# Patient Record
Sex: Female | Born: 1980 | Race: White | Hispanic: No | Smoking: Never smoker
Health system: Southern US, Community
[De-identification: ages and names within clinical notes are randomized; demographics above are authoritative.]

## PROBLEM LIST (undated history)

## (undated) DIAGNOSIS — R0600 Dyspnea, unspecified: Secondary | ICD-10-CM

## (undated) DIAGNOSIS — R32 Unspecified urinary incontinence: Secondary | ICD-10-CM

## (undated) DIAGNOSIS — E785 Hyperlipidemia, unspecified: Secondary | ICD-10-CM

## (undated) DIAGNOSIS — K219 Gastro-esophageal reflux disease without esophagitis: Secondary | ICD-10-CM

## (undated) DIAGNOSIS — R7303 Prediabetes: Secondary | ICD-10-CM

## (undated) DIAGNOSIS — N816 Rectocele: Secondary | ICD-10-CM

## (undated) DIAGNOSIS — F419 Anxiety disorder, unspecified: Secondary | ICD-10-CM

## (undated) DIAGNOSIS — E282 Polycystic ovarian syndrome: Secondary | ICD-10-CM

## (undated) DIAGNOSIS — N83202 Unspecified ovarian cyst, left side: Secondary | ICD-10-CM

## (undated) DIAGNOSIS — J45909 Unspecified asthma, uncomplicated: Secondary | ICD-10-CM

## (undated) DIAGNOSIS — D509 Iron deficiency anemia, unspecified: Secondary | ICD-10-CM

## (undated) DIAGNOSIS — E559 Vitamin D deficiency, unspecified: Secondary | ICD-10-CM

## (undated) DIAGNOSIS — E781 Pure hyperglyceridemia: Secondary | ICD-10-CM

## (undated) DIAGNOSIS — N8110 Cystocele, unspecified: Secondary | ICD-10-CM

## (undated) DIAGNOSIS — K449 Diaphragmatic hernia without obstruction or gangrene: Secondary | ICD-10-CM

## (undated) DIAGNOSIS — R600 Localized edema: Secondary | ICD-10-CM

## (undated) DIAGNOSIS — T7840XA Allergy, unspecified, initial encounter: Secondary | ICD-10-CM

## (undated) DIAGNOSIS — E786 Lipoprotein deficiency: Secondary | ICD-10-CM

## (undated) HISTORY — DX: Diaphragmatic hernia without obstruction or gangrene: K44.9

## (undated) HISTORY — DX: Unspecified urinary incontinence: R32

## (undated) HISTORY — DX: Prediabetes: R73.03

## (undated) HISTORY — DX: Hyperlipidemia, unspecified: E78.5

## (undated) HISTORY — PX: REDUCTION MAMMAPLASTY: SUR839

## (undated) HISTORY — DX: Gastro-esophageal reflux disease without esophagitis: K21.9

## (undated) HISTORY — DX: Localized edema: R60.0

## (undated) HISTORY — DX: Polycystic ovarian syndrome: E28.2

## (undated) HISTORY — DX: Dyspnea, unspecified: R06.00

## (undated) HISTORY — DX: Anxiety disorder, unspecified: F41.9

## (undated) HISTORY — DX: Cystocele, unspecified: N81.10

## (undated) HISTORY — DX: Lipoprotein deficiency: E78.6

## (undated) HISTORY — DX: Allergy, unspecified, initial encounter: T78.40XA

## (undated) HISTORY — DX: Vitamin D deficiency, unspecified: E55.9

## (undated) HISTORY — DX: Unspecified ovarian cyst, left side: N83.202

## (undated) HISTORY — DX: Iron deficiency anemia, unspecified: D50.9

## (undated) HISTORY — DX: Unspecified asthma, uncomplicated: J45.909

## (undated) HISTORY — DX: Pure hyperglyceridemia: E78.1

## (undated) HISTORY — DX: Rectocele: N81.6

---
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
## 1998-03-01 HISTORY — PX: BREAST REDUCTION SURGERY: SHX8

## 1998-08-25 ENCOUNTER — Other Ambulatory Visit: Admission: RE | Admit: 1998-08-25 | Discharge: 1998-08-25 | Payer: Self-pay | Admitting: Obstetrics and Gynecology

## 1999-02-13 ENCOUNTER — Encounter (INDEPENDENT_AMBULATORY_CARE_PROVIDER_SITE_OTHER): Payer: Self-pay | Admitting: Specialist

## 1999-02-13 ENCOUNTER — Other Ambulatory Visit: Admission: RE | Admit: 1999-02-13 | Discharge: 1999-02-13 | Payer: Self-pay | Admitting: Plastic Surgery

## 2002-06-26 ENCOUNTER — Encounter: Payer: Self-pay | Admitting: Obstetrics and Gynecology

## 2002-06-26 ENCOUNTER — Encounter: Admission: RE | Admit: 2002-06-26 | Discharge: 2002-06-26 | Payer: Self-pay | Admitting: Obstetrics and Gynecology

## 2003-05-27 ENCOUNTER — Other Ambulatory Visit: Admission: RE | Admit: 2003-05-27 | Discharge: 2003-05-27 | Payer: Self-pay | Admitting: Obstetrics and Gynecology

## 2003-06-12 ENCOUNTER — Encounter: Admission: RE | Admit: 2003-06-12 | Discharge: 2003-09-10 | Payer: Self-pay

## 2004-06-10 ENCOUNTER — Other Ambulatory Visit: Admission: RE | Admit: 2004-06-10 | Discharge: 2004-06-10 | Payer: Self-pay | Admitting: Obstetrics and Gynecology

## 2004-07-07 ENCOUNTER — Ambulatory Visit (HOSPITAL_COMMUNITY): Admission: RE | Admit: 2004-07-07 | Discharge: 2004-07-07 | Payer: Self-pay | Admitting: Obstetrics and Gynecology

## 2004-07-20 ENCOUNTER — Ambulatory Visit (HOSPITAL_COMMUNITY): Admission: RE | Admit: 2004-07-20 | Discharge: 2004-07-20 | Payer: Self-pay | Admitting: Obstetrics and Gynecology

## 2005-06-10 ENCOUNTER — Other Ambulatory Visit: Admission: RE | Admit: 2005-06-10 | Discharge: 2005-06-10 | Payer: Self-pay | Admitting: Obstetrics and Gynecology

## 2006-06-13 ENCOUNTER — Other Ambulatory Visit: Admission: RE | Admit: 2006-06-13 | Discharge: 2006-06-13 | Payer: Self-pay | Admitting: Obstetrics and Gynecology

## 2007-05-15 ENCOUNTER — Inpatient Hospital Stay (HOSPITAL_COMMUNITY): Admission: AD | Admit: 2007-05-15 | Discharge: 2007-05-17 | Payer: Self-pay | Admitting: Obstetrics and Gynecology

## 2007-11-03 ENCOUNTER — Other Ambulatory Visit: Admission: RE | Admit: 2007-11-03 | Discharge: 2007-11-03 | Payer: Self-pay | Admitting: Obstetrics and Gynecology

## 2007-11-17 ENCOUNTER — Encounter: Admission: RE | Admit: 2007-11-17 | Discharge: 2007-11-17 | Payer: Self-pay | Admitting: Obstetrics and Gynecology

## 2008-05-06 ENCOUNTER — Ambulatory Visit: Payer: Self-pay | Admitting: Internal Medicine

## 2008-11-08 ENCOUNTER — Other Ambulatory Visit: Admission: RE | Admit: 2008-11-08 | Discharge: 2008-11-08 | Payer: Self-pay | Admitting: Obstetrics and Gynecology

## 2009-08-08 ENCOUNTER — Inpatient Hospital Stay (HOSPITAL_COMMUNITY): Admission: AD | Admit: 2009-08-08 | Discharge: 2009-08-08 | Payer: Self-pay | Admitting: Obstetrics and Gynecology

## 2009-08-08 ENCOUNTER — Ambulatory Visit: Payer: Self-pay | Admitting: Gynecology

## 2009-11-04 ENCOUNTER — Inpatient Hospital Stay (HOSPITAL_COMMUNITY): Admission: AD | Admit: 2009-11-04 | Discharge: 2009-11-06 | Payer: Self-pay | Admitting: Obstetrics and Gynecology

## 2009-11-13 ENCOUNTER — Emergency Department (HOSPITAL_COMMUNITY): Admission: EM | Admit: 2009-11-13 | Discharge: 2009-11-13 | Payer: Self-pay | Admitting: Emergency Medicine

## 2010-02-17 ENCOUNTER — Other Ambulatory Visit
Admission: RE | Admit: 2010-02-17 | Discharge: 2010-02-17 | Payer: Self-pay | Source: Home / Self Care | Admitting: Obstetrics and Gynecology

## 2010-03-22 ENCOUNTER — Encounter: Payer: Self-pay | Admitting: Obstetrics and Gynecology

## 2010-05-14 LAB — COMPREHENSIVE METABOLIC PANEL
AST: 17 U/L (ref 0–37)
Albumin: 3.2 g/dL — ABNORMAL LOW (ref 3.5–5.2)
Alkaline Phosphatase: 107 U/L (ref 39–117)
Chloride: 108 mEq/L (ref 96–112)
GFR calc Af Amer: >60 mL/min (ref >60)
Potassium: 3.5 mEq/L (ref 3.5–5.1)
Total Bilirubin: 0.5 mg/dL (ref 0.3–1.2)

## 2010-05-14 LAB — DIFFERENTIAL
Basophils Absolute: 0.1 10*3/uL (ref 0.0–0.1)
Basophils Relative: 1 % (ref 0–1)
Eosinophils Relative: 2 % (ref 0–5)
Lymphocytes Relative: 11 % — ABNORMAL LOW (ref 12–46)
Monocytes Absolute: 1.1 10*3/uL — ABNORMAL HIGH (ref 0.1–1.0)

## 2010-05-14 LAB — CBC
HCT: 36.8 % (ref 36.0–46.0)
MCV: 90.3 fL (ref 78.0–100.0)
Platelets: 183 10*3/uL (ref 150–400)
Platelets: 150 10*3/uL (ref 150–400)
RBC: 4.47 MIL/uL (ref 3.87–5.11)
RBC: 3.73 MIL/uL — ABNORMAL LOW (ref 3.87–5.11)
RDW: 15.6 % — ABNORMAL HIGH (ref 11.5–15.5)
WBC: 10.2 10*3/uL (ref 4.0–10.5)
WBC: 10.8 10*3/uL — ABNORMAL HIGH (ref 4.0–10.5)
WBC: 9.9 10*3/uL (ref 4.0–10.5)

## 2010-05-14 LAB — RPR: RPR Ser Ql: NONREACTIVE

## 2010-05-14 LAB — WET PREP, GENITAL: Yeast Wet Prep HPF POC: NONE SEEN

## 2010-05-14 LAB — URINALYSIS, ROUTINE W REFLEX MICROSCOPIC
Bilirubin Urine: NEGATIVE
Glucose, UA: NEGATIVE mg/dL
Ketones, ur: NEGATIVE mg/dL
pH: 6.5 (ref 5.0–8.0)

## 2010-05-14 LAB — URINE MICROSCOPIC-ADD ON

## 2010-05-18 LAB — WET PREP, GENITAL
Clue Cells Wet Prep HPF POC: NONE SEEN
Trich, Wet Prep: NONE SEEN
Yeast Wet Prep HPF POC: NONE SEEN

## 2010-07-14 NOTE — Op Note (Signed)
NAMEZYNIAH, FERRAIOLO      ACCOUNT NO.:  [id number]   MEDICAL RECORD NO.:  [id number]          PATIENT TYPE:  INP   LOCATION:  9170                          FACILITY:  [facility]   PHYSICIAN:  Charles A. Delcambre, MDDATE OF BIRTH:  07/29/1980   DATE OF PROCEDURE:  05/15/2007  DATE OF DISCHARGE:                               OPERATIVE REPORT   __________  Clear fluid was noted.  She was __________  Pitocin.  __________  complete, roughly 10 __________  and delivery record.  At  that time she was complete, complete, and +1 to +2 in occipitoposterior.  The fetal heart rate was 140s __________  some variables __________.  She pushed to a +2 station to +3 station, but decelerations were  occurring which were progressively worse with pushing __________ .  For  this reason, __________.  __________  shoulders __________.  The bladder  had been emptied __________ .  The remainder of the baby __________ .  __________  2-0 Vicryl.  __________ .      Charles A. Sydnee Cabal, MD  Electronically Signed     CAD/MEDQ  D:  05/15/2007  T:  05/16/2007  Job:  [id number]

## 2010-07-14 NOTE — H&P (Signed)
Alexis Jones, Alexis Jones      ACCOUNT NO.:  [id number]   MEDICAL RECORD NO.:  [id number]          PATIENT TYPE:  INP   LOCATION:                                [facility]  [facility]   PHYSICIAN:  Charles A. Delcambre, MD    DATE OF BIRTH:   DATE OF ADMISSION:  05/15/2007  DATE OF DISCHARGE:                              HISTORY & PHYSICAL   REASON FOR ADMISSION:  The patient to be admitted on May 17, 2007, for  Cervidil induction at 9 days overdue.   HISTORY OF PRESENT ILLNESS:  She is a 30 year old gravida 2, para 0-0-1-  0.  Prenatal labs illnesses consist of blood type O+, antibody screen  negative, VDRL nonreactive, rubella immune, hepatitis B surface antigen  negative, HIV nonreactive.  TSH 0.57, normal.  Urinalysis normal.  Pap  negative April 2008. Gonorrhea and Chlamydia October 26, 2006, both  negative.  Cystic fibrosis negative in 2006.  She declined first  trimester screen.  She also declined quad screen.  One-hour glucose 84  at 28 weeks, hemoglobin 11.6 at 28 weeks.  Syphilis negative at 28  weeks.  HIV negative at 36 weeks.  Group B strep negative at 36 weeks.   PAST MEDICAL HISTORY:  Asthma in childhood, not currently.   PAST SURGICAL HISTORY:  Breast reduction December 2000.   MEDICATIONS:  Prenatal vitamins, iron, recently Z-Pak completed for  bronchitis.   ALLERGIES:  NKDA   SOCIAL HISTORY:  No tobacco, ethanol or drug use.  Patient is married  and in a monogamous relationship with her husband.   FAMILY HISTORY:  Paternal grandfather lung cancer.  Maternal grandmother  ovarian cancer.  Heart disease in paternal grandfather and father,  thrrombophlebitis in maternal grandfather, COPD maternal grandfather,  and lung cancer in maternal grandfather.   REVIEW OF SYSTEMS:  Denies ruptured membranes, regular contractions or  vaginal bleeding, headaches, vision changes, right upper quadrant pain,  chest pain, shortness of breath, wheezing, diarrhea, constipation.   PHYSICAL EXAMINATION:  GENERAL APPEARANCE:  Alert and oriented x3.  VITAL SIGNS:  Blood pressure 112/72, weight 217 pounds, respirations 16,  pulse 90.  CORONARY:  Regular rate and rhythm, 2/6 systolic ejection murmur left  sternal border.  LUNGS: Clear bilaterally.  ABDOMEN:  Fundal height 40 cm. Fetal heart rate 145, fundal height 40  cm.  PELVIC:  Cervix fingertip, posterior, soft, 50% effaced and intact.  EXTREMITIES:  Moderate edema to the upper calves, symmetrical  bilaterally and nontender.   ASSESSMENT:  The patient to be 41 weeks 2 days upon admission, to be  induced for post dates.  She gives informed consent.   PLAN:  Cervidil will be placed overnight, Ambien 10 mg if desired.  If  unable to place Cervidil secondary to contractions, will run Pitocin at  2 mU per minute overnight.  Pitocin in the morning at 0700.  Otherwise  Cervidil will be left until about 0800, thereafter removed and possible  artificial rupture of membranes if she is 2-3 cm dilated.  She gives  informed consent and we will proceed as outlined.      Charles A. Sydnee Cabal, MD  Electronically Signed     CAD/MEDQ  D:  05/10/2007  T:  05/11/2007  Job:  [id number]

## 2010-07-17 NOTE — Op Note (Signed)
Alexis Jones, Alexis Jones      ACCOUNT NO.:  [id number]   MEDICAL RECORD NO.:  [id number]          PATIENT TYPE:  INP   LOCATION:  9124                          [facility]ACILITY:  [facility]   PHYSICIAN:  Charles A. Delcambre, MDDATE OF BIRTH:  04/01/1980   DATE OF PROCEDURE:  DATE OF DISCHARGE:  05/17/2007                               OPERATIVE REPORT   This is a redictation in that the first dictation had a lot of static  and was not transcribable except in some infrequent areas giving an  inadequate operative vaginal delivery note. I wish to redictate as  follows to the best of my knowledge.   [name] came in in spontaneous labor, progressed on through second  stage where she developed acutely deep decelerations lasting 2 minutes  to the 70s with several contractions were she was pushing.  She gave  informed consent at that time for vacuum delivery, accepted risks of  subgaleal hemorrhage, excoriation on the baby's head, failure of vacuum  extraction leading to cesarean section and the indication being the  nonreassuring fetal heart rate.  She gave informed consent. She was  complete complete,  +2 at that time. The bladder was emptied from her  Foley that had just been discontinued,  epidural was then placed. The  baby was right occiput anterior. A kiwi vacuum was used,  placed and  pressure brought up to the mid green zone in one contraction.  She had  three pushes and the baby was delivered without difficulty. The  remainder of the delivery proceeded without complication. The shoulders  delivered spontaneously, baby had a vigorous cry. It was 8 pounds 1  ounce.  Apgars of 8 and 9. As this is a redictation, please verify  weight, length and Apgars with the medical record chart. I do not recall  if cord gases were sent. Length was 20-3/4 to my memory. The placenta  was manually expressed and sent for cord blood donation to [facility]. The father of the baby cut the cord. A  small second-degree  laceration intravaginally was noted, repaired with 3-0 Vicryl running  locking suture. Estimated blood loss was 400 mL. Mother and baby are  stable at time of discharge to postpartum.      Charles A. Sydnee Cabal, MD  Electronically Signed     CAD/MEDQ  D:  05/18/2007  T:  05/18/2007  Job:  [id number]

## 2010-11-23 LAB — CBC
Platelets: 200
Platelets: 206
RBC: 3.63 — ABNORMAL LOW
RDW: 15
WBC: 16.2 — ABNORMAL HIGH

## 2010-11-23 LAB — SYPHILIS: RPR W/REFLEX TO RPR TITER AND TREPONEMAL ANTIBODIES, TRADITIONAL SCREENING AND DIAGNOSIS ALGORITHM: RPR Ser Ql: NONREACTIVE

## 2011-02-09 ENCOUNTER — Other Ambulatory Visit (HOSPITAL_COMMUNITY)
Admission: RE | Admit: 2011-02-09 | Discharge: 2011-02-09 | Disposition: A | Discharge status: Home / Self Care | Payer: BC Managed Care – PPO | Source: Ambulatory Visit | Attending: Obstetrics and Gynecology | Admitting: Obstetrics and Gynecology

## 2011-02-09 DIAGNOSIS — Z01419 Encounter for gynecological examination (general) (routine) without abnormal findings: Secondary | ICD-10-CM | POA: Insufficient documentation

## 2012-02-28 ENCOUNTER — Other Ambulatory Visit (HOSPITAL_COMMUNITY)
Admission: RE | Admit: 2012-02-28 | Discharge: 2012-02-28 | Disposition: A | Discharge status: Home / Self Care | Payer: BC Managed Care – PPO | Source: Ambulatory Visit | Attending: Obstetrics and Gynecology | Admitting: Obstetrics and Gynecology

## 2012-02-28 ENCOUNTER — Other Ambulatory Visit: Payer: Self-pay | Admitting: Obstetrics and Gynecology

## 2012-02-28 DIAGNOSIS — Z113 Encounter for screening for infections with a predominantly sexual mode of transmission: Secondary | ICD-10-CM | POA: Insufficient documentation

## 2012-02-28 DIAGNOSIS — Z01419 Encounter for gynecological examination (general) (routine) without abnormal findings: Secondary | ICD-10-CM | POA: Insufficient documentation

## 2012-02-28 DIAGNOSIS — N760 Acute vaginitis: Secondary | ICD-10-CM | POA: Insufficient documentation

## 2013-03-01 HISTORY — PX: LIPOMA EXCISION: SHX5283

## 2013-03-22 ENCOUNTER — Other Ambulatory Visit (HOSPITAL_COMMUNITY)
Admission: RE | Admit: 2013-03-22 | Discharge: 2013-03-22 | Disposition: A | Discharge status: Home / Self Care | Payer: BC Managed Care – PPO | Source: Ambulatory Visit | Attending: Obstetrics and Gynecology | Admitting: Obstetrics and Gynecology

## 2013-03-22 ENCOUNTER — Other Ambulatory Visit: Payer: Self-pay | Admitting: Obstetrics and Gynecology

## 2013-03-22 DIAGNOSIS — Z01419 Encounter for gynecological examination (general) (routine) without abnormal findings: Secondary | ICD-10-CM | POA: Insufficient documentation

## 2013-03-22 DIAGNOSIS — Z1151 Encounter for screening for human papillomavirus (HPV): Secondary | ICD-10-CM | POA: Insufficient documentation

## 2013-04-09 ENCOUNTER — Ambulatory Visit (INDEPENDENT_AMBULATORY_CARE_PROVIDER_SITE_OTHER): Payer: BC Managed Care – PPO | Admitting: Surgery

## 2013-04-17 ENCOUNTER — Ambulatory Visit (INDEPENDENT_AMBULATORY_CARE_PROVIDER_SITE_OTHER): Payer: BC Managed Care – PPO | Admitting: Surgery

## 2013-04-17 ENCOUNTER — Encounter (INDEPENDENT_AMBULATORY_CARE_PROVIDER_SITE_OTHER): Payer: Self-pay | Admitting: Surgery

## 2013-04-17 VITALS — BP 110/70 | HR 70 | Temp 98.7°F | Resp 14 | Ht 66.0 in | Wt 211.6 lb

## 2013-04-17 DIAGNOSIS — M7989 Other specified soft tissue disorders: Secondary | ICD-10-CM

## 2013-04-17 DIAGNOSIS — M799 Soft tissue disorder, unspecified: Secondary | ICD-10-CM

## 2013-04-17 NOTE — Patient Instructions (Signed)

## 2013-04-17 NOTE — Progress Notes (Signed)
General Surgery [facility]  Chief Complaint  Patient presents with  . New Evaluation    mass on back - referral from Dr. Christophe Louis, [facility]    HISTORY: Patient is a 33 year old female referred by her gynecologist with a soft tissue mass of the left posterior shoulder. Mass arises along the edge of the left trapezius muscle. Patient states that it is been present for several years but over the past 12-18 months has increased significantly in size. She suspects it is double the size it was 1 year ago. He does not cause any significant discomfort. It does not limit her range of motion. She denies any radiation of pain from the neck or into the upper extremity. Patient has had no such lesions previously excised. She denies any history of trauma. Patient presents at this time for consideration for surgical excision for definitive diagnosis and management.  History reviewed. No pertinent past medical history.  Current Outpatient Prescriptions  Medication Sig Dispense Refill  . levonorgestrel (MIRENA) 20 MCG/24HR IUD 1 each by Intrauterine route once.       No current facility-administered medications for this visit.    No Known Allergies  Family History  Problem Relation Age of Onset  . Breast cancer Maternal Aunt   . Ovarian cancer Maternal Grandmother   . Skin cancer Maternal Grandfather   . Lung cancer Maternal Grandfather     History   Social History  . Marital Status: Married    Spouse Name: N/A    Number of Children: N/A  . Years of Education: N/A   Social History Main Topics  . Smoking status: Never Smoker   . Smokeless tobacco: None  . Alcohol Use: No  . Drug Use: No  . Sexual Activity: None   Other Topics Concern  . None   Social History Narrative  . None    REVIEW OF SYSTEMS - PERTINENT POSITIVES ONLY: Increase in size over past 12-18 months as noted above. Denies pain. Denies limitation in range of motion.  EXAM: Filed Vitals:   04/17/13 1539  BP: 110/70  Pulse: 70  Temp: 98.7 F (37.1 C)  Resp: 14    GENERAL: well-developed, well-nourished, no acute distress HEENT: normocephalic; pupils equal and reactive; sclerae clear; dentition good; mucous membranes moist NECK:  symmetric on extension; no palpable anterior or posterior cervical lymphadenopathy; no supraclavicular masses; no tenderness CHEST: clear to auscultation bilaterally without rales, rhonchi, or wheezes; soft tissue mass left shoulder along edge of trapezius muscle measuring 6 x 6 x 3 cm with relatively well-defined margins, somewhat mobile CARDIAC: regular rate and rhythm without significant murmur; peripheral pulses are full EXT:  non-tender without edema; no deformity NEURO: no gross focal deficits; no sign of tremor   LABORATORY RESULTS: See [facility] ([facility]-Epic) for most recent results  RADIOLOGY RESULTS: See [facility] (CHL-Epic) for most recent results  IMPRESSION: Soft tissue mass left posterior shoulder, 6 x 6 x 3 cm, likely lipoma  PLAN: Patient and I discussed the indications for surgical removal. Given that a significant increase in size has occurred over the past year, I believe this should be removed for definitive diagnosis. It is likely benign. It does not appear to involve any underlying neural structures.  Patient and I discussed surgical excision as an outpatient procedure. We discussed the location of the surgical incision. We discussed the cosmetic results to be anticipated. We discussed the possibility of seroma formation. She understands and wishes to proceed.  The risks and benefits of the procedure have been discussed at length with the patient.  The patient understands the proposed procedure, potential alternative treatments, and the course of recovery to be expected.  All of the patient's questions have been answered at this time.  The patient wishes to proceed with surgery.  Earnstine Regal, MD, [facility]  Primary Care Physician: Catha Brow., MD

## 2013-04-27 ENCOUNTER — Telehealth (INDEPENDENT_AMBULATORY_CARE_PROVIDER_SITE_OTHER): Payer: Self-pay

## 2013-04-27 ENCOUNTER — Other Ambulatory Visit (INDEPENDENT_AMBULATORY_CARE_PROVIDER_SITE_OTHER): Payer: Self-pay | Admitting: *Deleted

## 2013-04-27 DIAGNOSIS — D1739 Benign lipomatous neoplasm of skin and subcutaneous tissue of other sites: Secondary | ICD-10-CM

## 2013-04-27 MED ORDER — HYDROCODONE-ACETAMINOPHEN 5-325 MG PO TABS: 1.0000 | ORAL_TABLET | ORAL | Status: DC | PRN

## 2013-04-27 NOTE — Telephone Encounter (Signed)
Pt home doing well. PO appt made. 

## 2013-05-08 ENCOUNTER — Telehealth (INDEPENDENT_AMBULATORY_CARE_PROVIDER_SITE_OTHER): Payer: Self-pay | Admitting: *Deleted

## 2013-05-08 NOTE — Telephone Encounter (Signed)
Pt called, incision is painful to touch/red around posterior/pocket of fluid.I spoke to [place] and she said have pt come in 3.11.15 9:15 for 9:30 apt with TG.. Called left pt a message at work to call and verify that she is able to keep this appt.Marland Kitchenjkw

## 2013-05-09 ENCOUNTER — Ambulatory Visit (INDEPENDENT_AMBULATORY_CARE_PROVIDER_SITE_OTHER): Payer: BC Managed Care – PPO | Admitting: Surgery

## 2013-05-09 ENCOUNTER — Encounter (INDEPENDENT_AMBULATORY_CARE_PROVIDER_SITE_OTHER): Payer: Self-pay | Admitting: Surgery

## 2013-05-09 VITALS — BP 104/70 | HR 80 | Temp 98.5°F | Resp 14 | Ht 66.0 in | Wt 210.8 lb

## 2013-05-09 DIAGNOSIS — M7989 Other specified soft tissue disorders: Secondary | ICD-10-CM

## 2013-05-09 DIAGNOSIS — M799 Soft tissue disorder, unspecified: Secondary | ICD-10-CM

## 2013-05-09 NOTE — Progress Notes (Signed)
General Surgery [facility]  Chief Complaint  Patient presents with  . Routine Post Op    excision lipoma left shoulder 04/27/2013    HISTORY: Patient is a 33 year old female who underwent excision of a 7.6 cm lipoma from the left shoulder. She returns for her first postoperative visit.  EXAM: Wound is healing nicely. No sign of infection. There is a small seroma. 10 cc of serous fluid is aspirated through a 21-gauge needle. Band-Aid is placed as dressing.  IMPRESSION: Status post excisional lipoma left shoulder, postoperative small seroma.  PLAN: Patient will begin applying topical creams to the incision. She will return for surgical care as needed. Final pathology report was given to the patient.  Alexis Regal, MD, [facility]   Visit Diagnoses: 1. Soft tissue mass, left shoulder

## 2013-05-09 NOTE — Patient Instructions (Signed)
  COCOA BUTTER & VITAMIN E CREAM  ([employer] or other brand)  Apply cocoa butter/vitamin E cream to your incision 2 - 3 times daily.  Massage cream into incision for one minute with each application.  Use sunscreen (50 SPF or higher) for first 6 months after surgery if area is exposed to sun.  You may substitute Mederma or other scar reducing creams as desired.   

## 2013-05-22 ENCOUNTER — Encounter (INDEPENDENT_AMBULATORY_CARE_PROVIDER_SITE_OTHER): Payer: BC Managed Care – PPO | Admitting: Surgery

## 2013-05-30 ENCOUNTER — Encounter (INDEPENDENT_AMBULATORY_CARE_PROVIDER_SITE_OTHER): Payer: BC Managed Care – PPO | Admitting: Surgery

## 2014-03-27 ENCOUNTER — Other Ambulatory Visit (HOSPITAL_COMMUNITY)
Admission: RE | Admit: 2014-03-27 | Discharge: 2014-03-27 | Disposition: A | Discharge status: Home / Self Care | Payer: BC Managed Care – PPO | Source: Ambulatory Visit | Attending: Obstetrics and Gynecology | Admitting: Obstetrics and Gynecology

## 2014-03-27 ENCOUNTER — Other Ambulatory Visit: Payer: Self-pay | Admitting: Obstetrics and Gynecology

## 2014-03-27 DIAGNOSIS — Z01411 Encounter for gynecological examination (general) (routine) with abnormal findings: Secondary | ICD-10-CM | POA: Insufficient documentation

## 2014-03-29 LAB — CYTOLOGY - PAP

## 2015-04-10 ENCOUNTER — Other Ambulatory Visit: Payer: Self-pay | Admitting: Obstetrics and Gynecology

## 2015-04-10 ENCOUNTER — Other Ambulatory Visit (HOSPITAL_COMMUNITY)
Admission: RE | Admit: 2015-04-10 | Discharge: 2015-04-10 | Disposition: A | Discharge status: Home / Self Care | Payer: BC Managed Care – PPO | Source: Ambulatory Visit | Attending: Obstetrics and Gynecology | Admitting: Obstetrics and Gynecology

## 2015-04-10 DIAGNOSIS — Z01419 Encounter for gynecological examination (general) (routine) without abnormal findings: Secondary | ICD-10-CM | POA: Diagnosis present

## 2015-04-14 LAB — CYTOLOGY - PAP

## 2015-10-03 ENCOUNTER — Other Ambulatory Visit: Payer: Self-pay | Admitting: Surgery

## 2016-04-27 ENCOUNTER — Other Ambulatory Visit: Payer: Self-pay | Admitting: Obstetrics and Gynecology

## 2016-04-27 ENCOUNTER — Other Ambulatory Visit (HOSPITAL_COMMUNITY)
Admission: RE | Admit: 2016-04-27 | Discharge: 2016-04-27 | Disposition: A | Discharge status: Home / Self Care | Payer: BC Managed Care – PPO | Source: Ambulatory Visit | Attending: Obstetrics and Gynecology | Admitting: Obstetrics and Gynecology

## 2016-04-27 DIAGNOSIS — Z01419 Encounter for gynecological examination (general) (routine) without abnormal findings: Secondary | ICD-10-CM | POA: Insufficient documentation

## 2016-04-27 DIAGNOSIS — Z1151 Encounter for screening for human papillomavirus (HPV): Secondary | ICD-10-CM | POA: Insufficient documentation

## 2016-04-30 LAB — CYTOLOGY - PAP
Diagnosis: NEGATIVE
HPV (WINDOPATH): NOT DETECTED

## 2016-10-12 ENCOUNTER — Other Ambulatory Visit: Payer: Self-pay | Admitting: Physician Assistant

## 2016-10-12 DIAGNOSIS — R1310 Dysphagia, unspecified: Secondary | ICD-10-CM

## 2016-10-14 ENCOUNTER — Ambulatory Visit
Admission: RE | Admit: 2016-10-14 | Discharge: 2016-10-14 | Disposition: A | Discharge status: Home / Self Care | Payer: BC Managed Care – PPO | Source: Ambulatory Visit | Attending: Physician Assistant | Admitting: Physician Assistant

## 2016-10-14 DIAGNOSIS — R1310 Dysphagia, unspecified: Secondary | ICD-10-CM

## 2018-04-24 ENCOUNTER — Other Ambulatory Visit: Payer: Self-pay

## 2018-04-24 ENCOUNTER — Ambulatory Visit: Payer: BC Managed Care – PPO | Admitting: Obstetrics & Gynecology

## 2018-04-24 ENCOUNTER — Other Ambulatory Visit (HOSPITAL_COMMUNITY)
Admission: RE | Admit: 2018-04-24 | Discharge: 2018-04-24 | Disposition: A | Discharge status: Home / Self Care | Payer: BC Managed Care – PPO | Source: Ambulatory Visit | Attending: Obstetrics & Gynecology | Admitting: Obstetrics & Gynecology

## 2018-04-24 ENCOUNTER — Telehealth: Payer: Self-pay | Admitting: Obstetrics & Gynecology

## 2018-04-24 ENCOUNTER — Encounter: Payer: Self-pay | Admitting: Obstetrics & Gynecology

## 2018-04-24 VITALS — BP 118/62 | HR 88 | Resp 16 | Ht 66.0 in | Wt 254.4 lb

## 2018-04-24 DIAGNOSIS — Z124 Encounter for screening for malignant neoplasm of cervix: Secondary | ICD-10-CM | POA: Diagnosis not present

## 2018-04-24 DIAGNOSIS — T8332XA Displacement of intrauterine contraceptive device, initial encounter: Secondary | ICD-10-CM | POA: Diagnosis not present

## 2018-04-24 DIAGNOSIS — Z01419 Encounter for gynecological examination (general) (routine) without abnormal findings: Secondary | ICD-10-CM

## 2018-04-24 DIAGNOSIS — Z23 Encounter for immunization: Secondary | ICD-10-CM

## 2018-04-24 DIAGNOSIS — Z0000 Encounter for general adult medical examination without abnormal findings: Secondary | ICD-10-CM | POA: Diagnosis not present

## 2018-04-24 MED ORDER — ESCITALOPRAM OXALATE 10 MG PO TABS: 10.0000 mg | ORAL_TABLET | Freq: Every day | ORAL | 4 refills | Status: DC

## 2018-04-24 MED ORDER — OMEPRAZOLE 40 MG PO CPDR: 40.0000 mg | DELAYED_RELEASE_CAPSULE | Freq: Every day | ORAL | 3 refills | Status: DC

## 2018-04-24 NOTE — Telephone Encounter (Signed)
Call placed to patient to review benefits to review benefits for scheduled ultrasound appointment. Left voicemail message requesting a return call.

## 2018-04-24 NOTE — Progress Notes (Signed)
Patient scheduled while in office for PUS on 04/27/18 at 9am, consult to follow at 9:30am with Dr. Quincy Simmonds. Patient verbalizes understanding and is agreeable.

## 2018-04-24 NOTE — Progress Notes (Signed)
38 y.o. [id number] Married White or Caucasian female here for new patient annual exam.  Has Mirena IUD for contraception.  Occ has some dischage.  H/O PCOS so never had regular cycles.  Was on Clomid for pregnancies.  H/O GERD.  Saw Noel Redmon.  Had barium swallow done showing hiatal hernia and reflux.  Having issues with urinary leakage with coughing, lifting, sneezing and squatting.  Sometimes feels pressure as well.  Occasionally has urinary urgency and sometimes can't get to bathroom fast enough.  Had two vaginal deliveries with infants >8 pounds.   No LMP recorded. (Menstrual status: IUD).          Sexually active: Yes.    The current method of family planning is IUD. Mirena placed 05/05/15.   Exercising: No.   Smoker:  no  Health Maintenance: Pap:  04/27/16 neg. HR HPV:neg  History of abnormal Pap:  no MMG:  Never TDaP:  Grad school Screening Labs: here today    reports that she has never smoked. She has never used smokeless tobacco. She reports that she does not drink alcohol or use drugs.  Past Medical History:  Diagnosis Date  . Anxiety   . Urinary incontinence     Past Surgical History:  Procedure Laterality Date  . BREAST REDUCTION SURGERY  2000  . LIPOMA EXCISION  2015   left shoulder lipoma removed (2)  . TEAR DUCT PROBING  1981    Current Outpatient Medications  Medication Sig Dispense Refill  . escitalopram (LEXAPRO) 10 MG tablet Take 10 mg by mouth daily.    [name] ibuprofen (ADVIL,MOTRIN) 200 MG tablet Take 200 mg by mouth every 8 (eight) hours as needed.    [name] levonorgestrel (MIRENA) 20 MCG/24HR IUD 1 each by Intrauterine route once. Placed 05/05/15    . omeprazole (PRILOSEC) 40 MG capsule Take 1 capsule by mouth daily.    [name] spironolactone (ALDACTONE) 100 MG tablet Take 1 tablet by mouth daily.     No current facility-administered medications for this visit.     Family History  Problem Relation Age of Onset  . Cancer Father        prostate cancer  . Mitral  valve prolapse Father   . Breast cancer Maternal Aunt   . Ovarian cancer Maternal Grandmother   . Skin cancer Maternal Grandfather   . Lung cancer Maternal Grandfather     Review of Systems  All other systems reviewed and are negative.   Exam:   BP 118/62 (BP Location: Right Arm, Patient Position: Sitting, Cuff Size: Large)   Pulse 88   Resp 16   Ht 5\' 6"  (1.676 m)   Wt 254 lb 6.4 oz (115.4 kg)   BMI 41.06 kg/m   Height:   Height: 5\' 6"  (167.6 cm)  Ht Readings from Last 3 Encounters:  04/24/18 5\' 6"  (1.676 m)  05/09/13 5\' 6"  (1.676 m)  04/17/13 5\' 6"  (1.676 m)    General appearance: alert, cooperative and appears stated age Head: Normocephalic, without obvious abnormality, atraumatic Neck: no adenopathy, supple, symmetrical, trachea midline and thyroid normal to inspection and palpation Lungs: clear to auscultation bilaterally Breasts: normal appearance, no masses or tenderness Heart: regular rate and rhythm Abdomen: soft, non-tender; bowel sounds normal; no masses,  no organomegaly Extremities: extremities normal, atraumatic, no cyanosis or edema Skin: Skin color, texture, turgor normal. No rashes or lesions Lymph nodes: Cervical, supraclavicular, and axillary nodes normal. No abnormal inguinal nodes palpated Neurologic: Grossly normal   Pelvic: External  genitalia:  no lesions              Urethra:  normal appearing urethra with no masses, tenderness or lesions              Bartholins and Skenes: normal                 Vagina: normal appearing vagina with normal color and discharge, no lesions, second degree cystocele              Cervix: no lesions, cannot see IUD string today              Pap taken: Yes.   Bimanual Exam:  Uterus:  normal size, contour, position, consistency, mobility, non-tender              Adnexa: normal adnexa and no mass, fullness, tenderness               Rectovaginal: Confirms               Anus:  normal sphincter tone, no lesions  Chaperone  was present for exam.  A:  Well Woman with normal exam H/O PCOS SUI with occasional urgency Second degree cystocele noted on exam today Hirsutism, on spironolactone H/o ovarian cancer in her MGM (mother had negative genetic testing)  P:   Mammogram guidelines reviewed.  Will start this year due to upcoming birthday pap smear obtained.  Pt desires yearly.  Had neg HR HPV 2018 Return for PUS She will return to see Dr. Quincy Simmonds for consultation regarding sling and possible anterior repair CBC, Lipids, TSH, Vit D, CMP, HbA1C obtained today RF for omeprazole 40mg  daily.  390/4RF (has BE in past) RF for Lexapro 10mg  daily.  #90/4RF Tdap updated today Return annually or prn

## 2018-04-25 LAB — CYTOLOGY - PAP: Diagnosis: NEGATIVE

## 2018-04-25 LAB — COMPREHENSIVE METABOLIC PANEL
ALT: 16 IU/L (ref 0–32)
AST: 15 IU/L (ref 0–40)
Albumin/Globulin Ratio: 1.6 (ref 1.2–2.2)
Albumin: 4.1 g/dL (ref 3.8–4.8)
Alkaline Phosphatase: 67 IU/L (ref 39–117)
BUN/Creatinine Ratio: 10 (ref 9–23)
BUN: 8 mg/dL (ref 6–20)
Bilirubin Total: <0.2 mg/dL (ref 0.0–1.2)
CO2: 23 mmol/L (ref 20–29)
Calcium: 8.7 mg/dL (ref 8.7–10.2)
Chloride: 104 mmol/L (ref 96–106)
Creatinine, Ser: 0.78 mg/dL (ref 0.57–1.00)
GFR calc Af Amer: 111 mL/min/{1.73_m2} (ref >59)
GFR calc non Af Amer: 96 mL/min/{1.73_m2} (ref >59)
Globulin, Total: 2.6 g/dL (ref 1.5–4.5)
Glucose: 68 mg/dL (ref 65–99)
Potassium: 3.9 mmol/L (ref 3.5–5.2)
Sodium: 139 mmol/L (ref 134–144)
Total Protein: 6.7 g/dL (ref 6.0–8.5)

## 2018-04-25 LAB — CBC
Hematocrit: 34 % (ref 34.0–46.6)
Hemoglobin: 10.5 g/dL — ABNORMAL LOW (ref 11.1–15.9)
MCH: 23.5 pg — ABNORMAL LOW (ref 26.6–33.0)
MCHC: 30.9 g/dL — ABNORMAL LOW (ref 31.5–35.7)
MCV: 76 fL — ABNORMAL LOW (ref 79–97)
PLATELETS: 307 10*3/uL (ref 150–450)
RBC: 4.46 x10E6/uL (ref 3.77–5.28)
RDW: 15.1 % (ref 11.7–15.4)
WBC: 6.6 10*3/uL (ref 3.4–10.8)

## 2018-04-25 LAB — HEMOGLOBIN A1C
Est. average glucose Bld gHb Est-mCnc: 123 mg/dL
Hgb A1c MFr Bld: 5.9 % — ABNORMAL HIGH (ref 4.8–5.6)

## 2018-04-25 LAB — LIPID PANEL
CHOLESTEROL TOTAL: 176 mg/dL (ref 100–199)
Chol/HDL Ratio: 7 ratio — ABNORMAL HIGH (ref 0.0–4.4)
HDL: 25 mg/dL — ABNORMAL LOW (ref >39)
LDL Calculated: 104 mg/dL — ABNORMAL HIGH (ref 0–99)
Triglycerides: 234 mg/dL — ABNORMAL HIGH (ref 0–149)
VLDL Cholesterol Cal: 47 mg/dL — ABNORMAL HIGH (ref 5–40)

## 2018-04-25 LAB — TSH: TSH: 2.67 u[IU]/mL (ref 0.450–4.500)

## 2018-04-25 LAB — VITAMIN D 25 HYDROXY (VIT D DEFICIENCY, FRACTURES): Vit D, 25-Hydroxy: 19.4 ng/mL — ABNORMAL LOW (ref 30.0–100.0)

## 2018-04-25 NOTE — Telephone Encounter (Signed)
Spoke with patient regarding benefit for ultrasound. Patient understood and agreeable. Patient ready to schedule. Patient scheduled 04/27/2018 with Dr Sabra Heck. Patient aware of appointment date, arrival time and cancellation policy. No further questions. Will close encounter

## 2018-04-27 ENCOUNTER — Encounter: Payer: Self-pay | Admitting: Obstetrics and Gynecology

## 2018-04-27 ENCOUNTER — Ambulatory Visit (INDEPENDENT_AMBULATORY_CARE_PROVIDER_SITE_OTHER): Payer: BC Managed Care – PPO

## 2018-04-27 ENCOUNTER — Ambulatory Visit: Payer: BC Managed Care – PPO | Admitting: Obstetrics & Gynecology

## 2018-04-27 ENCOUNTER — Ambulatory Visit (INDEPENDENT_AMBULATORY_CARE_PROVIDER_SITE_OTHER): Payer: BC Managed Care – PPO | Admitting: Obstetrics and Gynecology

## 2018-04-27 VITALS — BP 112/72 | HR 72 | Resp 14 | Ht 66.0 in

## 2018-04-27 VITALS — BP 112/72 | HR 72 | Ht 66.0 in

## 2018-04-27 DIAGNOSIS — N3946 Mixed incontinence: Secondary | ICD-10-CM | POA: Diagnosis not present

## 2018-04-27 DIAGNOSIS — T8332XA Displacement of intrauterine contraceptive device, initial encounter: Secondary | ICD-10-CM | POA: Diagnosis not present

## 2018-04-27 DIAGNOSIS — N816 Rectocele: Secondary | ICD-10-CM | POA: Diagnosis not present

## 2018-04-27 DIAGNOSIS — N8110 Cystocele, unspecified: Secondary | ICD-10-CM | POA: Diagnosis not present

## 2018-04-27 DIAGNOSIS — Z6841 Body Mass Index (BMI) 40.0 and over, adult: Secondary | ICD-10-CM | POA: Diagnosis not present

## 2018-04-27 DIAGNOSIS — N83202 Unspecified ovarian cyst, left side: Secondary | ICD-10-CM

## 2018-04-27 DIAGNOSIS — E786 Lipoprotein deficiency: Secondary | ICD-10-CM

## 2018-04-27 DIAGNOSIS — E559 Vitamin D deficiency, unspecified: Secondary | ICD-10-CM

## 2018-04-27 DIAGNOSIS — R7303 Prediabetes: Secondary | ICD-10-CM | POA: Diagnosis not present

## 2018-04-27 DIAGNOSIS — D509 Iron deficiency anemia, unspecified: Secondary | ICD-10-CM

## 2018-04-27 MED ORDER — VITAMIN D (ERGOCALCIFEROL) 1.25 MG (50000 UNIT) PO CAPS: 50000.0000 [IU] | ORAL_CAPSULE | ORAL | 0 refills | Status: DC

## 2018-04-27 NOTE — Progress Notes (Signed)
GYNECOLOGY  VISIT   HPI: 37 y.o.   Married  Caucasian  female   [phone] with No LMP recorded. (Menstrual status: IUD).   here for bladder prolapse.     Referred by Dr. Sabra Heck.   Having symptoms since her son was born 76 years ago.   In the last year, leaks with squatting, cough, laugh, and sneeze more so.  Has some urgency with standing.  Can have spontaneous urge and leak.  She is unable to do a Kegel to stop this.  Does wear a pad.  DF - every 2 hours.  NF - maybe 1 time per night.  No enuresis.   No fecal incontinence.  Occ constipation.  No splinting.   Does feel a bulge.  Goes back up with lying down.  No pain with intercourse.   Denies UTI, hematuria, dysuria, renal stones.   No prior pelvic surgery.   Denies hx of glaucoma, irregular heart beat, or HTN.   Two children 8 and 60 yo.   Had 2 vaginal deliveries, 8 #1 ounce and 8 # 5 ounces.  Did have a vacuum delivery.  Teaches physics. Coaches soccer.   GYNECOLOGIC HISTORY: No LMP recorded. (Menstrual status: IUD). Contraception:  IUD  Menopausal hormone therapy:  n/a Last mammogram:  never Last pap smear:   04/27/16 Neg;Neg HR HPV        OB History    Gravida  3   Para  2   Term  2   Preterm      AB  1   Living  2     SAB  1   TAB      Ectopic      Multiple      Live Births  2              Patient Active Problem List   Diagnosis Date Noted  . Left ovarian cyst 04/28/2018  . Vitamin D deficiency 04/28/2018  . Microcytic anemia 04/28/2018  . BMI 40.0-44.9, adult ([place]) 04/28/2018  . Low HDL (under 40) 04/28/2018  . Prediabetes 04/28/2018    Past Medical History:  Diagnosis Date  . Anxiety   . GERD (gastroesophageal reflux disease)   . Hiatal hernia   . Urinary incontinence     Past Surgical History:  Procedure Laterality Date  . BREAST REDUCTION SURGERY  2000  . LIPOMA EXCISION  2015   left shoulder lipoma removed (2)  . TEAR DUCT PROBING  04/1979    Current  Outpatient Medications  Medication Sig Dispense Refill  . escitalopram (LEXAPRO) 10 MG tablet Take 1 tablet (10 mg total) by mouth daily. 90 tablet 4  . ibuprofen (ADVIL,MOTRIN) 200 MG tablet Take 200 mg by mouth every 8 (eight) hours as needed.    [name] levonorgestrel (MIRENA) 20 MCG/24HR IUD 1 each by Intrauterine route once. Placed 05/05/15    . omeprazole (PRILOSEC) 40 MG capsule Take 1 capsule (40 mg total) by mouth daily. 90 capsule 3  . spironolactone (ALDACTONE) 100 MG tablet Take 1 tablet by mouth daily.    . Vitamin D, Ergocalciferol, (DRISDOL) 1.25 MG (50000 UT) CAPS capsule Take 1 capsule (50,000 Units total) by mouth every 7 (seven) days. 12 capsule 0   No current facility-administered medications for this visit.      ALLERGIES: Patient has no known allergies.  Family History  Problem Relation Age of Onset  . Cancer Father        prostate cancer  .  Mitral valve prolapse Father   . Breast cancer Maternal Aunt   . Ovarian cancer Maternal Grandmother   . Skin cancer Maternal Grandfather   . Lung cancer Maternal Grandfather     Social History   Socioeconomic History  . Marital status: Married    Spouse name: Not on file  . Number of children: Not on file  . Years of education: Not on file  . Highest education level: Not on file  Occupational History  . Not on file  Social Needs  . Financial resource strain: Not on file  . Food insecurity:    Worry: Not on file    Inability: Not on file  . Transportation needs:    Medical: Not on file    Non-medical: Not on file  Tobacco Use  . Smoking status: Never Smoker  . Smokeless tobacco: Never Used  Substance and Sexual Activity  . Alcohol use: No  . Drug use: No  . Sexual activity: Yes    Birth control/protection: I.U.D.    Comment: mirena placed 05/05/15  Lifestyle  . Physical activity:    Days per week: Not on file    Minutes per session: Not on file  . Stress: Not on file  Relationships  . Social connections:     Talks on phone: Not on file    Gets together: Not on file    Attends religious service: Not on file    Active member of club or organization: Not on file    Attends meetings of clubs or organizations: Not on file    Relationship status: Not on file  . Intimate partner violence:    Fear of current or ex partner: Not on file    Emotionally abused: Not on file    Physically abused: Not on file    Forced sexual activity: Not on file  Other Topics Concern  . Not on file  Social History Narrative  . Not on file    Review of Systems  Constitutional: Negative.   HENT: Negative.   Eyes: Negative.   Respiratory: Negative.   Cardiovascular: Negative.   Gastrointestinal: Negative.   Endocrine: Negative.   Genitourinary: Negative.   Musculoskeletal: Negative.   Skin: Negative.   Allergic/Immunologic: Negative.   Neurological: Negative.   Hematological: Negative.   Psychiatric/Behavioral: Negative.     PHYSICAL EXAMINATION:    BP 112/72   Pulse 72   Ht 5\' 6"  (1.676 m)   BMI 41.06 kg/m     General appearance: alert, cooperative and appears stated age  Abdomen: soft, non-tender, no masses,  no organomegaly No abnormal inguinal nodes palpated Neurologic: Grossly normal  Pelvic: External genitalia:  no lesions              Urethra:  normal appearing urethra with no masses, tenderness or lesions              Bartholins and Skenes: normal                 Vagina: normal appearing vagina with normal color and discharge, no lesions.  Second degree cystocele, almost second degree rectocele.   Good uterine support.               Cervix: no lesions                Bimanual Exam:  Uterus:  normal size, contour, position, consistency, mobility, non-tender              Adnexa:  no mass, fullness, tenderness              Rectal exam: Yes.  .  Confirms.              Anus:  normal sphincter tone, no lesions  Chaperone was present for exam.  ASSESSMENT  Cystocele.  Stress incontinence.   Urge incontinence.  Rectocele.  Not symptomatic per patient.   PLAN  We discussed her incontinence and prolapse including etiologies and options for care including physical therapy, anticholinergic/antimuscarininc therapy, reduction of bladder irritant use, pessary use, weight loss, and surgical repair with midurethal sling/cystoscopy and anterior colporrhaphy. I reviewed the specific risks and benefits of surgical care including cystotomy, mesh exposures and erosions requiring further surgical care, urinary retention, increased urinary urgency, UTI, and slower voiding.   Risks of rectocele repair including accidental proctotomy and painful intercourse also reviewed.  ACOG materials given to patient on prolapse and incontinence in general and also surgical care for prolapse and incontinence.  She requests to proceed forward with urodynamic testing.  Procedure explained.  Reduction of the prolapse with a pessary not needed. Questions invited and answered.    An After Visit Summary was printed and given to the patient.  ___25___ minutes face to face time of which over 50% was spent in counseling.

## 2018-04-27 NOTE — Progress Notes (Signed)
Spoke with patient while in office.3 month f/u PUS scheduled for 07/20/18 at 1pm, consult to follow at 1:30pm with Dr. Sabra Heck. Patient verbalizes understanding and is agreeable.

## 2018-04-27 NOTE — Progress Notes (Signed)
38 y.o. [id number] Married White or Caucasian female here for pelvic ultrasound due to inability to see IUD string on initial exam 04/24/2018.  She typically does not cycle with her IUD.  She has not had any recent pain or abnormal discharge.  We also reviewed lab work today.  HbA1C is mildly elevated showing prediabetes.  HDLs are low.  She has mild anemia with hb 10.5.  Pt does report hx of anemia with pregnancies and did take iron in the past which caused severe constipation to the point of impaction.  Lastly Vit D is low and she will need to start 50K weekly for 12 weeks with repeat level drawn in 3 months for follow up.  questions all answered.  No LMP recorded. (Menstrual status: IUD).  Contraception: Mirena IUD  Findings:  UTERUS: 10.6 x 5.8 x 5.4cm with IUD in correct location and IUD string about 83mm from external os EMS: 7.14mm ADNEXA: Left ovary: 3.9 x 4.3 x 2.3cm with thin walled cyst meausing 2.5 x 1.8 x 3.8cm.  This is elongated, avascular and echofree       Right ovary: 2.2 x 1.6 x 1.5cm CUL DE SAC: no free fluid  Discussion:  Findings reviewed and lab work discussed.  Pt admits she has poor eating habits (drinks sugared drinks every day).  Would like help with this.  Community resources discussed.  She would like to see provider for weight loss/nutrition counseling.    Assessment:  3.8cm left ovarian cyst Correct placement of IUD with string 46mm from external os BMI 41 Low HDLs Prediabetes  Plan:  Repeat PUS 3 months Staring Vit D 50K weekly.  Recheck level 3 months Iron levels obtained today.   Referral to Dr. Migdalia Dk program Pt does need to plan having PCP as well  ~15 minutes spent with patient >50% of time was in face to face discussion of above.

## 2018-04-28 DIAGNOSIS — D509 Iron deficiency anemia, unspecified: Secondary | ICD-10-CM | POA: Insufficient documentation

## 2018-04-28 DIAGNOSIS — Z6841 Body Mass Index (BMI) 40.0 and over, adult: Secondary | ICD-10-CM | POA: Insufficient documentation

## 2018-04-28 DIAGNOSIS — N83202 Unspecified ovarian cyst, left side: Secondary | ICD-10-CM | POA: Insufficient documentation

## 2018-04-28 DIAGNOSIS — E786 Lipoprotein deficiency: Secondary | ICD-10-CM | POA: Insufficient documentation

## 2018-04-28 DIAGNOSIS — E559 Vitamin D deficiency, unspecified: Secondary | ICD-10-CM | POA: Insufficient documentation

## 2018-04-28 DIAGNOSIS — R7303 Prediabetes: Secondary | ICD-10-CM | POA: Insufficient documentation

## 2018-04-28 LAB — IRON,TIBC AND FERRITIN PANEL
Ferritin: 8 ng/mL — ABNORMAL LOW (ref 15–150)
Iron Saturation: 12 % — ABNORMAL LOW (ref 15–55)
Iron: 41 ug/dL (ref 27–159)
TIBC: 350 ug/dL (ref 250–450)
UIBC: 309 ug/dL (ref 131–425)

## 2018-04-29 ENCOUNTER — Encounter: Payer: Self-pay | Admitting: Obstetrics and Gynecology

## 2018-05-02 ENCOUNTER — Telehealth: Payer: Self-pay | Admitting: Obstetrics and Gynecology

## 2018-05-02 NOTE — Telephone Encounter (Signed)
Call placed to patient to review benefits and to schedule urodynamics testing. Left voicemail messages requesting a return call

## 2018-05-02 NOTE — Telephone Encounter (Signed)
Patient returned call. Spoke with patient regarding benefit for urodynamic testing. Patient understood and agreeable. Patient ready to schedule. Patient scheduled 05/16/2008 on the nurses schedule. Patient aware of appointment date, arrival time and cancellation policy.   Patient is also scheduled for urinalysis prior to urodynamic testing on 05/10/2018.   Routing to Lamont Snowball, RN for final review  cc: Karen Chafe, RN

## 2018-05-03 ENCOUNTER — Telehealth: Payer: Self-pay | Admitting: Obstetrics and Gynecology

## 2018-05-03 ENCOUNTER — Encounter: Payer: Self-pay | Admitting: Obstetrics & Gynecology

## 2018-05-03 NOTE — Telephone Encounter (Signed)
Patient is calling back to reschedule urodynamics testing from 3/18 to 3/11 if still available.

## 2018-05-03 NOTE — Telephone Encounter (Addendum)
Patient would like to reschedule her urodynamic testing appointment 05/17/18.  I did not cancel the apportionments.

## 2018-05-03 NOTE — Telephone Encounter (Signed)
Routing to Dr. Sabra Heck to advise on 04/27/18 iron studies

## 2018-05-03 NOTE — Telephone Encounter (Signed)
Patient sent the following correspondence through [place]. Routing to triage to assist patient with request.  Good morning Dr. Sabra Heck,    I wanted to inquire about the last lab work completed. It was the iron panel. Are those results back?    I also haven't heard from Dr. Leafy Ro to whom you sent a referral.    Have a great day!    Thanks, Alexis Jones   Cc: Triage & Basilia Jumbo

## 2018-05-04 ENCOUNTER — Telehealth: Payer: Self-pay | Admitting: Obstetrics & Gynecology

## 2018-05-04 ENCOUNTER — Other Ambulatory Visit: Payer: Self-pay | Admitting: Obstetrics & Gynecology

## 2018-05-04 ENCOUNTER — Encounter: Payer: Self-pay | Admitting: Obstetrics & Gynecology

## 2018-05-04 ENCOUNTER — Encounter (INDEPENDENT_AMBULATORY_CARE_PROVIDER_SITE_OTHER): Payer: BC Managed Care – PPO

## 2018-05-04 DIAGNOSIS — Z1231 Encounter for screening mammogram for malignant neoplasm of breast: Secondary | ICD-10-CM

## 2018-05-04 NOTE — Telephone Encounter (Signed)
Call to patient 05/04/18.  Please see additional telephone encounter dated 05/02/2018

## 2018-05-04 NOTE — Telephone Encounter (Signed)
Call to patient to rescheduled urodynamics appointment to 05/10/18. Will she need to come in tomorrow for lab work?  [employer]

## 2018-05-04 NOTE — Telephone Encounter (Signed)
Spoke with patient.  Spoke with patient and urodynamics instructions given. Scheduled urodynamics procedure for 05/10/2018 Advised to stop all bladder medications one week prior to procedure, patient states she is not on any medications. Arrive with comfortably full bladder.  Advised will need pre procedure UA to check for any infection prior. Scheduled for  05/05/2018   Brief description of procedure given.  Patient verbalizes understanding of instructions and agreeable to appointments as scheduled.

## 2018-05-05 ENCOUNTER — Ambulatory Visit (INDEPENDENT_AMBULATORY_CARE_PROVIDER_SITE_OTHER): Payer: BC Managed Care – PPO

## 2018-05-05 ENCOUNTER — Encounter: Payer: Self-pay | Admitting: Obstetrics & Gynecology

## 2018-05-05 ENCOUNTER — Other Ambulatory Visit: Payer: Self-pay | Admitting: Obstetrics & Gynecology

## 2018-05-05 VITALS — BP 110/60 | HR 68 | Resp 14 | Ht 66.0 in | Wt 253.0 lb

## 2018-05-05 DIAGNOSIS — Z01812 Encounter for preprocedural laboratory examination: Secondary | ICD-10-CM

## 2018-05-05 DIAGNOSIS — D508 Other iron deficiency anemias: Secondary | ICD-10-CM

## 2018-05-05 LAB — POCT URINALYSIS DIPSTICK
Bilirubin, UA: NEGATIVE
Blood, UA: NEGATIVE
Glucose, UA: NEGATIVE
Ketones, UA: NEGATIVE
Leukocytes, UA: NEGATIVE
Nitrite, UA: NEGATIVE
Protein, UA: NEGATIVE
Urobilinogen, UA: NEGATIVE E.U./dL — AB
pH, UA: 5.5 (ref 5.0–8.0)

## 2018-05-05 NOTE — Progress Notes (Signed)
Patient is here for pre procedure urinalysis. Patient denies any symptoms.   Routing to provider for final review.

## 2018-05-05 NOTE — Telephone Encounter (Signed)
Message sent to patient about vitamins she should take.  She needs repeat CBC and iron studies in 3 months.  Orders placed but she needs to have appt scheduled.  Will you please call her?  Thanks.

## 2018-05-05 NOTE — Telephone Encounter (Signed)
Patient sent the following correspondence through [place]. Routing to triage to Dr. Sabra Heck for review.  Hi Dr. Sabra Heck,  I'll try vitamins to treat my iron deficiency. Do have a brand recommendation?    Thanks, Alexis Jones

## 2018-05-05 NOTE — Progress Notes (Signed)
Lab orders placed.  

## 2018-05-08 NOTE — Telephone Encounter (Signed)
Lab appt scheduled for 08/11/18

## 2018-05-09 NOTE — Patient Instructions (Signed)
After your procedure:   You may have a mild bladder and rectal discomfort for a few hours after the test. . You may experience some frequent urination and slight burning the first few times you urinate after the test. Rarely, the urine may be blood tinged. These are both due to catheter placements and resolve quickly.  . You should call our office immediately if you have signs of infection, which may include bladder pain, urinary urgency, fever, or burning during urination. .  We do encourage you to drink plenty of water after completion of the test today.   Please call our office with any concerns or questions.   [place] [address], [place], [place] [address] [facility] ([facility]):  [phone]; Fax:  [phone]

## 2018-05-10 ENCOUNTER — Other Ambulatory Visit: Payer: Self-pay

## 2018-05-10 ENCOUNTER — Other Ambulatory Visit: Payer: BC Managed Care – PPO

## 2018-05-10 ENCOUNTER — Ambulatory Visit (INDEPENDENT_AMBULATORY_CARE_PROVIDER_SITE_OTHER): Payer: BC Managed Care – PPO | Admitting: Obstetrics and Gynecology

## 2018-05-10 ENCOUNTER — Encounter: Payer: Self-pay | Admitting: Obstetrics & Gynecology

## 2018-05-10 VITALS — BP 110/60 | HR 74 | Resp 12 | Ht 66.0 in | Wt 252.0 lb

## 2018-05-10 DIAGNOSIS — N3946 Mixed incontinence: Secondary | ICD-10-CM

## 2018-05-10 DIAGNOSIS — N8110 Cystocele, unspecified: Secondary | ICD-10-CM

## 2018-05-10 DIAGNOSIS — N816 Rectocele: Secondary | ICD-10-CM

## 2018-05-10 NOTE — Progress Notes (Signed)
Alexis Jones is a 38 y.o. female Who presents today for urodynamics testing, ordered by Dr. Quincy Simmonds    Allergies and medications reviewed.  Denies complaints today. No urinary complaints.   Urine Micro exam: negative for WBC's or RBC's, okay to proceed per Dr. Quincy Simmonds.    Patient reports urinary leakage with coughing, sneezing, exercise, squatting.    Urodynamics testing initiated. Lumax Bladder Catheter #10 [place] and lumax Abdominal Catheter #10 [place].   Post void residual 10 ml.   Urethral catheter placed without issue. Rectal catheter placed without issue.   Urodynamics testing completed. Please see scanned Patient summary report in Epic. Procedure completed and patient tolerated well without complaints. Patient scheduled for follow up office visit with Dr. Quincy Simmonds  to discuss results. Patient agreeable.   Patient given post procedure instructions:  You may have a mild bladder and rectal discomfort for a few hours after the test. You may experience some frequent urination and slight burning the first few times you urinate after the test. Rarely, the urine may be blood tinged. These are both due to catheter placements and resolve quickly. You should call our office immediately if you have signs of infection, which may include bladder pain, urinary urgency, fever, or burning during urination. We do encourage you to drink plenty of water after the test.

## 2018-05-11 ENCOUNTER — Encounter: Payer: Self-pay | Admitting: Obstetrics and Gynecology

## 2018-05-15 ENCOUNTER — Other Ambulatory Visit: Payer: Self-pay

## 2018-05-15 ENCOUNTER — Ambulatory Visit: Payer: BC Managed Care – PPO | Admitting: Obstetrics and Gynecology

## 2018-05-15 ENCOUNTER — Encounter: Payer: Self-pay | Admitting: Obstetrics and Gynecology

## 2018-05-15 VITALS — BP 132/70 | HR 84 | Resp 16 | Ht 66.0 in | Wt 253.0 lb

## 2018-05-15 DIAGNOSIS — N816 Rectocele: Secondary | ICD-10-CM

## 2018-05-15 DIAGNOSIS — N3946 Mixed incontinence: Secondary | ICD-10-CM | POA: Diagnosis not present

## 2018-05-15 DIAGNOSIS — N8110 Cystocele, unspecified: Secondary | ICD-10-CM

## 2018-05-15 NOTE — Progress Notes (Signed)
GYNECOLOGY  VISIT   HPI: 38 y.o.   Married  Caucasian  female   [phone] with No LMP recorded. (Menstrual status: IUD).   here for consult after urodynamics.  Multichannel urodynamic testing done 05/10/18. Uroflow - void 180 cc.  PVR 10 cc. Continuous flow.  CMG - S1 242 cc, S2 458 cc.  Max capacity 458 cc.              VLPP 148 cc H2O.  Stable CMG.  UPP - 59 cm H2O.   Pressure flow -  30 cm H2O, estimate.  Continuous void.  No Valsalva voiding.   States she thinks she has a short interval between needs to void and needing to run to void.   Notes difficulty to pass the rectal catheter for the urodynamic testing. She states she notes she lays back on the toilet. to have a BM.  In the last year, leaks with squatting, cough, laugh, and sneeze more so.  Has some urgency with standing.  Can have spontaneous urge and leak.  She is unable to do a Kegel to stop this.  Does wear a pad.  DF - every 2 hours.  NF - maybe 1 time per night.  No enuresis.   No fecal incontinence.  Occ constipation.  No splinting.   Does feel a bulge.  Goes back up with lying down.  No pain with intercourse.   On physical exam and a second degree cystocele, almost second degree rectocele and good uterine support.   States no menstruation at all with her current IUD.   She is doing a weight loss program with Dr. Leafy Ro.   GYNECOLOGIC HISTORY: No LMP recorded. (Menstrual status: IUD). Contraception:  IUD Menopausal hormone therapy:  n/a Last mammogram:  never Last pap smear:   04/27/16 Neg:Neg HR HPV        OB History    Gravida  3   Para  2   Term  2   Preterm      AB  1   Living  2     SAB  1   TAB      Ectopic      Multiple      Live Births  2              Patient Active Problem List   Diagnosis Date Noted  . Left ovarian cyst 04/28/2018  . Vitamin D deficiency 04/28/2018  . Microcytic anemia 04/28/2018  . BMI 40.0-44.9, adult ([place]) 04/28/2018  . Low HDL (under 40)  04/28/2018  . Prediabetes 04/28/2018    Past Medical History:  Diagnosis Date  . Anxiety   . GERD (gastroesophageal reflux disease)   . Hiatal hernia   . Urinary incontinence     Past Surgical History:  Procedure Laterality Date  . BREAST REDUCTION SURGERY  2000  . LIPOMA EXCISION  2015   left shoulder lipoma removed (2)  . TEAR DUCT PROBING  04/1979    Current Outpatient Medications  Medication Sig Dispense Refill  . escitalopram (LEXAPRO) 10 MG tablet Take 1 tablet (10 mg total) by mouth daily. 90 tablet 4  . ibuprofen (ADVIL,MOTRIN) 200 MG tablet Take 200 mg by mouth every 8 (eight) hours as needed.    [name] levonorgestrel (MIRENA) 20 MCG/24HR IUD 1 each by Intrauterine route once. Placed 05/05/15    . Multiple Vitamin (MULTIVITAMIN IRON-FREE PO) Take by mouth.    [name] omeprazole (PRILOSEC) 40 MG capsule Take 1 capsule (  40 mg total) by mouth daily. 90 capsule 3  . spironolactone (ALDACTONE) 100 MG tablet Take 1 tablet by mouth daily.    . Vitamin D, Ergocalciferol, (DRISDOL) 1.25 MG (50000 UT) CAPS capsule Take 1 capsule (50,000 Units total) by mouth every 7 (seven) days. 12 capsule 0   No current facility-administered medications for this visit.      ALLERGIES: Patient has no known allergies.  Family History  Problem Relation Age of Onset  . Cancer Father        prostate cancer  . Mitral valve prolapse Father   . Breast cancer Maternal Aunt   . Ovarian cancer Maternal Grandmother   . Skin cancer Maternal Grandfather   . Lung cancer Maternal Grandfather   Great maternal aunt and great grandmother with breast cancer.  Social History   Socioeconomic History  . Marital status: Married    Spouse name: Not on file  . Number of children: Not on file  . Years of education: Not on file  . Highest education level: Not on file  Occupational History  . Not on file  Social Needs  . Financial resource strain: Not on file  . Food insecurity:    Worry: Not on file    Inability:  Not on file  . Transportation needs:    Medical: Not on file    Non-medical: Not on file  Tobacco Use  . Smoking status: Never Smoker  . Smokeless tobacco: Never Used  Substance and Sexual Activity  . Alcohol use: No  . Drug use: No  . Sexual activity: Yes    Birth control/protection: I.U.D.    Comment: mirena placed 05/05/15  Lifestyle  . Physical activity:    Days per week: Not on file    Minutes per session: Not on file  . Stress: Not on file  Relationships  . Social connections:    Talks on phone: Not on file    Gets together: Not on file    Attends religious service: Not on file    Active member of club or organization: Not on file    Attends meetings of clubs or organizations: Not on file    Relationship status: Not on file  . Intimate partner violence:    Fear of current or ex partner: Not on file    Emotionally abused: Not on file    Physically abused: Not on file    Forced sexual activity: Not on file  Other Topics Concern  . Not on file  Social History Narrative  . Not on file    Review of Systems  Constitutional: Negative.   HENT: Negative.   Eyes: Negative.   Respiratory: Negative.   Cardiovascular: Negative.   Gastrointestinal: Negative.   Endocrine: Negative.   Genitourinary: Negative.   Musculoskeletal: Negative.   Skin: Negative.   Allergic/Immunologic: Negative.   Neurological: Negative.   Hematological: Negative.   Psychiatric/Behavioral: Negative.     PHYSICAL EXAMINATION:    BP 132/70 (BP Location: Right Arm, Patient Position: Sitting, Cuff Size: Large)   Pulse 84   Resp 16   Ht 5\' 6"  (1.676 m)   Wt 253 lb (114.8 kg)   BMI 40.84 kg/m     General appearance: alert, cooperative and appears stated age   ASSESSMENT  Mixed incontinence.  Cystocele and rectocele.  Good uterine support.  BMI 40.84.   PLAN  We reviewed her urodynamic testing and confirmation of dx of stress incontinence which can be treated with observation,  pelvic  floor PT, Impressa or pessary use, and midurethral sling combined with anterior and posterior colporrhaphy.  I did mention that hysterectomy can be performed in conjunction to a pelvic floor repair if indicated.  We discussed surgical care and expectations for procedure and recovery.  We reviewed weight loss as an adjunct to the above and a way to reduce complications related to surgery.  She will be meeting one on one with Dr. Leafy Ro soon to create weight loss plan. Patient will consider her options for pelvic floor care and contact the office back about her choice.    An After Visit Summary was printed and given to the patient.  _40_____ minutes face to face time of which over 50% was spent in counseling.

## 2018-05-17 ENCOUNTER — Encounter (INDEPENDENT_AMBULATORY_CARE_PROVIDER_SITE_OTHER): Payer: Self-pay | Admitting: Family Medicine

## 2018-05-17 ENCOUNTER — Other Ambulatory Visit: Payer: Self-pay

## 2018-05-17 ENCOUNTER — Ambulatory Visit (INDEPENDENT_AMBULATORY_CARE_PROVIDER_SITE_OTHER): Payer: BC Managed Care – PPO | Admitting: Family Medicine

## 2018-05-17 ENCOUNTER — Ambulatory Visit: Payer: BC Managed Care – PPO

## 2018-05-17 VITALS — BP 118/82 | HR 67 | Ht 66.0 in | Wt 249.0 lb

## 2018-05-17 DIAGNOSIS — R5383 Other fatigue: Secondary | ICD-10-CM

## 2018-05-17 DIAGNOSIS — Z9189 Other specified personal risk factors, not elsewhere classified: Secondary | ICD-10-CM | POA: Diagnosis not present

## 2018-05-17 DIAGNOSIS — E559 Vitamin D deficiency, unspecified: Secondary | ICD-10-CM

## 2018-05-17 DIAGNOSIS — R739 Hyperglycemia, unspecified: Secondary | ICD-10-CM | POA: Diagnosis not present

## 2018-05-17 DIAGNOSIS — Z6841 Body Mass Index (BMI) 40.0 and over, adult: Secondary | ICD-10-CM

## 2018-05-17 DIAGNOSIS — R0602 Shortness of breath: Secondary | ICD-10-CM | POA: Diagnosis not present

## 2018-05-17 DIAGNOSIS — Z1331 Encounter for screening for depression: Secondary | ICD-10-CM | POA: Diagnosis not present

## 2018-05-17 DIAGNOSIS — Z0289 Encounter for other administrative examinations: Secondary | ICD-10-CM

## 2018-05-17 NOTE — Progress Notes (Signed)
Office: [phone]  /  Fax: [phone]   Dear Dr. Megan Jones,   Thank you for referring Alexis Jones to our clinic. The following note includes my evaluation and treatment recommendations.  HPI:   Chief Complaint: OBESITY    Alexis Jones has been referred by Alexis Salon, MD for consultation regarding her obesity and obesity related comorbidities.    Alexis Jones (MR# [id number]) is a 38 y.o. female who presents on 05/17/2018 for obesity evaluation and treatment. Current BMI is Body mass index is 40.19 kg/m.  Alexis Jones has been struggling with her weight for many years and has been unsuccessful in either losing weight, maintaining weight loss, or reaching her healthy weight goal.     Alexis Jones attended our information session and states she is currently in the action stage of change and ready to dedicate time achieving and maintaining a healthier weight. Alexis Jones is interested in becoming our patient and working on intensive lifestyle modifications including (but not limited to) diet, exercise and weight loss.    Alexis Jones states her family eats meals together she could possible struggle with family and or coworkers weight loss sabotage her desired weight loss is 99 lbs she started gaining weight after grad school her heaviest weight ever was 254 lbs she has significant food cravings issues  she snacks frequently in the evenings she skips meals frequently she is frequently drinking liquids with calories she frequently makes poor food choices she sometimes eats larger portions than normal  she has binge eating behaviors she struggles with emotional eating    Fatigue Alexis Jones feels her energy is lower than it should be. This has worsened with weight gain and has not worsened recently. Alexis Jones admits to daytime somnolence and admits to waking up still tired. Patient is at risk for obstructive sleep apnea. Patient generally gets 4 or 6 hours of  sleep per night, and states they generally have generally restful sleep. Snoring is present. Apneic episodes are not present. Epworth Sleepiness Score is 8  Dyspnea on exertion Alexis Jones notes increasing shortness of breath with exercising and seems to be worsening over time with weight gain. She notes getting out of breath sooner with activity than she used to. This has not gotten worse recently.  Vitamin D Deficiency Alexis Jones has a diagnosis of vitamin D deficiency. She is currently on prescription vit D for the last month and she still notes fatigue.  Hyperglycemia Alexis Jones has a history of some elevated blood glucose readings without a diagnosis of diabetes. Alexis Jones is not on metformin and her last A1c was elevated at 5.9 on 04/24/18. She admits to polyphagia.  At risk for diabetes Alexis Jones is at higher than average risk for developing diabetes due to her hyperglycemia and obesity. She currently denies polyuria or polydipsia.  Depression Screen Alexis Jones (modified PHQ-9) score was 7. Depression screen Alexis Jones 2/9 05/17/2018  Decreased Interest 2  Down, Depressed, Hopeless 1  PHQ - 2 Score 3  Altered sleeping 1  Tired, decreased energy 2  Change in appetite 1  Feeling bad or failure about yourself  0  Trouble concentrating 0  Moving slowly or fidgety/restless 0  Suicidal thoughts 0  PHQ-9 Score 7  Difficult doing work/chores Not difficult at all   ASSESSMENT AND PLAN:  Other fatigue - Plan: EKG 12-Lead, Vitamin B12, CBC With Differential, Folate, T3, T4, free, TSH  Shortness of breath on exertion  Vitamin D deficiency - Plan: VITAMIN D 25 Hydroxy (Vit-D Deficiency, Fractures)  Hyperglycemia -  Plan: Comprehensive metabolic panel, Hemoglobin A1c, Insulin, random  Depression screening  At risk for diabetes mellitus  Class 3 severe obesity with serious comorbidity and body mass index (BMI) of 40.0 to 44.9 in adult, unspecified obesity type  (Alexis Jones)  PLAN:  Fatigue Alexis Jones was informed that her fatigue may be related to obesity, depression or many other causes. Labs will be ordered, and in the meanwhile Alexis Jones has agreed to work on diet, exercise and weight loss to help with fatigue. Proper sleep hygiene was discussed including the need for 7-8 hours of quality sleep each night. A sleep study was not ordered based on symptoms and Epworth score. An EKG and an indirect calorimetry was ordered today. Alexis Jones agrees to follow up in 2 weeks.  Dyspnea on exertion Alexis Jones's shortness of breath appears to be obesity related and exercise induced. She has agreed to work on weight loss and gradually increase exercise to treat her exercise induced shortness of breath. If Alexis Jones follows our instructions and loses weight without improvement of her shortness of breath, we will plan to refer to pulmonology. We will monitor this condition regularly. Alexis Jones agrees to this plan.  Vitamin D Deficiency Alexis Jones was informed that low vitamin D levels contribute to fatigue and are associated with obesity, breast, and colon cancer. [name] agrees to continue to take prescription Vit D @50 ,000 IU and will follow up for routine testing of vitamin D, at least 2-3 times per year. She was informed of the risk of over-replacement of vitamin D and agrees to not increase her dose unless she discusses this with [name] first. We will order a Vitamin D level today and Alexis Jones agrees to follow up in 2 weeks as directed.  Hyperglycemia Fasting labs will be obtained and results with be discussed with Alexis Jones in 2 weeks at her follow up visit. In the meanwhile, Alexis Jones was started on a lower simple carbohydrate diet prescription and will work on weight loss efforts. She agrees to follow up as directed.  Diabetes risk counseling Alexis Jones was given extended (15 minutes) diabetes prevention counseling today. She is 38 y.o. female and has risk factors for  diabetes including hyperglycemia and obesity. We discussed intensive lifestyle modifications today with an emphasis on weight loss as well as increasing exercise and decreasing simple carbohydrates in her diet.  Depression Screen Alexis Jones had a mildly positive depression screening. Depression is commonly associated with obesity and often results in emotional eating behaviors. We will monitor this closely and work on CBT to help improve the non-hunger eating patterns. Referral to Psychology may be required if no improvement is seen as she continues in our clinic.  Obesity Alexis Jones is currently in the action stage of change and her goal is to continue with weight loss efforts. I recommend Alexis Jones begin the structured treatment plan as follows:  She has agreed to follow the Category 3 plan.  Alexis Jones has been instructed to eventually work up to a goal of 150 minutes of combined cardio and strengthening exercise per week for weight loss and overall health benefits. We discussed the following Behavioral Modification Strategies today: increasing lean protein intake, no skipping meals, and work on meal planning and easy cooking plans.   She was informed of the importance of frequent follow up visits to maximize her success with intensive lifestyle modifications for her multiple health conditions. She was informed we would discuss her lab results at her next visit unless there is a critical issue that needs to be addressed sooner. Alexis Jones agreed  to keep her next visit at the agreed upon time to discuss these results.  ALLERGIES: No Known Allergies  MEDICATIONS: Current Outpatient Medications on File Prior to Visit  Medication Sig Dispense Refill   escitalopram (LEXAPRO) 10 MG tablet Take 1 tablet (10 mg total) by mouth daily. 90 tablet 4   ibuprofen (ADVIL,MOTRIN) 200 MG tablet Take 200 mg by mouth every 8 (eight) hours as needed.     levonorgestrel (MIRENA) 20 MCG/24HR IUD 1 each by  Intrauterine route once. Placed 05/05/15     Multiple Vitamins-Minerals (WOMENS MULTI VITAMIN & MINERAL PO) Take 1 tablet by mouth daily.     omeprazole (PRILOSEC) 40 MG capsule Take 1 capsule (40 mg total) by mouth daily. 90 capsule 3   spironolactone (ALDACTONE) 100 MG tablet Take 1 tablet by mouth daily.     Vitamin D, Ergocalciferol, (DRISDOL) 1.25 MG (50000 UT) CAPS capsule Take 1 capsule (50,000 Units total) by mouth every 7 (seven) days. 12 capsule 0   No current facility-administered medications on file prior to visit.     PAST MEDICAL HISTORY: Past Medical History:  Diagnosis Date   Anxiety    Asthma    Dyspnea    Female bladder prolapse    GERD (gastroesophageal reflux disease)    Hiatal hernia    High triglycerides    HLD (hyperlipidemia)    Left ovarian cyst    Low HDL (under 40)    Lower extremity edema    Microcytic anemia    PCOS (polycystic ovarian syndrome)    Prediabetes    Rectocele    Urinary incontinence    Vitamin D deficiency     PAST SURGICAL HISTORY: Past Surgical History:  Procedure Laterality Date   BREAST REDUCTION SURGERY  2000   LIPOMA EXCISION  2015   left shoulder lipoma removed (2)   TEAR DUCT PROBING  04/1979    SOCIAL HISTORY: Social History   Tobacco Use   Smoking status: Never Smoker   Smokeless tobacco: Never Used  Substance Use Topics   Alcohol use: No   Drug use: No    FAMILY HISTORY: Family History  Problem Relation Age of Onset   Hypertension Mother    Hyperlipidemia Mother    Thyroid disease Mother    Anxiety disorder Mother    Obesity Mother    Cancer Father        prostate cancer   Mitral valve prolapse Father    Obesity Father    Breast cancer Maternal Aunt    Ovarian cancer Maternal Grandmother    Skin cancer Maternal Grandfather    Lung cancer Maternal Grandfather    ROS: Review of Systems  Constitutional: Positive for malaise/fatigue. Negative for weight loss.   HENT:       Positive for stuffiness (seasonal allergies)  Eyes:       Wears glasses or contacts.  Respiratory: Shortness of breath:  with activity.   Gastrointestinal: Positive for heartburn.  Genitourinary: Positive for frequency.       Negative for polyuria.  Skin:       Positive for dryness.  Neurological: Positive for headaches.  Endo/Heme/Allergies: Negative for polydipsia.       Positive for polyphagia.   PHYSICAL EXAM: Blood pressure 118/82, pulse 67, height 5\' 6"  (1.676 m), weight 249 lb (112.9 kg), SpO2 98 %. Body mass index is 40.19 kg/m. Physical Exam Vitals signs reviewed.  Constitutional:      Appearance: Normal appearance. She is obese.  HENT:     Head: Normocephalic and atraumatic.     Nose: Nose normal.  Eyes:     General: No scleral icterus.    Extraocular Movements: Extraocular movements intact.  Neck:     Musculoskeletal: Normal range of motion and neck supple.     Thyroid: No thyromegaly.     Comments: Negative for thyromegaly. Cardiovascular:     Rate and Rhythm: Normal rate and regular rhythm.  Pulmonary:     Effort: Pulmonary effort is normal. No respiratory distress.  Abdominal:     Palpations: Abdomen is soft.     Tenderness: There is no abdominal tenderness.     Comments: Positive for obesity.  Musculoskeletal:     Comments: ROM normal in all extremities.  Skin:    General: Skin is warm and dry.  Neurological:     Mental Status: She is alert and oriented to person, place, and time.     Coordination: Coordination normal.  Psychiatric:        Jones and Affect: Jones normal.        Behavior: Behavior normal.    RECENT LABS AND TESTS: BMET    Component Value Date/Time   NA 139 04/24/2018 1020   K 3.9 04/24/2018 1020   CL 104 04/24/2018 1020   CO2 23 04/24/2018 1020   GLUCOSE 68 04/24/2018 1020   GLUCOSE 94 11/13/2009 0401   BUN 8 04/24/2018 1020   CREATININE 0.78 04/24/2018 1020   CALCIUM 8.7 04/24/2018 1020   GFRNONAA 96  04/24/2018 1020   GFRAA 111 04/24/2018 1020   Lab Results  Component Value Date   HGBA1C 5.9 (H) 04/24/2018   No results found for: INSULIN CBC    Component Value Date/Time   WBC 6.6 04/24/2018 1020   WBC 10.2 11/13/2009 0401   RBC 4.46 04/24/2018 1020   RBC 4.47 11/13/2009 0401   HGB 10.5 (L) 04/24/2018 1020   HCT 34.0 04/24/2018 1020   PLT 307 04/24/2018 1020   MCV 76 (L) 04/24/2018 1020   MCH 23.5 (L) 04/24/2018 1020   MCH 30.0 11/13/2009 0401   MCHC 30.9 (L) 04/24/2018 1020   MCHC 33.8 11/13/2009 0401   RDW 15.1 04/24/2018 1020   LYMPHSABS 1.1 11/13/2009 0401   MONOABS 1.1 (H) 11/13/2009 0401   EOSABS 0.2 11/13/2009 0401   BASOSABS 0.1 11/13/2009 0401   Iron/TIBC/Ferritin/ %Sat    Component Value Date/Time   IRON 41 04/27/2018 1104   TIBC 350 04/27/2018 1104   FERRITIN 8 (L) 04/27/2018 1104   IRONPCTSAT 12 (L) 04/27/2018 1104   Lipid Panel     Component Value Date/Time   CHOL 176 04/24/2018 1020   TRIG 234 (H) 04/24/2018 1020   HDL 25 (L) 04/24/2018 1020   CHOLHDL 7.0 (H) 04/24/2018 1020   LDLCALC 104 (H) 04/24/2018 1020   Hepatic Function Panel     Component Value Date/Time   PROT 6.7 04/24/2018 1020   ALBUMIN 4.1 04/24/2018 1020   AST 15 04/24/2018 1020   ALT 16 04/24/2018 1020   ALKPHOS 67 04/24/2018 1020   BILITOT <0.2 04/24/2018 1020      Component Value Date/Time   TSH 2.670 04/24/2018 1020   Results for HAJAR, PENNINGER (MRN [id number]) as of 05/17/2018 10:46  Ref. Range 04/24/2018 10:20  Vitamin D, 25-Hydroxy Latest Ref Range: 30.0 - 100.0 ng/mL 19.4 (L)    ECG  shows NSR with a rate of 74 BPM. INDIRECT CALORIMETER done today shows a VO2 of  263 and a REE of 1829.  Her calculated basal metabolic rate is 8099 thus her basal metabolic rate is worse than expected.  OBESITY BEHAVIORAL INTERVENTION VISIT  Today's visit was # 1   Starting weight: 249 lbs Starting date: 05/17/2018 Today's weight : Weight: 249 lb (112.9 kg)  Today's  date: 05/17/2018 Total lbs lost to date: 0    05/17/2018  Height 5\' 6"  (1.676 m)  Weight 249 lb (112.9 kg)  BMI (Calculated) 40.21  BLOOD PRESSURE - SYSTOLIC 833  BLOOD PRESSURE - DIASTOLIC 82  Waist Measurement  48 inches   Body Fat % 47.4 %  Total Body Water (lbs) 95.4 lbs  RMR 1829   ASK: We discussed the diagnosis of obesity with Alexis Jones today and Alexis Jones agreed to give [name] permission to discuss obesity behavioral modification therapy today.  ASSESS: Ruthy has the diagnosis of obesity and her BMI today is 40.21. Yaileen is in the action stage of change.   ADVISE: [place] was educated on the multiple health risks of obesity as well as the benefit of weight loss to improve her health. She was advised of the need for long term treatment and the importance of lifestyle modifications to improve her current health and to decrease her risk of future health problems.  AGREE: Multiple dietary modification options and treatment options were discussed and Reannah agreed to follow the recommendations documented in the above note.  ARRANGE: Synia was educated on the importance of frequent visits to treat obesity as outlined per CMS and USPSTF guidelines and agreed to schedule her next follow up appointment today.  IMarcille Blanco, CMA, am acting as transcriptionist for Starlyn Skeans, MD  I have reviewed the above documentation for accuracy and completeness, and I agree with the above. -Dennard Nip, MD

## 2018-05-18 DIAGNOSIS — N8110 Cystocele, unspecified: Secondary | ICD-10-CM | POA: Insufficient documentation

## 2018-05-18 DIAGNOSIS — N3946 Mixed incontinence: Secondary | ICD-10-CM | POA: Insufficient documentation

## 2018-05-18 DIAGNOSIS — N816 Rectocele: Secondary | ICD-10-CM | POA: Insufficient documentation

## 2018-05-18 LAB — CBC WITH DIFFERENTIAL
Basophils Absolute: 0 10*3/uL (ref 0.0–0.2)
Basos: 1 %
EOS (ABSOLUTE): 0.1 10*3/uL (ref 0.0–0.4)
EOS: 2 %
Hematocrit: 35.3 % (ref 34.0–46.6)
Hemoglobin: 11 g/dL — ABNORMAL LOW (ref 11.1–15.9)
Immature Grans (Abs): 0 10*3/uL (ref 0.0–0.1)
Immature Granulocytes: 0 %
Lymphocytes Absolute: 1.7 10*3/uL (ref 0.7–3.1)
Lymphs: 25 %
MCH: 24 pg — ABNORMAL LOW (ref 26.6–33.0)
MCHC: 31.2 g/dL — ABNORMAL LOW (ref 31.5–35.7)
MCV: 77 fL — ABNORMAL LOW (ref 79–97)
Monocytes Absolute: 0.5 10*3/uL (ref 0.1–0.9)
Monocytes: 8 %
Neutrophils Absolute: 4.3 10*3/uL (ref 1.4–7.0)
Neutrophils: 64 %
RBC: 4.58 x10E6/uL (ref 3.77–5.28)
RDW: 16.2 % — ABNORMAL HIGH (ref 11.7–15.4)
WBC: 6.6 10*3/uL (ref 3.4–10.8)

## 2018-05-18 LAB — COMPREHENSIVE METABOLIC PANEL
ALT: 13 IU/L (ref 0–32)
AST: 12 IU/L (ref 0–40)
Albumin/Globulin Ratio: 1.6 (ref 1.2–2.2)
Albumin: 4.2 g/dL (ref 3.8–4.8)
Alkaline Phosphatase: 71 IU/L (ref 39–117)
BUN/Creatinine Ratio: 8 — ABNORMAL LOW (ref 9–23)
BUN: 7 mg/dL (ref 6–24)
Bilirubin Total: 0.3 mg/dL (ref 0.0–1.2)
CO2: 24 mmol/L (ref 20–29)
CREATININE: 0.85 mg/dL (ref 0.57–1.00)
Calcium: 9.2 mg/dL (ref 8.7–10.2)
Chloride: 99 mmol/L (ref 96–106)
GFR calc Af Amer: 99 mL/min/{1.73_m2} (ref >59)
GFR calc non Af Amer: 86 mL/min/{1.73_m2} (ref >59)
GLOBULIN, TOTAL: 2.6 g/dL (ref 1.5–4.5)
Glucose: 86 mg/dL (ref 65–99)
Potassium: 4.3 mmol/L (ref 3.5–5.2)
SODIUM: 136 mmol/L (ref 134–144)
Total Protein: 6.8 g/dL (ref 6.0–8.5)

## 2018-05-18 LAB — FOLATE: Folate: 15 ng/mL (ref >3.0)

## 2018-05-18 LAB — T3: T3, Total: 118 ng/dL (ref 71–180)

## 2018-05-18 LAB — VITAMIN B12: VITAMIN B 12: 458 pg/mL (ref 232–1245)

## 2018-05-18 LAB — TSH: TSH: 2.42 u[IU]/mL (ref 0.450–4.500)

## 2018-05-18 LAB — T4, FREE: Free T4: 0.92 ng/dL (ref 0.82–1.77)

## 2018-05-18 LAB — HEMOGLOBIN A1C
Est. average glucose Bld gHb Est-mCnc: 120 mg/dL
Hgb A1c MFr Bld: 5.8 % — ABNORMAL HIGH (ref 4.8–5.6)

## 2018-05-18 LAB — INSULIN, RANDOM: INSULIN: 13 u[IU]/mL (ref 2.6–24.9)

## 2018-05-18 LAB — VITAMIN D 25 HYDROXY (VIT D DEFICIENCY, FRACTURES): Vit D, 25-Hydroxy: 33.5 ng/mL (ref 30.0–100.0)

## 2018-05-23 ENCOUNTER — Encounter (INDEPENDENT_AMBULATORY_CARE_PROVIDER_SITE_OTHER): Payer: Self-pay

## 2018-05-24 ENCOUNTER — Encounter (INDEPENDENT_AMBULATORY_CARE_PROVIDER_SITE_OTHER): Payer: Self-pay | Admitting: Family Medicine

## 2018-05-25 NOTE — Telephone Encounter (Signed)
Please advise 

## 2018-06-01 ENCOUNTER — Encounter (INDEPENDENT_AMBULATORY_CARE_PROVIDER_SITE_OTHER): Payer: Self-pay | Admitting: Family Medicine

## 2018-06-01 ENCOUNTER — Ambulatory Visit (INDEPENDENT_AMBULATORY_CARE_PROVIDER_SITE_OTHER): Payer: BC Managed Care – PPO | Admitting: Family Medicine

## 2018-06-01 ENCOUNTER — Encounter (INDEPENDENT_AMBULATORY_CARE_PROVIDER_SITE_OTHER): Payer: Self-pay

## 2018-06-01 ENCOUNTER — Ambulatory Visit: Payer: BC Managed Care – PPO

## 2018-06-01 ENCOUNTER — Other Ambulatory Visit: Payer: Self-pay

## 2018-06-01 ENCOUNTER — Encounter: Payer: Self-pay | Admitting: Obstetrics and Gynecology

## 2018-06-01 DIAGNOSIS — D508 Other iron deficiency anemias: Secondary | ICD-10-CM

## 2018-06-01 DIAGNOSIS — E559 Vitamin D deficiency, unspecified: Secondary | ICD-10-CM | POA: Diagnosis not present

## 2018-06-01 DIAGNOSIS — R7303 Prediabetes: Secondary | ICD-10-CM

## 2018-06-01 DIAGNOSIS — Z6841 Body Mass Index (BMI) 40.0 and over, adult: Secondary | ICD-10-CM

## 2018-06-05 NOTE — Progress Notes (Signed)
Office: [phone]  /  Fax: [phone] TeleHealth Visit:  Alexis Jones has verbally consented to this TeleHealth visit today. The patient is located at home, the provider is located at the [employer] [facility] office. The participants in this visit include the listed provider and patient. Alexis Jones was unable to use Skype today and the Telehealth visit was conducted via telephone.  HPI:   Chief Complaint: OBESITY Alexis Jones is here to discuss her progress with her obesity treatment plan. She is on the Category 3 plan and is following her eating plan approximately 90 % of the time. She states she is exercising 0 minutes 0 times per week. Alexis Jones did well on her Category 3 plan. Her hunger was controlled and she did well with meal planning and prepping. She found most of her groceries easily and denied any family sabotage.  We were unable to weigh the patient today for this TeleHealth visit. She feels as if she has lost weight since her last visit. She has lost 0 lbs since starting treatment with [name].  Iron Deficiency Anemia Alexis Jones has a diagnosis of anemia. Her hemoglobin has increased from 10.5 to 11 in 3 weeks. She notes mild fatigue and is on a multivitamin. Alexis Jones denies palpitations or menses on Mirena.  Vitamin D Deficiency Alexis Jones has a diagnosis of vitamin D deficiency. She is on a multivitamin and was given a prescription for Vitamin D weekly, but has forgotten to take it regularly. Alexis Jones is not yet at goal and she admits fatigue.  Pre-Diabetes Alexis Jones has a diagnosis of pre-diabetes based on her elevated Hgb A1c of 5.8 on 05/16/17 and she was informed this puts her at greater risk of developing diabetes. Her hunger is controlled on her diet prescription. She has done well with weight loss on her Category 3 plan. She is not taking metformin currently and continues to work on diet and exercise to decrease risk of diabetes.   ASSESSMENT AND PLAN:   Other iron deficiency anemia  Vitamin D deficiency  Prediabetes  Class 3 severe obesity with serious comorbidity and body mass index (BMI) of 40.0 to 44.9 in adult, unspecified obesity type (HCC)  PLAN:  Iron Deficiency Anemia The diagnosis of Iron deficiency anemia was discussed with Alexis Jones and was explained in detail. She was given suggestions of iron rich foods and. Alexis Jones agrees to continue her multivitamin with iron and an iron rich diet. Labs will be rechecked in 3 months and Alexis Jones agrees to follow up in 2 weeks.  Vitamin D Deficiency Alexis Jones was informed that low vitamin D levels contribute to fatigue and are associated with obesity, breast, and colon cancer. Alexis Jones agrees to continue to her multivitamin and to restart her prescription Vit D @50 ,000 IU weekly and will follow up for routine testing of vitamin D, at least 2-3 times per year. She was informed of the risk of over-replacement of vitamin D and agrees to not increase her dose unless she discusses this with [name] first. Alexis Jones agrees to follow up in 2 weeks as directed.  Pre-Diabetes Alexis Jones will continue to work on weight loss, exercise, and decreasing simple carbohydrates in her diet to help decrease the risk of diabetes. She was informed that eating too many simple carbohydrates or too many calories at one sitting increases the likelihood of GI side effects. Alexis Jones agreed to continue her diet and exercise and we will recheck labs in 3 months. Alexis Jones agreed to follow up with [name] as directed to monitor her progress in  2 weeks.   I spent > than 50% of the 25 minute visit on counseling as documented in the note.  Obesity Alexis Jones is currently in the action stage of change. As such, her goal is to continue with weight loss efforts. She has agreed to follow the Category 3 plan. Sent Pre-diabetes, Insulin Resistance, and Additional Breakfast and Lunch Options handouts via Mychart. Alexis Jones has been  instructed to work up to a goal of 150 minutes of combined cardio and strengthening exercise per week for weight loss and overall health benefits. We discussed the following Behavioral Modification Strategies today: work on meal planning and easy cooking plans, ways to avoid boredom eating, keeping healthy foods in the home, and ways to avoid night time snacking.  Alexis Jones has agreed to follow up with our clinic in 2 weeks. She was informed of the importance of frequent follow up visits to maximize her success with intensive lifestyle modifications for her multiple health conditions.  ALLERGIES: No Known Allergies  MEDICATIONS: Current Outpatient Medications on File Prior to Visit  Medication Sig Dispense Refill  . escitalopram (LEXAPRO) 10 MG tablet Take 1 tablet (10 mg total) by mouth daily. 90 tablet 4  . ibuprofen (ADVIL,MOTRIN) 200 MG tablet Take 200 mg by mouth every 8 (eight) hours as needed.    [name] levonorgestrel (MIRENA) 20 MCG/24HR IUD 1 each by Intrauterine route once. Placed 05/05/15    . Multiple Vitamins-Minerals (WOMENS MULTI VITAMIN & MINERAL PO) Take 1 tablet by mouth daily.    [name] omeprazole (PRILOSEC) 40 MG capsule Take 1 capsule (40 mg total) by mouth daily. 90 capsule 3  . spironolactone (ALDACTONE) 100 MG tablet Take 1 tablet by mouth daily.    . Vitamin D, Ergocalciferol, (DRISDOL) 1.25 MG (50000 UT) CAPS capsule Take 1 capsule (50,000 Units total) by mouth every 7 (seven) days. 12 capsule 0   No current facility-administered medications on file prior to visit.     PAST MEDICAL HISTORY: Past Medical History:  Diagnosis Date  . Anxiety   . Asthma   . Dyspnea   . Female bladder prolapse   . GERD (gastroesophageal reflux disease)   . Hiatal hernia   . High triglycerides   . HLD (hyperlipidemia)   . Left ovarian cyst   . Low HDL (under 40)   . Lower extremity edema   . Microcytic anemia   . PCOS (polycystic ovarian syndrome)   . Prediabetes   . Rectocele   .  Urinary incontinence   . Vitamin D deficiency     PAST SURGICAL HISTORY: Past Surgical History:  Procedure Laterality Date  . BREAST REDUCTION SURGERY  2000  . LIPOMA EXCISION  2015   left shoulder lipoma removed (2)  . TEAR DUCT PROBING  04/1979    SOCIAL HISTORY: Social History   Tobacco Use  . Smoking status: Never Smoker  . Smokeless tobacco: Never Used  Substance Use Topics  . Alcohol use: No  . Drug use: No    FAMILY HISTORY: Family History  Problem Relation Age of Onset  . Hypertension Mother   . Hyperlipidemia Mother   . Thyroid disease Mother   . Anxiety disorder Mother   . Obesity Mother   . Cancer Father        prostate cancer  . Mitral valve prolapse Father   . Obesity Father   . Breast cancer Maternal Aunt   . Ovarian cancer Maternal Grandmother   . Skin cancer Maternal Grandfather   .  Lung cancer Maternal Grandfather     ROS: Review of Systems  Constitutional: Positive for malaise/fatigue.  Cardiovascular: Negative for palpitations.    PHYSICAL EXAM: Pt in no acute distress  RECENT LABS AND TESTS: BMET    Component Value Date/Time   NA 136 05/17/2018 1101   K 4.3 05/17/2018 1101   CL 99 05/17/2018 1101   CO2 24 05/17/2018 1101   GLUCOSE 86 05/17/2018 1101   GLUCOSE 94 11/13/2009 0401   BUN 7 05/17/2018 1101   CREATININE 0.85 05/17/2018 1101   CALCIUM 9.2 05/17/2018 1101   GFRNONAA 86 05/17/2018 1101   GFRAA 99 05/17/2018 1101   Lab Results  Component Value Date   HGBA1C 5.8 (H) 05/17/2018   HGBA1C 5.9 (H) 04/24/2018   Lab Results  Component Value Date   INSULIN 13.0 05/17/2018   CBC    Component Value Date/Time   WBC 6.6 05/17/2018 1101   WBC 10.2 11/13/2009 0401   RBC 4.58 05/17/2018 1101   RBC 4.47 11/13/2009 0401   HGB 11.0 (L) 05/17/2018 1101   HCT 35.3 05/17/2018 1101   PLT 307 04/24/2018 1020   MCV 77 (L) 05/17/2018 1101   MCH 24.0 (L) 05/17/2018 1101   MCH 30.0 11/13/2009 0401   MCHC 31.2 (L) 05/17/2018 1101    MCHC 33.8 11/13/2009 0401   RDW 16.2 (H) 05/17/2018 1101   LYMPHSABS 1.7 05/17/2018 1101   MONOABS 1.1 (H) 11/13/2009 0401   EOSABS 0.1 05/17/2018 1101   BASOSABS 0.0 05/17/2018 1101   Iron/TIBC/Ferritin/ %Sat    Component Value Date/Time   IRON 41 04/27/2018 1104   TIBC 350 04/27/2018 1104   FERRITIN 8 (L) 04/27/2018 1104   IRONPCTSAT 12 (L) 04/27/2018 1104   Lipid Panel     Component Value Date/Time   CHOL 176 04/24/2018 1020   TRIG 234 (H) 04/24/2018 1020   HDL 25 (L) 04/24/2018 1020   CHOLHDL 7.0 (H) 04/24/2018 1020   LDLCALC 104 (H) 04/24/2018 1020   Hepatic Function Panel     Component Value Date/Time   PROT 6.8 05/17/2018 1101   ALBUMIN 4.2 05/17/2018 1101   AST 12 05/17/2018 1101   ALT 13 05/17/2018 1101   ALKPHOS 71 05/17/2018 1101   BILITOT 0.3 05/17/2018 1101      Component Value Date/Time   TSH 2.420 05/17/2018 1101   TSH 2.670 04/24/2018 1020   Results for SHAKIARA, LUKIC (MRN [id number]) as of 06/05/2018 07:31  Ref. Range 05/17/2018 11:01  Vitamin D, 25-Hydroxy Latest Ref Range: 30.0 - 100.0 ng/mL 33.5     I, Marcille Blanco, CMA, am acting as transcriptionist for Starlyn Skeans, MD I have reviewed the above documentation for accuracy and completeness, and I agree with the above. -Dennard Nip, MD

## 2018-06-06 ENCOUNTER — Encounter (INDEPENDENT_AMBULATORY_CARE_PROVIDER_SITE_OTHER): Payer: Self-pay | Admitting: Family Medicine

## 2018-06-07 NOTE — Telephone Encounter (Signed)
Please advise 

## 2018-06-15 ENCOUNTER — Telehealth: Payer: Self-pay | Admitting: *Deleted

## 2018-06-15 ENCOUNTER — Ambulatory Visit (INDEPENDENT_AMBULATORY_CARE_PROVIDER_SITE_OTHER): Payer: BC Managed Care – PPO | Admitting: Family Medicine

## 2018-06-15 ENCOUNTER — Other Ambulatory Visit: Payer: Self-pay

## 2018-06-15 ENCOUNTER — Encounter (INDEPENDENT_AMBULATORY_CARE_PROVIDER_SITE_OTHER): Payer: Self-pay | Admitting: Family Medicine

## 2018-06-15 ENCOUNTER — Encounter: Payer: Self-pay | Admitting: Obstetrics & Gynecology

## 2018-06-15 DIAGNOSIS — F418 Other specified anxiety disorders: Secondary | ICD-10-CM | POA: Diagnosis not present

## 2018-06-15 DIAGNOSIS — Z6841 Body Mass Index (BMI) 40.0 and over, adult: Secondary | ICD-10-CM | POA: Diagnosis not present

## 2018-06-15 DIAGNOSIS — E66813 Obesity, class 3: Secondary | ICD-10-CM

## 2018-06-15 NOTE — Telephone Encounter (Signed)
Hi Dr. Sabra Heck,    I hope you and yours are well. I'm great! I'm down 11 pounds on Dr. Migdalia Dk program. Thank you for the referral!    I'm messaging you to let you know I think I know the cause of my anemia. I have never been anemic before. However, in November I gave a power red donation at a [employer] blood drive. A Power Red donation is donating 2 units of red blood cells. I made power red donation in spring of 2019 as well.    I would have thought that donating in November and seeing you in February that I would have recovered. Does being anemic 3 months after point to a deeper issue?    Thanks and stay safe!  Darlin Priestly

## 2018-06-15 NOTE — Progress Notes (Signed)
Office: [phone]  /  Fax: [phone] TeleHealth Visit:  Alexis Jones has verbally consented to this TeleHealth visit today. The patient is located at home, the provider is located at the [facility] office. The participants in this visit include the listed provider and patient. [name] was unable to use realtime audiovisual technology today and the Telehealth visit was conducted via telephone.  HPI:   Chief Complaint: OBESITY Alexis Jones is here to discuss her progress with her obesity treatment plan. She is on the Category 3 plan and is following her eating plan approximately 75 % of the time. She states she is walking 15 minutes 3 times per week. [name] has continued to do well with weight loss even during isolation. She thinks that she has lost another 1.5 pounds since our last visit. Her hunger is controlled and she is working on meal planning. She indulged in some Easter temptations, but is now back on track.  We were unable to weigh the patient today for this TeleHealth visit. She feels as if she has lost weight since her last visit. She has lost 0 lbs since starting treatment with [name].  Depression with Anxiety Icyss started on Lexapro. She is doing well mentally during the [place] isolation. She is keeping herself busy avoiding boredom snacking and getting some exercise walking her dog. She has been working on behavior modification techniques to help reduce her emotional eating and has been somewhat successful. She shows no sign of suicidal or homicidal ideations.  ASSESSMENT AND PLAN:  Depression with anxiety  Class 3 severe obesity with serious comorbidity and body mass index (BMI) of 40.0 to 44.9 in adult, unspecified obesity type ([place])  PLAN:  Depression with Anxiety We discussed behavior modification techniques today to help Alexis Jones deal with her emotional eating and depression. She has agreed to continue to take Lexapro 10 mg and to  continue to be mindful of her eating. Alexis Jones agreed to follow up as directed in 2 weeks.  I spent > than 50% of the 15 minute visit on counseling as documented in the note.  Obesity Alexis Jones is currently in the action stage of change. As such, her goal is to continue with weight loss efforts. She has agreed to follow the Category 3 plan. Alexis Jones has been instructed to work up to a goal of 150 minutes of combined cardio and strengthening exercise per week for weight loss and overall health benefits. We discussed the following Behavioral Modification Strategies today: work on meal planning and easy cooking plans, emotional eating strategies, and ways to avoid boredom eating.  Alexis Jones has agreed to follow up with our clinic in 2 weeks. She was informed of the importance of frequent follow up visits to maximize her success with intensive lifestyle modifications for her multiple health conditions.  ALLERGIES: No Known Allergies  MEDICATIONS: Current Outpatient Medications on File Prior to Visit  Medication Sig Dispense Refill   escitalopram (LEXAPRO) 10 MG tablet Take 1 tablet (10 mg total) by mouth daily. 90 tablet 4   ibuprofen (ADVIL,MOTRIN) 200 MG tablet Take 200 mg by mouth every 8 (eight) hours as needed.     levonorgestrel (MIRENA) 20 MCG/24HR IUD 1 each by Intrauterine route once. Placed 05/05/15     Multiple Vitamins-Minerals (WOMENS MULTI VITAMIN & MINERAL PO) Take 1 tablet by mouth daily.     omeprazole (PRILOSEC) 40 MG capsule Take 1 capsule (40 mg total) by mouth daily. 90 capsule 3   spironolactone (ALDACTONE) 100 MG tablet  Take 1 tablet by mouth daily.     Vitamin D, Ergocalciferol, (DRISDOL) 1.25 MG (50000 UT) CAPS capsule Take 1 capsule (50,000 Units total) by mouth every 7 (seven) days. 12 capsule 0   No current facility-administered medications on file prior to visit.     PAST MEDICAL HISTORY: Past Medical History:  Diagnosis Date   Anxiety    Asthma      Dyspnea    Female bladder prolapse    GERD (gastroesophageal reflux disease)    Hiatal hernia    High triglycerides    HLD (hyperlipidemia)    Left ovarian cyst    Low HDL (under 40)    Lower extremity edema    Microcytic anemia    PCOS (polycystic ovarian syndrome)    Prediabetes    Rectocele    Urinary incontinence    Vitamin D deficiency     PAST SURGICAL HISTORY: Past Surgical History:  Procedure Laterality Date   BREAST REDUCTION SURGERY  2000   LIPOMA EXCISION  2015   left shoulder lipoma removed (2)   TEAR DUCT PROBING  04/1979    SOCIAL HISTORY: Social History   Tobacco Use   Smoking status: Never Smoker   Smokeless tobacco: Never Used  Substance Use Topics   Alcohol use: No   Drug use: No    FAMILY HISTORY: Family History  Problem Relation Age of Onset   Hypertension Mother    Hyperlipidemia Mother    Thyroid disease Mother    Anxiety disorder Mother    Obesity Mother    Cancer Father        prostate cancer   Mitral valve prolapse Father    Obesity Father    Breast cancer Maternal Aunt    Ovarian cancer Maternal Grandmother    Skin cancer Maternal Grandfather    Lung cancer Maternal Grandfather     ROS: Review of Systems  Psychiatric/Behavioral: Positive for depression. Negative for suicidal ideas. The patient is nervous/anxious.        Negative for homicidal ideations.    PHYSICAL EXAM: Pt in no acute distress  RECENT LABS AND TESTS: BMET    Component Value Date/Time   NA 136 05/17/2018 1101   K 4.3 05/17/2018 1101   CL 99 05/17/2018 1101   CO2 24 05/17/2018 1101   GLUCOSE 86 05/17/2018 1101   GLUCOSE 94 11/13/2009 0401   BUN 7 05/17/2018 1101   CREATININE 0.85 05/17/2018 1101   CALCIUM 9.2 05/17/2018 1101   GFRNONAA 86 05/17/2018 1101   GFRAA 99 05/17/2018 1101   Lab Results  Component Value Date   HGBA1C 5.8 (H) 05/17/2018   HGBA1C 5.9 (H) 04/24/2018   Lab Results  Component Value  Date   INSULIN 13.0 05/17/2018   CBC    Component Value Date/Time   WBC 6.6 05/17/2018 1101   WBC 10.2 11/13/2009 0401   RBC 4.58 05/17/2018 1101   RBC 4.47 11/13/2009 0401   HGB 11.0 (L) 05/17/2018 1101   HCT 35.3 05/17/2018 1101   PLT 307 04/24/2018 1020   MCV 77 (L) 05/17/2018 1101   MCH 24.0 (L) 05/17/2018 1101   MCH 30.0 11/13/2009 0401   MCHC 31.2 (L) 05/17/2018 1101   MCHC 33.8 11/13/2009 0401   RDW 16.2 (H) 05/17/2018 1101   LYMPHSABS 1.7 05/17/2018 1101   MONOABS 1.1 (H) 11/13/2009 0401   EOSABS 0.1 05/17/2018 1101   BASOSABS 0.0 05/17/2018 1101   Iron/TIBC/Ferritin/ %Sat    Component Value  Date/Time   IRON 41 04/27/2018 1104   TIBC 350 04/27/2018 1104   FERRITIN 8 (L) 04/27/2018 1104   IRONPCTSAT 12 (L) 04/27/2018 1104   Lipid Panel     Component Value Date/Time   CHOL 176 04/24/2018 1020   TRIG 234 (H) 04/24/2018 1020   HDL 25 (L) 04/24/2018 1020   CHOLHDL 7.0 (H) 04/24/2018 1020   LDLCALC 104 (H) 04/24/2018 1020   Hepatic Function Panel     Component Value Date/Time   PROT 6.8 05/17/2018 1101   ALBUMIN 4.2 05/17/2018 1101   AST 12 05/17/2018 1101   ALT 13 05/17/2018 1101   ALKPHOS 71 05/17/2018 1101   BILITOT 0.3 05/17/2018 1101      Component Value Date/Time   TSH 2.420 05/17/2018 1101   TSH 2.670 04/24/2018 1020   Results for WYNEE, MATARAZZO (MRN [id number]) as of 06/15/2018 09:41  Ref. Range 05/17/2018 11:01  Vitamin D, 25-Hydroxy Latest Ref Range: 30.0 - 100.0 ng/mL 33.5     I, Marcille Blanco, CMA, am acting as transcriptionist for Starlyn Skeans, MD I have reviewed the above documentation for accuracy and completeness, and I agree with the above. -Dennard Nip, MD

## 2018-06-15 NOTE — Telephone Encounter (Signed)
Advised of message as seen below from [place]. Patient verbalizes understanding. Encounter closed.

## 2018-06-15 NOTE — Telephone Encounter (Signed)
Patient's anemia looks like iron deficiency.  This can come from blood loss, like through heavy menstrual bleeding or other sources of blood loss like blood donation.  This can also be the result of dietary deficiency in iron.  It looks like Dr. Leafy Ro is addressing this.   If she is having difficulty controlling her anemia, she could have a referral to hematology/oncology for further evaluation and a potential ferritin transfusion.   She may want to stop doing blood donation temporarily, until her anemia is under control.

## 2018-06-15 NOTE — Telephone Encounter (Signed)
Routing to Dr.Silva for review.

## 2018-07-05 ENCOUNTER — Other Ambulatory Visit: Payer: Self-pay

## 2018-07-05 ENCOUNTER — Encounter (INDEPENDENT_AMBULATORY_CARE_PROVIDER_SITE_OTHER): Payer: Self-pay | Admitting: Family Medicine

## 2018-07-05 ENCOUNTER — Ambulatory Visit (INDEPENDENT_AMBULATORY_CARE_PROVIDER_SITE_OTHER): Payer: BC Managed Care – PPO | Admitting: Family Medicine

## 2018-07-05 DIAGNOSIS — F3289 Other specified depressive episodes: Secondary | ICD-10-CM

## 2018-07-05 DIAGNOSIS — Z6841 Body Mass Index (BMI) 40.0 and over, adult: Secondary | ICD-10-CM | POA: Diagnosis not present

## 2018-07-05 NOTE — Progress Notes (Signed)
Office: [phone]  /  Fax: [phone] TeleHealth Visit:  Alexis Jones has verbally consented to this TeleHealth visit today. The patient is located at home, the provider is located at the [facility] office. The participants in this visit include the listed provider and patient. The visit was conducted today via [email or web address].  HPI:   Chief Complaint: OBESITY Alexis Jones is here to discuss her progress with her obesity treatment plan. She is on the Category 3 plan and is following her eating plan approximately 90 % of the time. She states she is walking 15 minutes 3 times per week. Alexis Jones states that she is doing well with weight loss and she thinks that she has lost 3 to 4 pounds since our last visit. She feels that her hunger is controlled. Alexis Jones is walking her dogs more and will try to continue this on nice weather days.  We were unable to weigh the patient today for this TeleHealth visit. She feels as if she has lost weight since her last visit. She has lost 0 lbs since starting treatment with [name].  Depression with emotional eating behaviors Lilleigh's mood is stable on Lexapro. She feels that she is doing well avoiding too much emotional eating and notes energy and sleep is good. She has been working on behavior modification techniques to help reduce her emotional eating and has been somewhat successful. She shows no sign of suicidal or homicidal ideations.  Depression screen PHQ 2/9 05/17/2018  Decreased Interest 2  Down, Depressed, Hopeless 1  PHQ - 2 Score 3  Altered sleeping 1  Tired, decreased energy 2  Change in appetite 1  Feeling bad or failure about yourself  0  Trouble concentrating 0  Moving slowly or fidgety/restless 0  Suicidal thoughts 0  PHQ-9 Score 7  Difficult doing work/chores Not difficult at all   ASSESSMENT AND PLAN:  Other depression - with emotional eating  Class 3 severe obesity with serious comorbidity and body mass index  (BMI) of 40.0 to 44.9 in adult, unspecified obesity type ([place])  PLAN:  Depression with Emotional Eating Behaviors We discussed behavior modification techniques today to help Alexis Jones deal with her emotional eating and depression. She has agreed to take Lexapro and to be aware of stress eating temptations. She agreed to follow up as directed in 2 weeks.  I spent > than 50% of the 25 minute visit on counseling as documented in the note.  Obesity Marcha is currently in the action stage of change. As such, her goal is to continue with weight loss efforts. She has agreed to follow the Category 3 plan and keep a food journal of 300 to 450 calories and 25+ grams of protein for breakfast. Alexis Jones has been instructed to work up to a goal of 150 minutes of combined cardio and strengthening exercise per week for weight loss and overall health benefits. We discussed the following Behavioral Modification Strategies today: work on meal planning and easy cooking plans, emotional eating strategies, no skipping meals, and ways to avoid boredom eating.  Alexis Jones has agreed to follow up with our clinic in 2 weeks. She was informed of the importance of frequent follow up visits to maximize her success with intensive lifestyle modifications for her multiple health conditions.  ALLERGIES: No Known Allergies  MEDICATIONS: Current Outpatient Medications on File Prior to Visit  Medication Sig Dispense Refill  . escitalopram (LEXAPRO) 10 MG tablet Take 1 tablet (10 mg total) by mouth daily. 90 tablet 4  .  ibuprofen (ADVIL,MOTRIN) 200 MG tablet Take 200 mg by mouth every 8 (eight) hours as needed.    [name] levonorgestrel (MIRENA) 20 MCG/24HR IUD 1 each by Intrauterine route once. Placed 05/05/15    . Multiple Vitamins-Minerals (WOMENS MULTI VITAMIN & MINERAL PO) Take 1 tablet by mouth daily.    [name] omeprazole (PRILOSEC) 40 MG capsule Take 1 capsule (40 mg total) by mouth daily. 90 capsule 3  . spironolactone  (ALDACTONE) 100 MG tablet Take 1 tablet by mouth daily.    . Vitamin D, Ergocalciferol, (DRISDOL) 1.25 MG (50000 UT) CAPS capsule Take 1 capsule (50,000 Units total) by mouth every 7 (seven) days. 12 capsule 0   No current facility-administered medications on file prior to visit.     PAST MEDICAL HISTORY: Past Medical History:  Diagnosis Date  . Anxiety   . Asthma   . Dyspnea   . Female bladder prolapse   . GERD (gastroesophageal reflux disease)   . Hiatal hernia   . High triglycerides   . HLD (hyperlipidemia)   . Left ovarian cyst   . Low HDL (under 40)   . Lower extremity edema   . Microcytic anemia   . PCOS (polycystic ovarian syndrome)   . Prediabetes   . Rectocele   . Urinary incontinence   . Vitamin D deficiency     PAST SURGICAL HISTORY: Past Surgical History:  Procedure Laterality Date  . BREAST REDUCTION SURGERY  2000  . LIPOMA EXCISION  2015   left shoulder lipoma removed (2)  . TEAR DUCT PROBING  04/1979    SOCIAL HISTORY: Social History   Tobacco Use  . Smoking status: Never Smoker  . Smokeless tobacco: Never Used  Substance Use Topics  . Alcohol use: No  . Drug use: No    FAMILY HISTORY: Family History  Problem Relation Age of Onset  . Hypertension Mother   . Hyperlipidemia Mother   . Thyroid disease Mother   . Anxiety disorder Mother   . Obesity Mother   . Cancer Father        prostate cancer  . Mitral valve prolapse Father   . Obesity Father   . Breast cancer Maternal Aunt   . Ovarian cancer Maternal Grandmother   . Skin cancer Maternal Grandfather   . Lung cancer Maternal Grandfather     ROS: Review of Systems  Psychiatric/Behavioral: Positive for depression. Negative for suicidal ideas.       Negative for homicidal ideations.    PHYSICAL EXAM: Pt in no acute distress  RECENT LABS AND TESTS: BMET    Component Value Date/Time   NA 136 05/17/2018 1101   K 4.3 05/17/2018 1101   CL 99 05/17/2018 1101   CO2 24 05/17/2018  1101   GLUCOSE 86 05/17/2018 1101   GLUCOSE 94 11/13/2009 0401   BUN 7 05/17/2018 1101   CREATININE 0.85 05/17/2018 1101   CALCIUM 9.2 05/17/2018 1101   GFRNONAA 86 05/17/2018 1101   GFRAA 99 05/17/2018 1101   Lab Results  Component Value Date   HGBA1C 5.8 (H) 05/17/2018   HGBA1C 5.9 (H) 04/24/2018   Lab Results  Component Value Date   INSULIN 13.0 05/17/2018   CBC    Component Value Date/Time   WBC 6.6 05/17/2018 1101   WBC 10.2 11/13/2009 0401   RBC 4.58 05/17/2018 1101   RBC 4.47 11/13/2009 0401   HGB 11.0 (L) 05/17/2018 1101   HCT 35.3 05/17/2018 1101   PLT 307 04/24/2018 1020  MCV 77 (L) 05/17/2018 1101   MCH 24.0 (L) 05/17/2018 1101   MCH 30.0 11/13/2009 0401   MCHC 31.2 (L) 05/17/2018 1101   MCHC 33.8 11/13/2009 0401   RDW 16.2 (H) 05/17/2018 1101   LYMPHSABS 1.7 05/17/2018 1101   MONOABS 1.1 (H) 11/13/2009 0401   EOSABS 0.1 05/17/2018 1101   BASOSABS 0.0 05/17/2018 1101   Iron/TIBC/Ferritin/ %Sat    Component Value Date/Time   IRON 41 04/27/2018 1104   TIBC 350 04/27/2018 1104   FERRITIN 8 (L) 04/27/2018 1104   IRONPCTSAT 12 (L) 04/27/2018 1104   Lipid Panel     Component Value Date/Time   CHOL 176 04/24/2018 1020   TRIG 234 (H) 04/24/2018 1020   HDL 25 (L) 04/24/2018 1020   CHOLHDL 7.0 (H) 04/24/2018 1020   LDLCALC 104 (H) 04/24/2018 1020   Hepatic Function Panel     Component Value Date/Time   PROT 6.8 05/17/2018 1101   ALBUMIN 4.2 05/17/2018 1101   AST 12 05/17/2018 1101   ALT 13 05/17/2018 1101   ALKPHOS 71 05/17/2018 1101   BILITOT 0.3 05/17/2018 1101      Component Value Date/Time   TSH 2.420 05/17/2018 1101   TSH 2.670 04/24/2018 1020   Results for NIOMIE, ENGLERT (MRN [id number]) as of 07/05/2018 16:29  Ref. Range 05/17/2018 11:01  Vitamin D, 25-Hydroxy Latest Ref Range: 30.0 - 100.0 ng/mL 33.5     I, Marcille Blanco, CMA, am acting as transcriptionist for Starlyn Skeans, MD I have reviewed the above documentation for  accuracy and completeness, and I agree with the above. -Dennard Nip, MD

## 2018-07-17 ENCOUNTER — Encounter (INDEPENDENT_AMBULATORY_CARE_PROVIDER_SITE_OTHER): Payer: Self-pay | Admitting: Family Medicine

## 2018-07-20 ENCOUNTER — Other Ambulatory Visit: Payer: Self-pay

## 2018-07-20 ENCOUNTER — Ambulatory Visit (INDEPENDENT_AMBULATORY_CARE_PROVIDER_SITE_OTHER): Payer: BC Managed Care – PPO | Admitting: Family Medicine

## 2018-07-20 ENCOUNTER — Ambulatory Visit (INDEPENDENT_AMBULATORY_CARE_PROVIDER_SITE_OTHER): Payer: BC Managed Care – PPO

## 2018-07-20 ENCOUNTER — Encounter (INDEPENDENT_AMBULATORY_CARE_PROVIDER_SITE_OTHER): Payer: Self-pay | Admitting: Family Medicine

## 2018-07-20 ENCOUNTER — Encounter: Payer: Self-pay | Admitting: Obstetrics & Gynecology

## 2018-07-20 ENCOUNTER — Ambulatory Visit: Payer: BC Managed Care – PPO | Admitting: Obstetrics & Gynecology

## 2018-07-20 VITALS — BP 116/64 | HR 68 | Temp 97.7°F | Ht 66.0 in | Wt 250.0 lb

## 2018-07-20 DIAGNOSIS — N83202 Unspecified ovarian cyst, left side: Secondary | ICD-10-CM | POA: Diagnosis not present

## 2018-07-20 DIAGNOSIS — F3289 Other specified depressive episodes: Secondary | ICD-10-CM | POA: Diagnosis not present

## 2018-07-20 DIAGNOSIS — E559 Vitamin D deficiency, unspecified: Secondary | ICD-10-CM

## 2018-07-20 DIAGNOSIS — Z6841 Body Mass Index (BMI) 40.0 and over, adult: Secondary | ICD-10-CM | POA: Diagnosis not present

## 2018-07-20 NOTE — Progress Notes (Signed)
38 y.o. [id number] Married White or Caucasian female here for pelvic ultrasound due to complex ovarian cyst that was noted due to .  Finished her Vit D a week or two ago.  Will have this checked today.    No LMP recorded. (Menstrual status: IUD).  Contraception: IUD  Findings:  UTERUS: 10.6 x 5.8 x 4.6cm with IUD in correct placement EMS: 5.52mm ADNEXA: Left ovary: 2.8cm x 2.3cm x 2.2cm       Right ovary: 4.0 x 3. X 2.5cm with 2.7cm x 1.9cm and 2.2.cm CUL DE SAC: no free fluid   Discussion:  Findings reviewed.  Cyst on left ovary is fully resolved.  IUD is in the correct location.  New simple cyst on the right ovary.  Follow up is not needed at this time..  Assessment:  Resolution of left ovarian cyst IUD in correct location Vit D deficiency  Plan:  Follow up for AEX that is already scheduled. Vit D level obtained today  ~15 minutes spent with patient >50% of time was in face to face discussion of above.

## 2018-07-21 LAB — VITAMIN D 25 HYDROXY (VIT D DEFICIENCY, FRACTURES): Vit D, 25-Hydroxy: 52.8 ng/mL (ref 30.0–100.0)

## 2018-07-25 NOTE — Progress Notes (Signed)
Office: [phone]  /  Fax: [phone] TeleHealth Visit:  Alexis Jones has verbally consented to this TeleHealth visit today. The patient is located at work, the provider is located at the [employer] [facility] office. The participants in this visit include the listed provider and patient. Alexis Jones was unable to use realtime audiovisual technology today and the telehealth visit was conducted via telephone.  HPI:   Chief Complaint: OBESITY [name] is here to discuss her progress with her obesity treatment plan. She is on the Category 3 plan and is following her eating plan approximately 80 % of the time. She states she is walking 15 minutes 3 to 4 times per week. [name] continues to do well with her Category 3 plan. She has maintained her weight for the last 2 weeks. Her family is supportive of her eating healthier and are eating healthier as well.  We were unable to weigh the patient today for this TeleHealth visit. She feels as if she has maintained weight since her last visit. She has lost 0 lbs since starting treatment with [name].  Depression with emotional eating behaviors Teletha's mood is stable on Lexapro. She is doing well avoiding comfort eating and is sleeping better. She has been working on behavior modification techniques to help reduce her emotional eating and has been somewhat successful.   Depression screen PHQ 2/9 05/17/2018  Decreased Interest 2  Down, Depressed, Hopeless 1  PHQ - 2 Score 3  Altered sleeping 1  Tired, decreased energy 2  Change in appetite 1  Feeling bad or failure about yourself  0  Trouble concentrating 0  Moving slowly or fidgety/restless 0  Suicidal thoughts 0  PHQ-9 Score 7  Difficult doing work/chores Not difficult at all   ASSESSMENT AND PLAN:  Other depression - with emotional eating  Class 3 severe obesity with serious comorbidity and body mass index (BMI) of 40.0 to 44.9 in adult, unspecified obesity type ([place])   PLAN:  Depression with Emotional Eating Behaviors We discussed behavior modification techniques today to help [name] deal with her emotional eating and depression. She has agreed to continue to take Lexapro and we discussed emotional eating strategies. [name] agreed to follow up as directed in 2 weeks.  I spent > than 50% of the 25 minute visit on counseling as documented in the note.  Obesity [name] is currently in the action stage of change. As such, her goal is to continue with weight loss efforts. She has agreed to change to keep a food journal with 1300 to 1600 calories and 85+ grams of protein.  [name] has been instructed to work up to a goal of 150 minutes of combined cardio and strengthening exercise per week for weight loss and overall health benefits. We discussed the following Behavioral Modification Strategies today: work on meal planning and easy cooking plans, keep a strict food journal, and emotional eating strategies.  [name] has agreed to follow up with our clinic in 2 weeks. She was informed of the importance of frequent follow up visits to maximize her success with intensive lifestyle modifications for her multiple health conditions.  ALLERGIES: No Known Allergies  MEDICATIONS: Current Outpatient Medications on File Prior to Visit  Medication Sig Dispense Refill  . escitalopram (LEXAPRO) 10 MG tablet Take 1 tablet (10 mg total) by mouth daily. 90 tablet 4  . ibuprofen (ADVIL,MOTRIN) 200 MG tablet Take 200 mg by mouth every 8 (eight) hours as needed.    [name] levonorgestrel (MIRENA) 20 MCG/24HR IUD  1 each by Intrauterine route once. Placed 05/05/15    . Multiple Vitamins-Minerals (WOMENS MULTI VITAMIN & MINERAL PO) Take 1 tablet by mouth daily.    [name] omeprazole (PRILOSEC) 40 MG capsule Take 1 capsule (40 mg total) by mouth daily. 90 capsule 3  . spironolactone (ALDACTONE) 100 MG tablet Take 1 tablet by mouth daily.     No current facility-administered  medications on file prior to visit.     PAST MEDICAL HISTORY: Past Medical History:  Diagnosis Date  . Anxiety   . Asthma   . Dyspnea   . Female bladder prolapse   . GERD (gastroesophageal reflux disease)   . Hiatal hernia   . High triglycerides   . HLD (hyperlipidemia)   . Left ovarian cyst   . Low HDL (under 40)   . Lower extremity edema   . Microcytic anemia   . PCOS (polycystic ovarian syndrome)   . Prediabetes   . Rectocele   . Urinary incontinence   . Vitamin D deficiency     PAST SURGICAL HISTORY: Past Surgical History:  Procedure Laterality Date  . BREAST REDUCTION SURGERY  2000  . LIPOMA EXCISION  2015   left shoulder lipoma removed (2)  . TEAR DUCT PROBING  04/1979    SOCIAL HISTORY: Social History   Tobacco Use  . Smoking status: Never Smoker  . Smokeless tobacco: Never Used  Substance Use Topics  . Alcohol use: No  . Drug use: No    FAMILY HISTORY: Family History  Problem Relation Age of Onset  . Hypertension Mother   . Hyperlipidemia Mother   . Thyroid disease Mother   . Anxiety disorder Mother   . Obesity Mother   . Cancer Father        prostate cancer  . Mitral valve prolapse Father   . Obesity Father   . Breast cancer Maternal Aunt   . Ovarian cancer Maternal Grandmother   . Skin cancer Maternal Grandfather   . Lung cancer Maternal Grandfather     ROS: Review of Systems  Psychiatric/Behavioral: Positive for depression.    PHYSICAL EXAM: Pt in no acute distress  RECENT LABS AND TESTS: BMET    Component Value Date/Time   NA 136 05/17/2018 1101   K 4.3 05/17/2018 1101   CL 99 05/17/2018 1101   CO2 24 05/17/2018 1101   GLUCOSE 86 05/17/2018 1101   GLUCOSE 94 11/13/2009 0401   BUN 7 05/17/2018 1101   CREATININE 0.85 05/17/2018 1101   CALCIUM 9.2 05/17/2018 1101   GFRNONAA 86 05/17/2018 1101   GFRAA 99 05/17/2018 1101   Lab Results  Component Value Date   HGBA1C 5.8 (H) 05/17/2018   HGBA1C 5.9 (H) 04/24/2018   Lab  Results  Component Value Date   INSULIN 13.0 05/17/2018   CBC    Component Value Date/Time   WBC 6.6 05/17/2018 1101   WBC 10.2 11/13/2009 0401   RBC 4.58 05/17/2018 1101   RBC 4.47 11/13/2009 0401   HGB 11.0 (L) 05/17/2018 1101   HCT 35.3 05/17/2018 1101   PLT 307 04/24/2018 1020   MCV 77 (L) 05/17/2018 1101   MCH 24.0 (L) 05/17/2018 1101   MCH 30.0 11/13/2009 0401   MCHC 31.2 (L) 05/17/2018 1101   MCHC 33.8 11/13/2009 0401   RDW 16.2 (H) 05/17/2018 1101   LYMPHSABS 1.7 05/17/2018 1101   MONOABS 1.1 (H) 11/13/2009 0401   EOSABS 0.1 05/17/2018 1101   BASOSABS 0.0 05/17/2018 1101  Iron/TIBC/Ferritin/ %Sat    Component Value Date/Time   IRON 41 04/27/2018 1104   TIBC 350 04/27/2018 1104   FERRITIN 8 (L) 04/27/2018 1104   IRONPCTSAT 12 (L) 04/27/2018 1104   Lipid Panel     Component Value Date/Time   CHOL 176 04/24/2018 1020   TRIG 234 (H) 04/24/2018 1020   HDL 25 (L) 04/24/2018 1020   CHOLHDL 7.0 (H) 04/24/2018 1020   LDLCALC 104 (H) 04/24/2018 1020   Hepatic Function Panel     Component Value Date/Time   PROT 6.8 05/17/2018 1101   ALBUMIN 4.2 05/17/2018 1101   AST 12 05/17/2018 1101   ALT 13 05/17/2018 1101   ALKPHOS 71 05/17/2018 1101   BILITOT 0.3 05/17/2018 1101      Component Value Date/Time   TSH 2.420 05/17/2018 1101   TSH 2.670 04/24/2018 1020   Results for CAMRON, ESSMAN (MRN [id number]) as of 07/25/2018 16:13  Ref. Range 07/20/2018 14:01  Vitamin D, 25-Hydroxy Latest Ref Range: 30.0 - 100.0 ng/mL 52.8     I, Marcille Blanco, CMA, am acting as transcriptionist for Starlyn Skeans, MD I have reviewed the above documentation for accuracy and completeness, and I agree with the above. -Dennard Nip, MD

## 2018-07-31 ENCOUNTER — Encounter (INDEPENDENT_AMBULATORY_CARE_PROVIDER_SITE_OTHER): Payer: Self-pay | Admitting: Family Medicine

## 2018-08-03 ENCOUNTER — Telehealth: Payer: Self-pay | Admitting: Obstetrics & Gynecology

## 2018-08-03 ENCOUNTER — Encounter (INDEPENDENT_AMBULATORY_CARE_PROVIDER_SITE_OTHER): Payer: Self-pay | Admitting: Family Medicine

## 2018-08-03 ENCOUNTER — Ambulatory Visit (INDEPENDENT_AMBULATORY_CARE_PROVIDER_SITE_OTHER): Payer: BC Managed Care – PPO | Admitting: Family Medicine

## 2018-08-03 ENCOUNTER — Encounter: Payer: Self-pay | Admitting: Obstetrics & Gynecology

## 2018-08-03 ENCOUNTER — Other Ambulatory Visit: Payer: Self-pay | Admitting: *Deleted

## 2018-08-03 ENCOUNTER — Other Ambulatory Visit: Payer: Self-pay

## 2018-08-03 DIAGNOSIS — Z6841 Body Mass Index (BMI) 40.0 and over, adult: Secondary | ICD-10-CM

## 2018-08-03 DIAGNOSIS — E559 Vitamin D deficiency, unspecified: Secondary | ICD-10-CM

## 2018-08-03 MED ORDER — VITAMIN D (ERGOCALCIFEROL) 1.25 MG (50000 UNIT) PO CAPS: 50000.0000 [IU] | ORAL_CAPSULE | ORAL | 0 refills | Status: DC

## 2018-08-03 NOTE — Telephone Encounter (Signed)
Spoke with patient and advised can cancel lab appointment 08-11-18. Keep televisit with Dr.Beasley today. Patient states she spoke with Elie Goody earlier today regarding Vit D Rx and transitioning to OTC 5000IU daily.

## 2018-08-03 NOTE — Progress Notes (Signed)
Happy to help!

## 2018-08-03 NOTE — Telephone Encounter (Signed)
Returned patient's call. Left message for her to call me.(per Dr.Miller pt.can cancel lab appt.6/12, continue with Rx vit d for 2 more months or transition to OTC vit d 5000IU daily).

## 2018-08-03 NOTE — Progress Notes (Signed)
Office: [phone]  /  Fax: [phone] TeleHealth Visit:  Alexis Jones has verbally consented to this TeleHealth visit today. The patient is located at home, the provider is located at the [employer] [facility] office. The participants in this visit include the listed provider and patient. The visit was conducted today via doxy.me.  HPI:   Chief Complaint: OBESITY Alexis Jones is here to discuss her progress with her obesity treatment plan. She is keeping a food journal with 1300 to 1600 calories and 85+ grams of protein and is following her eating plan approximately 70 % of the time. She states she is walking 15 to 20 minutes 3 times per week. [name] has been doing better with journaling and she likes the freedom that she has with this. She is enjoying making new meals that her kids like too. She is maintaining her weight, but her clothes are looser.  We were unable to weigh the patient today for this TeleHealth visit. She feels as if she has lost weight since her last visit. She has lost 0 lbs since starting treatment with [name].  Vitamin D Deficiency Alexis Jones has a diagnosis of vitamin D deficiency. She is now at goal and notes reduced fatigue. Alexis Jones denies nausea, vomiting, or muscle weakness.  ASSESSMENT AND PLAN:  Vitamin D deficiency  Class 3 severe obesity with serious comorbidity and body mass index (BMI) of 40.0 to 44.9 in adult, unspecified obesity type ([place])  PLAN:  Vitamin D Deficiency Alexis Jones was informed that low vitamin D levels contribute to fatigue and are associated with obesity, breast, and colon cancer. Alexis Jones agrees to continue to take prescription Vit D @50 ,000 IU for 2 more months and then we will recheck labs. She agrees that she will follow up for routine testing of vitamin D, at least 2-3 times per year. She was informed of the risk of over-replacement of vitamin D and agrees to not increase her dose unless she discusses this with [name]  first. [name] agrees to follow up in 2 weeks as directed.  I spent > than 50% of the 25 minute visit on counseling as documented in the note.  Obesity Alexis Jones is currently in the action stage of change. As such, her goal is to continue with weight loss efforts. She has agreed to keep a food journal with 1300 to 1600 calories and 85+ grams of protein.  Alexis Jones has been instructed to work up to a goal of 150 minutes of combined cardio and strengthening exercise per week for weight loss and overall health benefits. We discussed the following Behavioral Modification Strategies today: increasing lean protein intake, decreasing simple carbohydrates, keeping healthy foods in the home, and work on meal planning and easy cooking plans.  Alexis Jones has agreed to follow up with our clinic in 2 weeks. She was informed of the importance of frequent follow up visits to maximize her success with intensive lifestyle modifications for her multiple health conditions.  ALLERGIES: No Known Allergies  MEDICATIONS: Current Outpatient Medications on File Prior to Visit  Medication Sig Dispense Refill   escitalopram (LEXAPRO) 10 MG tablet Take 1 tablet (10 mg total) by mouth daily. 90 tablet 4   ibuprofen (ADVIL,MOTRIN) 200 MG tablet Take 200 mg by mouth every 8 (eight) hours as needed.     levonorgestrel (MIRENA) 20 MCG/24HR IUD 1 each by Intrauterine route once. Placed 05/05/15     Multiple Vitamins-Minerals (WOMENS MULTI VITAMIN & MINERAL PO) Take 1 tablet by mouth daily.  omeprazole (PRILOSEC) 40 MG capsule Take 1 capsule (40 mg total) by mouth daily. 90 capsule 3   spironolactone (ALDACTONE) 100 MG tablet Take 1 tablet by mouth daily.     No current facility-administered medications on file prior to visit.     PAST MEDICAL HISTORY: Past Medical History:  Diagnosis Date   Anxiety    Asthma    Dyspnea    Female bladder prolapse    GERD (gastroesophageal reflux disease)    Hiatal  hernia    High triglycerides    HLD (hyperlipidemia)    Left ovarian cyst    Low HDL (under 40)    Lower extremity edema    Microcytic anemia    PCOS (polycystic ovarian syndrome)    Prediabetes    Rectocele    Urinary incontinence    Vitamin D deficiency     PAST SURGICAL HISTORY: Past Surgical History:  Procedure Laterality Date   BREAST REDUCTION SURGERY  2000   LIPOMA EXCISION  2015   left shoulder lipoma removed (2)   TEAR DUCT PROBING  04/1979    SOCIAL HISTORY: Social History   Tobacco Use   Smoking status: Never Smoker   Smokeless tobacco: Never Used  Substance Use Topics   Alcohol use: No   Drug use: No    FAMILY HISTORY: Family History  Problem Relation Age of Onset   Hypertension Mother    Hyperlipidemia Mother    Thyroid disease Mother    Anxiety disorder Mother    Obesity Mother    Cancer Father        prostate cancer   Mitral valve prolapse Father    Obesity Father    Breast cancer Maternal Aunt    Ovarian cancer Maternal Grandmother    Skin cancer Maternal Grandfather    Lung cancer Maternal Grandfather     ROS: Review of Systems  Constitutional: Positive for malaise/fatigue.  Gastrointestinal: Negative for nausea and vomiting.  Musculoskeletal:       Negative for muscle weakness.    PHYSICAL EXAM: Pt in no acute distress  RECENT LABS AND TESTS: BMET    Component Value Date/Time   NA 136 05/17/2018 1101   K 4.3 05/17/2018 1101   CL 99 05/17/2018 1101   CO2 24 05/17/2018 1101   GLUCOSE 86 05/17/2018 1101   GLUCOSE 94 11/13/2009 0401   BUN 7 05/17/2018 1101   CREATININE 0.85 05/17/2018 1101   CALCIUM 9.2 05/17/2018 1101   GFRNONAA 86 05/17/2018 1101   GFRAA 99 05/17/2018 1101   Lab Results  Component Value Date   HGBA1C 5.8 (H) 05/17/2018   HGBA1C 5.9 (H) 04/24/2018   Lab Results  Component Value Date   INSULIN 13.0 05/17/2018   CBC    Component Value Date/Time   WBC 6.6 05/17/2018  1101   WBC 10.2 11/13/2009 0401   RBC 4.58 05/17/2018 1101   RBC 4.47 11/13/2009 0401   HGB 11.0 (L) 05/17/2018 1101   HCT 35.3 05/17/2018 1101   PLT 307 04/24/2018 1020   MCV 77 (L) 05/17/2018 1101   MCH 24.0 (L) 05/17/2018 1101   MCH 30.0 11/13/2009 0401   MCHC 31.2 (L) 05/17/2018 1101   MCHC 33.8 11/13/2009 0401   RDW 16.2 (H) 05/17/2018 1101   LYMPHSABS 1.7 05/17/2018 1101   MONOABS 1.1 (H) 11/13/2009 0401   EOSABS 0.1 05/17/2018 1101   BASOSABS 0.0 05/17/2018 1101   Iron/TIBC/Ferritin/ %Sat    Component Value Date/Time   IRON  41 04/27/2018 1104   TIBC 350 04/27/2018 1104   FERRITIN 8 (L) 04/27/2018 1104   IRONPCTSAT 12 (L) 04/27/2018 1104   Lipid Panel     Component Value Date/Time   CHOL 176 04/24/2018 1020   TRIG 234 (H) 04/24/2018 1020   HDL 25 (L) 04/24/2018 1020   CHOLHDL 7.0 (H) 04/24/2018 1020   LDLCALC 104 (H) 04/24/2018 1020   Hepatic Function Panel     Component Value Date/Time   PROT 6.8 05/17/2018 1101   ALBUMIN 4.2 05/17/2018 1101   AST 12 05/17/2018 1101   ALT 13 05/17/2018 1101   ALKPHOS 71 05/17/2018 1101   BILITOT 0.3 05/17/2018 1101      Component Value Date/Time   TSH 2.420 05/17/2018 1101   TSH 2.670 04/24/2018 1020   Results for HEAVENLY, CHRISTINE (MRN [id number]) as of 08/03/2018 15:50  Ref. Range 07/20/2018 14:01  Vitamin D, 25-Hydroxy Latest Ref Range: 30.0 - 100.0 ng/mL 52.8     I, Marcille Blanco, CMA, am acting as transcriptionist for Starlyn Skeans, MD I have reviewed the above documentation for accuracy and completeness, and I agree with the above. -Dennard Nip, MD

## 2018-08-03 NOTE — Telephone Encounter (Signed)
Patient sent the following correspondence through [place]. Routing to triage to assist patient with request.  Hi Dr. Sabra Heck,    I have a lab appointment scheduled on June 12 in your office. Do you want me to keep this appointment or do labs with Dr. Leafy Ro? I have a tele-appointment with Dr. Leafy Ro today (6/4). I can ask about labs.    Thanks, Alexis Jones

## 2018-08-11 ENCOUNTER — Other Ambulatory Visit: Payer: BC Managed Care – PPO

## 2018-08-15 DIAGNOSIS — E66813 Obesity, class 3: Secondary | ICD-10-CM | POA: Insufficient documentation

## 2018-08-17 ENCOUNTER — Encounter (INDEPENDENT_AMBULATORY_CARE_PROVIDER_SITE_OTHER): Payer: Self-pay | Admitting: Family Medicine

## 2018-08-17 ENCOUNTER — Ambulatory Visit (INDEPENDENT_AMBULATORY_CARE_PROVIDER_SITE_OTHER): Payer: BC Managed Care – PPO | Admitting: Family Medicine

## 2018-08-17 ENCOUNTER — Other Ambulatory Visit: Payer: Self-pay

## 2018-08-17 DIAGNOSIS — Z6841 Body Mass Index (BMI) 40.0 and over, adult: Secondary | ICD-10-CM | POA: Diagnosis not present

## 2018-08-17 DIAGNOSIS — R7303 Prediabetes: Secondary | ICD-10-CM | POA: Diagnosis not present

## 2018-08-21 NOTE — Progress Notes (Signed)
Office: [phone]  /  Fax: [phone] TeleHealth Visit:  Alexis Jones has verbally consented to this TeleHealth visit today. The patient is located at home, the provider is located at the [employer] [facility] office. The participants in this visit include the listed provider and patient. The visit was conducted today via doxy.me.  HPI:   Chief Complaint: OBESITY Alexis Jones is here to discuss her progress with her obesity treatment plan. She is on the keep a food journal with 1300-1600 calories and 85+ grams of protein daily and is following her eating plan approximately 50 % of the time. She states she is exercising 0 minutes 0 times per week. Alexis Jones has done well maintaining her weight over the last 2 weeks. She has traveled and done some celebration eating, but has worked to get back on track.  We were unable to weigh the patient today for this TeleHealth visit. She feels as if she has maintained her weight since her last visit. She has lost 0 lbs since starting treatment with [name].  Pre-Diabetes Alexis Jones has a diagnosis of pre-diabetes based on her last elevated Hgb A1c 5.8 and was informed this puts her at greater risk of developing diabetes. She is not taking metformin currently and continues to work on diet and exercise to decrease risk of diabetes.   ASSESSMENT AND PLAN:  Prediabetes  Class 3 severe obesity with serious comorbidity and body mass index (BMI) of 40.0 to 44.9 in adult, unspecified obesity type [facility])  PLAN:  Pre-Diabetes Alexis Jones will continue to work on weight loss, diet, exercise, and decreasing simple carbohydrates in her diet to help decrease the risk of diabetes. We dicussed metformin including benefits and risks. She was informed that eating too many simple carbohydrates or too many calories at one sitting increases the likelihood of GI side effects. Kurstyn declined metformin for now and a prescription was not written today. We will  recheck labs at her next in office visit. Alexis Jones agrees to follow up with our clinic in 2 weeks as directed to monitor her progress.  I spent > than 50% of the 15 minute visit on counseling as documented in the note.  Obesity Alexis Jones is currently in the action stage of change. As such, her goal is to continue with weight loss efforts She has agreed to keep a food journal with 1300-1600 calories and 85+ grams of protein daily or follow the Category 3 plan Alexis Jones has been instructed to work up to a goal of 150 minutes of combined cardio and strengthening exercise per week for weight loss and overall health benefits. We discussed the following Behavioral Modification Strategies today: keeping healthy foods in the home, holiday eating strategies, and celebration eating strategies   Alexis Jones has agreed to follow up with our clinic in 2 weeks. She was informed of the importance of frequent follow up visits to maximize her success with intensive lifestyle modifications for her multiple health conditions.  ALLERGIES: No Known Allergies  MEDICATIONS: Current Outpatient Medications on File Prior to Visit  Medication Sig Dispense Refill   escitalopram (LEXAPRO) 10 MG tablet Take 1 tablet (10 mg total) by mouth daily. 90 tablet 4   ibuprofen (ADVIL,MOTRIN) 200 MG tablet Take 200 mg by mouth every 8 (eight) hours as needed.     levonorgestrel (MIRENA) 20 MCG/24HR IUD 1 each by Intrauterine route once. Placed 05/05/15     Multiple Vitamins-Minerals (WOMENS MULTI VITAMIN & MINERAL PO) Take 1 tablet by mouth daily.  omeprazole (PRILOSEC) 40 MG capsule Take 1 capsule (40 mg total) by mouth daily. 90 capsule 3   spironolactone (ALDACTONE) 100 MG tablet Take 1 tablet by mouth daily.     Vitamin D, Ergocalciferol, (DRISDOL) 1.25 MG (50000 UT) CAPS capsule Take 1 capsule (50,000 Units total) by mouth every 7 (seven) days. 8 capsule 0   No current facility-administered medications on file  prior to visit.     PAST MEDICAL HISTORY: Past Medical History:  Diagnosis Date   Anxiety    Asthma    Dyspnea    Female bladder prolapse    GERD (gastroesophageal reflux disease)    Hiatal hernia    High triglycerides    HLD (hyperlipidemia)    Left ovarian cyst    Low HDL (under 40)    Lower extremity edema    Microcytic anemia    PCOS (polycystic ovarian syndrome)    Prediabetes    Rectocele    Urinary incontinence    Vitamin D deficiency     PAST SURGICAL HISTORY: Past Surgical History:  Procedure Laterality Date   BREAST REDUCTION SURGERY  2000   LIPOMA EXCISION  2015   left shoulder lipoma removed (2)   TEAR DUCT PROBING  04/1979    SOCIAL HISTORY: Social History   Tobacco Use   Smoking status: Never Smoker   Smokeless tobacco: Never Used  Substance Use Topics   Alcohol use: No   Drug use: No    FAMILY HISTORY: Family History  Problem Relation Age of Onset   Hypertension Mother    Hyperlipidemia Mother    Thyroid disease Mother    Anxiety disorder Mother    Obesity Mother    Cancer Father        prostate cancer   Mitral valve prolapse Father    Obesity Father    Breast cancer Maternal Aunt    Ovarian cancer Maternal Grandmother    Skin cancer Maternal Grandfather    Lung cancer Maternal Grandfather     ROS: Review of Systems  Constitutional: Negative for weight loss.    PHYSICAL EXAM: Pt in no acute distress  RECENT LABS AND TESTS: BMET    Component Value Date/Time   NA 136 05/17/2018 1101   K 4.3 05/17/2018 1101   CL 99 05/17/2018 1101   CO2 24 05/17/2018 1101   GLUCOSE 86 05/17/2018 1101   GLUCOSE 94 11/13/2009 0401   BUN 7 05/17/2018 1101   CREATININE 0.85 05/17/2018 1101   CALCIUM 9.2 05/17/2018 1101   GFRNONAA 86 05/17/2018 1101   GFRAA 99 05/17/2018 1101   Lab Results  Component Value Date   HGBA1C 5.8 (H) 05/17/2018   HGBA1C 5.9 (H) 04/24/2018   Lab Results  Component Value  Date   INSULIN 13.0 05/17/2018   CBC    Component Value Date/Time   WBC 6.6 05/17/2018 1101   WBC 10.2 11/13/2009 0401   RBC 4.58 05/17/2018 1101   RBC 4.47 11/13/2009 0401   HGB 11.0 (L) 05/17/2018 1101   HCT 35.3 05/17/2018 1101   PLT 307 04/24/2018 1020   MCV 77 (L) 05/17/2018 1101   MCH 24.0 (L) 05/17/2018 1101   MCH 30.0 11/13/2009 0401   MCHC 31.2 (L) 05/17/2018 1101   MCHC 33.8 11/13/2009 0401   RDW 16.2 (H) 05/17/2018 1101   LYMPHSABS 1.7 05/17/2018 1101   MONOABS 1.1 (H) 11/13/2009 0401   EOSABS 0.1 05/17/2018 1101   BASOSABS 0.0 05/17/2018 1101   Iron/TIBC/Ferritin/ %Sat  Component Value Date/Time   IRON 41 04/27/2018 1104   TIBC 350 04/27/2018 1104   FERRITIN 8 (L) 04/27/2018 1104   IRONPCTSAT 12 (L) 04/27/2018 1104   Lipid Panel     Component Value Date/Time   CHOL 176 04/24/2018 1020   TRIG 234 (H) 04/24/2018 1020   HDL 25 (L) 04/24/2018 1020   CHOLHDL 7.0 (H) 04/24/2018 1020   LDLCALC 104 (H) 04/24/2018 1020   Hepatic Function Panel     Component Value Date/Time   PROT 6.8 05/17/2018 1101   ALBUMIN 4.2 05/17/2018 1101   AST 12 05/17/2018 1101   ALT 13 05/17/2018 1101   ALKPHOS 71 05/17/2018 1101   BILITOT 0.3 05/17/2018 1101      Component Value Date/Time   TSH 2.420 05/17/2018 1101   TSH 2.670 04/24/2018 1020      I, Trixie Dredge, am acting as Location manager for Dennard Nip, MD I have reviewed the above documentation for accuracy and completeness, and I agree with the above. -Dennard Nip, MD

## 2018-08-31 ENCOUNTER — Telehealth (INDEPENDENT_AMBULATORY_CARE_PROVIDER_SITE_OTHER): Payer: BC Managed Care – PPO | Admitting: Bariatrics

## 2018-08-31 ENCOUNTER — Encounter (INDEPENDENT_AMBULATORY_CARE_PROVIDER_SITE_OTHER): Payer: Self-pay | Admitting: Bariatrics

## 2018-08-31 ENCOUNTER — Other Ambulatory Visit: Payer: Self-pay

## 2018-08-31 DIAGNOSIS — E559 Vitamin D deficiency, unspecified: Secondary | ICD-10-CM

## 2018-08-31 DIAGNOSIS — R7303 Prediabetes: Secondary | ICD-10-CM | POA: Diagnosis not present

## 2018-08-31 DIAGNOSIS — Z6841 Body Mass Index (BMI) 40.0 and over, adult: Secondary | ICD-10-CM

## 2018-09-05 NOTE — Progress Notes (Signed)
Office: [phone]  /  Fax: [phone] TeleHealth Visit:  Alexis Jones has verbally consented to this TeleHealth visit today. The patient is located at home, the provider is located at the [employer] [facility] office. The participants in this visit include the listed provider and patient and any and all parties involved. The visit was conducted today via telephone. [name] was unable to use realtime audiovisual technology today and the telehealth visit was conducted via telephone.  HPI:   Chief Complaint: OBESITY Alexis Jones is here to discuss her progress with her obesity treatment plan. She is on the Category 3 plan and is following her eating plan approximately 80 % of the time. She states she is exercising 0 minutes 0 times per week. Alexis Jones states that she is down 2 1/2 pounds (weight 242 lbs). She normally sees Dr. Leafy Ro. We were unable to weigh the patient today for this TeleHealth visit. She feels as if she has lost weight since her last visit. She has lost 7 lbs since starting treatment with Alexis Jones.  Pre-Diabetes Alexis Jones has a diagnosis of prediabetes based on her elevated Hgb A1c and was informed this puts her at greater risk of developing diabetes. She is not on medications currently and continues to work on diet and exercise to decrease risk of diabetes. She denies nausea or hypoglycemia.  Vitamin D deficiency Alexis Jones has a diagnosis of vitamin D deficiency. She is currently taking vit D and denies nausea, vomiting or muscle weakness.  ASSESSMENT AND PLAN:  Prediabetes  Vitamin D deficiency  Class 3 severe obesity with serious comorbidity and body mass index (BMI) of 40.0 to 44.9 in adult, unspecified obesity type ([place])  PLAN:  Pre-Diabetes Alexis Jones will continue to work on weight loss, increasing activity, increasing lean protein and decreasing simple carbohydrates in her diet to help decrease the risk of diabetes. She was informed that eating  too many simple carbohydrates or too many calories at one sitting increases the likelihood of GI side effects. Alexis Jones agreed to follow up with Alexis Jones as directed to monitor her progress.  Vitamin D Deficiency Alexis Jones was informed that low vitamin D levels contributes to fatigue and are associated with obesity, breast, and colon cancer. She will continue to take prescription Vit D @50 ,000 IU every week and will follow up for routine testing of vitamin D, at least 2-3 times per year. She was informed of the risk of over-replacement of vitamin D and agrees to not increase her dose unless she discusses this with Alexis Jones first.  Obesity Alexis Jones is currently in the action stage of change. As such, her goal is to continue with weight loss efforts She has agreed to keep a food journal with 1500 to 1600 calories and 85 to 90 grams of protein and follow the Category 3 plan (MyFitnessPal) Alexis Jones will try to walk more for weight loss and overall health benefits. We discussed the following Behavioral Modification Strategies today: planning for success, increase H2O intake, no skipping meals, keeping healthy foods in the home, increasing lean protein intake, decreasing simple carbohydrates, increasing vegetables, decrease eating out and work on meal planning and intentional eating Alexis Jones will weigh herself at home until she returns to the office  Alexis Jones has agreed to follow up with our clinic in 2 weeks. She was informed of the importance of frequent follow up visits to maximize her success with intensive lifestyle modifications for her multiple health conditions.  ALLERGIES: No Known Allergies  MEDICATIONS: Current Outpatient Medications on File Prior to  Visit  Medication Sig Dispense Refill  . escitalopram (LEXAPRO) 10 MG tablet Take 1 tablet (10 mg total) by mouth daily. 90 tablet 4  . ibuprofen (ADVIL,MOTRIN) 200 MG tablet Take 200 mg by mouth every 8 (eight) hours as needed.    [name] levonorgestrel  (MIRENA) 20 MCG/24HR IUD 1 each by Intrauterine route once. Placed 05/05/15    . Multiple Vitamins-Minerals (WOMENS MULTI VITAMIN & MINERAL PO) Take 1 tablet by mouth daily.    [name] omeprazole (PRILOSEC) 40 MG capsule Take 1 capsule (40 mg total) by mouth daily. 90 capsule 3  . spironolactone (ALDACTONE) 100 MG tablet Take 1 tablet by mouth daily.    . Vitamin D, Ergocalciferol, (DRISDOL) 1.25 MG (50000 UT) CAPS capsule Take 1 capsule (50,000 Units total) by mouth every 7 (seven) days. 8 capsule 0   No current facility-administered medications on file prior to visit.     PAST MEDICAL HISTORY: Past Medical History:  Diagnosis Date  . Anxiety   . Asthma   . Dyspnea   . Female bladder prolapse   . GERD (gastroesophageal reflux disease)   . Hiatal hernia   . High triglycerides   . HLD (hyperlipidemia)   . Left ovarian cyst   . Low HDL (under 40)   . Lower extremity edema   . Microcytic anemia   . PCOS (polycystic ovarian syndrome)   . Prediabetes   . Rectocele   . Urinary incontinence   . Vitamin D deficiency     PAST SURGICAL HISTORY: Past Surgical History:  Procedure Laterality Date  . BREAST REDUCTION SURGERY  2000  . LIPOMA EXCISION  2015   left shoulder lipoma removed (2)  . TEAR DUCT PROBING  04/1979    SOCIAL HISTORY: Social History   Tobacco Use  . Smoking status: Never Smoker  . Smokeless tobacco: Never Used  Substance Use Topics  . Alcohol use: No  . Drug use: No    FAMILY HISTORY: Family History  Problem Relation Age of Onset  . Hypertension Mother   . Hyperlipidemia Mother   . Thyroid disease Mother   . Anxiety disorder Mother   . Obesity Mother   . Cancer Father        prostate cancer  . Mitral valve prolapse Father   . Obesity Father   . Breast cancer Maternal Aunt   . Ovarian cancer Maternal Grandmother   . Skin cancer Maternal Grandfather   . Lung cancer Maternal Grandfather     ROS: Review of Systems  Constitutional: Positive for weight  loss.  Gastrointestinal: Negative for nausea and vomiting.  Musculoskeletal:       Negative for muscle weakness  Endo/Heme/Allergies:       Negative for hypoglycemia    PHYSICAL EXAM: Pt in no acute distress  RECENT LABS AND TESTS: BMET    Component Value Date/Time   NA 136 05/17/2018 1101   K 4.3 05/17/2018 1101   CL 99 05/17/2018 1101   CO2 24 05/17/2018 1101   GLUCOSE 86 05/17/2018 1101   GLUCOSE 94 11/13/2009 0401   BUN 7 05/17/2018 1101   CREATININE 0.85 05/17/2018 1101   CALCIUM 9.2 05/17/2018 1101   GFRNONAA 86 05/17/2018 1101   GFRAA 99 05/17/2018 1101   Lab Results  Component Value Date   HGBA1C 5.8 (H) 05/17/2018   HGBA1C 5.9 (H) 04/24/2018   Lab Results  Component Value Date   INSULIN 13.0 05/17/2018   CBC    Component Value Date/Time  WBC 6.6 05/17/2018 1101   WBC 10.2 11/13/2009 0401   RBC 4.58 05/17/2018 1101   RBC 4.47 11/13/2009 0401   HGB 11.0 (L) 05/17/2018 1101   HCT 35.3 05/17/2018 1101   PLT 307 04/24/2018 1020   MCV 77 (L) 05/17/2018 1101   MCH 24.0 (L) 05/17/2018 1101   MCH 30.0 11/13/2009 0401   MCHC 31.2 (L) 05/17/2018 1101   MCHC 33.8 11/13/2009 0401   RDW 16.2 (H) 05/17/2018 1101   LYMPHSABS 1.7 05/17/2018 1101   MONOABS 1.1 (H) 11/13/2009 0401   EOSABS 0.1 05/17/2018 1101   BASOSABS 0.0 05/17/2018 1101   Iron/TIBC/Ferritin/ %Sat    Component Value Date/Time   IRON 41 04/27/2018 1104   TIBC 350 04/27/2018 1104   FERRITIN 8 (L) 04/27/2018 1104   IRONPCTSAT 12 (L) 04/27/2018 1104   Lipid Panel     Component Value Date/Time   CHOL 176 04/24/2018 1020   TRIG 234 (H) 04/24/2018 1020   HDL 25 (L) 04/24/2018 1020   CHOLHDL 7.0 (H) 04/24/2018 1020   LDLCALC 104 (H) 04/24/2018 1020   Hepatic Function Panel     Component Value Date/Time   PROT 6.8 05/17/2018 1101   ALBUMIN 4.2 05/17/2018 1101   AST 12 05/17/2018 1101   ALT 13 05/17/2018 1101   ALKPHOS 71 05/17/2018 1101   BILITOT 0.3 05/17/2018 1101      Component  Value Date/Time   TSH 2.420 05/17/2018 1101   TSH 2.670 04/24/2018 1020     Ref. Range 07/20/2018 14:01  Vitamin D, 25-Hydroxy Latest Ref Range: 30.0 - 100.0 ng/mL 52.8    I, Doreene Nest, am acting as Location manager for [employer]. Owens Shark, DO  I have reviewed the above documentation for accuracy and completeness, and I agree with the above. -Jearld Lesch, DO

## 2018-09-14 ENCOUNTER — Encounter (INDEPENDENT_AMBULATORY_CARE_PROVIDER_SITE_OTHER): Payer: Self-pay | Admitting: Family Medicine

## 2018-09-14 ENCOUNTER — Telehealth (INDEPENDENT_AMBULATORY_CARE_PROVIDER_SITE_OTHER): Payer: BC Managed Care – PPO | Admitting: Family Medicine

## 2018-09-14 ENCOUNTER — Other Ambulatory Visit: Payer: Self-pay

## 2018-09-14 DIAGNOSIS — R7303 Prediabetes: Secondary | ICD-10-CM | POA: Diagnosis not present

## 2018-09-14 DIAGNOSIS — F418 Other specified anxiety disorders: Secondary | ICD-10-CM

## 2018-09-14 DIAGNOSIS — Z6841 Body Mass Index (BMI) 40.0 and over, adult: Secondary | ICD-10-CM

## 2018-09-14 MED ORDER — BUPROPION HCL ER (SR) 150 MG PO TB12: 150.0000 mg | ORAL_TABLET | Freq: Every day | ORAL | 0 refills | Status: DC

## 2018-09-18 NOTE — Progress Notes (Signed)
Office: [phone]  /  Fax: [phone] TeleHealth Visit:  Alexis Jones has verbally consented to this TeleHealth visit today. The patient is located at home, the provider is located at the [employer] [facility] office. The participants in this visit include the listed provider and patient. The visit was conducted today via [email or web address].  HPI:   Chief Complaint: OBESITY Alexis Jones is here to discuss her progress with her obesity treatment plan. She is on the Category 3 plan and is following her eating plan approximately 70% of the time. She states she is doing yard work. 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 She notes her motivation isn't as strong and she is struggling with some meal planning but doing better than when she started. We were unable to weigh the patient today for this TeleHealth visit. She feels as if she has maintained her weight since her last visit. She has lost 0 lbs since starting treatment with [name].  Depression with Anxiety [name] is struggling with increased emotional eating, worse in the p.m., worse since COVID-19 and multiple changes at work, and has been using food for comfort more often. She often snacks when she is not hungry. Terren sometimes feels she is out of control and then feels guilty that she made poor food choices. She has been working on behavior modification techniques to help reduce her emotional eating and has been somewhat successful. She shows no sign of suicidal or homicidal ideations.  Depression screen PHQ 2/9 05/17/2018  Decreased Interest 2  Down, Depressed, Hopeless 1  PHQ - 2 Score 3  Altered sleeping 1  Tired, decreased energy 2  Change in appetite 1  Feeling bad or failure about yourself  0  Trouble concentrating 0  Moving slowly or fidgety/restless 0  Suicidal thoughts 0  PHQ-9 Score 7  Difficult doing work/chores Not  difficult at all   Pre-Diabetes [name] has a diagnosis of prediabetes based on her elevated Hgb A1c and was informed this puts her at greater risk of developing diabetes. She is stable with her diet and reports decreased polyphagia when she follows her diet prescription. She is not taking metformin currently and continues to work on diet and exercise to decrease risk of diabetes.   ASSESSMENT AND PLAN:  Prediabetes  Depression with anxiety  Class 3 severe obesity with serious comorbidity and body mass index (BMI) of 40.0 to 44.9 in adult, unspecified obesity type ([place])  PLAN:  Depression with Anxiety We discussed behavior modification techniques today to help Alexis Jones deal with her emotional eating and depression. Alexis Jones will continue Lexapro and will start Wellbutrin 150 mg #30 with 0 refills and agrees to follow-up with our clinic in 2 weeks.  Pre-Diabetes Alexis Jones will continue to work on weight loss, exercise, and decreasing simple carbohydrates in her diet to help decrease the risk of diabetes. We dicussed metformin including benefits and risks. She was informed that eating too many simple carbohydrates or too many calories at one sitting increases the likelihood of GI side effects. Alexis Jones will continue diet and exercise. We will recheck labs at her next in-office visit. She will follow-up with [name] as directed to monitor her progress.  I spent > than 50% of the 25 minute visit on counseling as documented in the note.  Obesity [name] is currently in the action stage of change. As such, her goal is to continue with weight loss efforts. She  has agreed to follow the Category 3 plan. [name] has been instructed to work up to a goal of 150 minutes of combined cardio and strengthening exercise per week for weight loss and overall health benefits. We discussed the following Behavioral Modification Strategies today: keeping healthy foods in the home and emotional eating  strategies.  Alexis Jones has agreed to follow-up with our clinic in 2 weeks. She was informed of the importance of frequent follow-up visits to maximize her success with intensive lifestyle modifications for her multiple health conditions.  ALLERGIES: No Known Allergies  MEDICATIONS: Current Outpatient Medications on File Prior to Visit  Medication Sig Dispense Refill  . escitalopram (LEXAPRO) 10 MG tablet Take 1 tablet (10 mg total) by mouth daily. 90 tablet 4  . ibuprofen (ADVIL,MOTRIN) 200 MG tablet Take 200 mg by mouth every 8 (eight) hours as needed.    [name] levonorgestrel (MIRENA) 20 MCG/24HR IUD 1 each by Intrauterine route once. Placed 05/05/15    . Multiple Vitamins-Minerals (WOMENS MULTI VITAMIN & MINERAL PO) Take 1 tablet by mouth daily.    [name] omeprazole (PRILOSEC) 40 MG capsule Take 1 capsule (40 mg total) by mouth daily. 90 capsule 3  . spironolactone (ALDACTONE) 100 MG tablet Take 1 tablet by mouth daily.    . Vitamin D, Ergocalciferol, (DRISDOL) 1.25 MG (50000 UT) CAPS capsule Take 1 capsule (50,000 Units total) by mouth every 7 (seven) days. 8 capsule 0   No current facility-administered medications on file prior to visit.     PAST MEDICAL HISTORY: Past Medical History:  Diagnosis Date  . Anxiety   . Asthma   . Dyspnea   . Female bladder prolapse   . GERD (gastroesophageal reflux disease)   . Hiatal hernia   . High triglycerides   . HLD (hyperlipidemia)   . Left ovarian cyst   . Low HDL (under 40)   . Lower extremity edema   . Microcytic anemia   . PCOS (polycystic ovarian syndrome)   . Prediabetes   . Rectocele   . Urinary incontinence   . Vitamin D deficiency     PAST SURGICAL HISTORY: Past Surgical History:  Procedure Laterality Date  . BREAST REDUCTION SURGERY  2000  . LIPOMA EXCISION  2015   left shoulder lipoma removed (2)  . TEAR DUCT PROBING  04/1979    SOCIAL HISTORY: Social History   Tobacco Use  . Smoking status: Never Smoker  . Smokeless  tobacco: Never Used  Substance Use Topics  . Alcohol use: No  . Drug use: No    FAMILY HISTORY: Family History  Problem Relation Age of Onset  . Hypertension Mother   . Hyperlipidemia Mother   . Thyroid disease Mother   . Anxiety disorder Mother   . Obesity Mother   . Cancer Father        prostate cancer  . Mitral valve prolapse Father   . Obesity Father   . Breast cancer Maternal Aunt   . Ovarian cancer Maternal Grandmother   . Skin cancer Maternal Grandfather   . Lung cancer Maternal Grandfather    ROS: Review of Systems  Psychiatric/Behavioral: Positive for depression. Negative for suicidal ideas. The patient is nervous/anxious.        Negative for homicidal ideas.   PHYSICAL EXAM: Pt in no acute distress  RECENT LABS AND TESTS: BMET    Component Value Date/Time   NA 136 05/17/2018 1101   K 4.3 05/17/2018 1101   CL 99 05/17/2018 1101  CO2 24 05/17/2018 1101   GLUCOSE 86 05/17/2018 1101   GLUCOSE 94 11/13/2009 0401   BUN 7 05/17/2018 1101   CREATININE 0.85 05/17/2018 1101   CALCIUM 9.2 05/17/2018 1101   GFRNONAA 86 05/17/2018 1101   GFRAA 99 05/17/2018 1101   Lab Results  Component Value Date   HGBA1C 5.8 (H) 05/17/2018   HGBA1C 5.9 (H) 04/24/2018   Lab Results  Component Value Date   INSULIN 13.0 05/17/2018   CBC    Component Value Date/Time   WBC 6.6 05/17/2018 1101   WBC 10.2 11/13/2009 0401   RBC 4.58 05/17/2018 1101   RBC 4.47 11/13/2009 0401   HGB 11.0 (L) 05/17/2018 1101   HCT 35.3 05/17/2018 1101   PLT 307 04/24/2018 1020   MCV 77 (L) 05/17/2018 1101   MCH 24.0 (L) 05/17/2018 1101   MCH 30.0 11/13/2009 0401   MCHC 31.2 (L) 05/17/2018 1101   MCHC 33.8 11/13/2009 0401   RDW 16.2 (H) 05/17/2018 1101   LYMPHSABS 1.7 05/17/2018 1101   MONOABS 1.1 (H) 11/13/2009 0401   EOSABS 0.1 05/17/2018 1101   BASOSABS 0.0 05/17/2018 1101   Iron/TIBC/Ferritin/ %Sat    Component Value Date/Time   IRON 41 04/27/2018 1104   TIBC 350 04/27/2018  1104   FERRITIN 8 (L) 04/27/2018 1104   IRONPCTSAT 12 (L) 04/27/2018 1104   Lipid Panel     Component Value Date/Time   CHOL 176 04/24/2018 1020   TRIG 234 (H) 04/24/2018 1020   HDL 25 (L) 04/24/2018 1020   CHOLHDL 7.0 (H) 04/24/2018 1020   LDLCALC 104 (H) 04/24/2018 1020   Hepatic Function Panel     Component Value Date/Time   PROT 6.8 05/17/2018 1101   ALBUMIN 4.2 05/17/2018 1101   AST 12 05/17/2018 1101   ALT 13 05/17/2018 1101   ALKPHOS 71 05/17/2018 1101   BILITOT 0.3 05/17/2018 1101      Component Value Date/Time   TSH 2.420 05/17/2018 1101   TSH 2.670 04/24/2018 1020   Results for ALLYCIA, PITZ (MRN [id number]) as of 09/18/2018 13:09  Ref. Range 07/20/2018 14:01  Vitamin D, 25-Hydroxy Latest Ref Range: 30.0 - 100.0 ng/mL 52.8   I, Michaelene Song, am acting as Location manager for Dennard Nip, MD I have reviewed the above documentation for accuracy and completeness, and I agree with the above. -Dennard Nip, MD

## 2018-09-27 ENCOUNTER — Other Ambulatory Visit: Payer: Self-pay | Admitting: Obstetrics & Gynecology

## 2018-09-28 ENCOUNTER — Other Ambulatory Visit: Payer: Self-pay

## 2018-09-28 ENCOUNTER — Encounter (INDEPENDENT_AMBULATORY_CARE_PROVIDER_SITE_OTHER): Payer: Self-pay | Admitting: Family Medicine

## 2018-09-28 ENCOUNTER — Telehealth (INDEPENDENT_AMBULATORY_CARE_PROVIDER_SITE_OTHER): Payer: BC Managed Care – PPO | Admitting: Family Medicine

## 2018-09-28 DIAGNOSIS — E559 Vitamin D deficiency, unspecified: Secondary | ICD-10-CM

## 2018-09-28 DIAGNOSIS — F418 Other specified anxiety disorders: Secondary | ICD-10-CM | POA: Diagnosis not present

## 2018-09-28 DIAGNOSIS — Z6841 Body Mass Index (BMI) 40.0 and over, adult: Secondary | ICD-10-CM

## 2018-09-28 MED ORDER — VITAMIN D (ERGOCALCIFEROL) 1.25 MG (50000 UNIT) PO CAPS: 50000.0000 [IU] | ORAL_CAPSULE | ORAL | 0 refills | Status: DC

## 2018-10-02 NOTE — Progress Notes (Signed)
Office: [phone]  /  Fax: [phone] TeleHealth Visit:  Alexis Jones has verbally consented to this TeleHealth visit today. The patient is located at home, the provider is located at the [facility] office. The participants in this visit include the listed provider and patient. The visit was conducted today via [email or web address].  HPI:   Chief Complaint: OBESITY Alexis Jones is here to discuss her progress with her obesity treatment plan. She is on the Category 3 plan and is following her eating plan approximately 80 % of the time. She states she is riding her stationary bike for 15-20 minutes 1-2 times per week. Alexis Jones states she is doing well on her Category 3 plan, and she thinks she has lost 1-2 lbs since her last visit. She feels more in control of her emotional eating over the last 2 weeks, and she is sleeping better.  We were unable to weigh the patient today for this TeleHealth visit. She feels as if she has lost 1-2 lbs since her last visit. She has lost 0 lbs since starting treatment with [name].  Vitamin D Deficiency Alexis Jones has a diagnosis of vitamin D deficiency. Her last Vit D level was at goal on prescription Vit D. She denies nausea, vomiting or muscle weakness, and notes decrease in fatigue.  Depression with Emotional Eating Behaviors Alexis Jones started Wellbutrin last visit in addition to her Lexapro. She states her mood has improved and her fatigue has improved. She denies insomnia or palpitations. Alexis Jones struggles with emotional eating and using food for comfort to the extent that it is negatively impacting her health. She often snacks when she is not hungry. Alexis Jones sometimes feels she is out of control and then feels guilty that she made poor food choices. She has been working on behavior modification techniques to help reduce her emotional eating and has been somewhat successful. She shows no sign of suicidal or homicidal ideations.  Depression  screen PHQ 2/9 05/17/2018  Decreased Interest 2  Down, Depressed, Hopeless 1  PHQ - 2 Score 3  Altered sleeping 1  Tired, decreased energy 2  Change in appetite 1  Feeling bad or failure about yourself  0  Trouble concentrating 0  Moving slowly or fidgety/restless 0  Suicidal thoughts 0  PHQ-9 Score 7  Difficult doing work/chores Not difficult at all    ASSESSMENT AND PLAN:  Vitamin D deficiency  Depression with anxiety  Class 3 severe obesity with serious comorbidity and body mass index (BMI) of 40.0 to 44.9 in adult, unspecified obesity type ([place])  PLAN:  Vitamin D Deficiency Arilla was informed that low vitamin D levels contributes to fatigue and are associated with obesity, breast, and colon cancer. Kimble agrees to continue taking prescription Vit D 50,000 IU every week #4 and we will refill for 1 month. She will follow up for routine testing of vitamin D, at least 2-3 times per year. She was informed of the risk of over-replacement of vitamin D and agrees to not increase her dose unless she discusses this with [name] first. Keoni agrees to follow up with our clinic in 2 weeks.  Depression with Emotional Eating Behaviors We discussed behavior modification techniques today to help Alexis Jones deal with her emotional eating and depression. Arnell agrees to continue taking Wellbutrin and Lexapro, and she agrees to follow up with our clinic in 2 weeks.  Obesity Alexis Jones is currently in the action stage of change. As such, her goal is to continue with weight loss efforts She  has agreed to keep a food journal with 1600 calories and 85 grams of protein daily or follow the Category 3 plan Alexis Jones has been instructed to work up to a goal of 150 minutes of combined cardio and strengthening exercise per week for weight loss and overall health benefits. We discussed the following Behavioral Modification Strategies today: work on meal planning and easy cooking plans, emotional  eating strategies, an keep a strict food journal   Alexis Jones has agreed to follow up with our clinic in 2 weeks. She was informed of the importance of frequent follow up visits to maximize her success with intensive lifestyle modifications for her multiple health conditions.  ALLERGIES: No Known Allergies  MEDICATIONS: Current Outpatient Medications on File Prior to Visit  Medication Sig Dispense Refill  . buPROPion (WELLBUTRIN SR) 150 MG 12 hr tablet Take 1 tablet (150 mg total) by mouth daily. 30 tablet 0  . escitalopram (LEXAPRO) 10 MG tablet Take 1 tablet (10 mg total) by mouth daily. 90 tablet 4  . ibuprofen (ADVIL,MOTRIN) 200 MG tablet Take 200 mg by mouth every 8 (eight) hours as needed.    [name] levonorgestrel (MIRENA) 20 MCG/24HR IUD 1 each by Intrauterine route once. Placed 05/05/15    . Multiple Vitamins-Minerals (WOMENS MULTI VITAMIN & MINERAL PO) Take 1 tablet by mouth daily.    [name] omeprazole (PRILOSEC) 40 MG capsule Take 1 capsule (40 mg total) by mouth daily. 90 capsule 3  . spironolactone (ALDACTONE) 100 MG tablet Take 1 tablet by mouth daily.     No current facility-administered medications on file prior to visit.     PAST MEDICAL HISTORY: Past Medical History:  Diagnosis Date  . Anxiety   . Asthma   . Dyspnea   . Female bladder prolapse   . GERD (gastroesophageal reflux disease)   . Hiatal hernia   . High triglycerides   . HLD (hyperlipidemia)   . Left ovarian cyst   . Low HDL (under 40)   . Lower extremity edema   . Microcytic anemia   . PCOS (polycystic ovarian syndrome)   . Prediabetes   . Rectocele   . Urinary incontinence   . Vitamin D deficiency     PAST SURGICAL HISTORY: Past Surgical History:  Procedure Laterality Date  . BREAST REDUCTION SURGERY  2000  . LIPOMA EXCISION  2015   left shoulder lipoma removed (2)  . TEAR DUCT PROBING  04/1979    SOCIAL HISTORY: Social History   Tobacco Use  . Smoking status: Never Smoker  . Smokeless tobacco:  Never Used  Substance Use Topics  . Alcohol use: No  . Drug use: No    FAMILY HISTORY: Family History  Problem Relation Age of Onset  . Hypertension Mother   . Hyperlipidemia Mother   . Thyroid disease Mother   . Anxiety disorder Mother   . Obesity Mother   . Cancer Father        prostate cancer  . Mitral valve prolapse Father   . Obesity Father   . Breast cancer Maternal Aunt   . Ovarian cancer Maternal Grandmother   . Skin cancer Maternal Grandfather   . Lung cancer Maternal Grandfather     ROS: Review of Systems  Constitutional: Positive for malaise/fatigue and weight loss.  Cardiovascular: Negative for palpitations.  Gastrointestinal: Negative for nausea and vomiting.  Musculoskeletal:       Negative muscle weakness  Psychiatric/Behavioral: Positive for depression. Negative for suicidal ideas. The patient does not  have insomnia.     PHYSICAL EXAM: Pt in no acute distress  RECENT LABS AND TESTS: BMET    Component Value Date/Time   NA 136 05/17/2018 1101   K 4.3 05/17/2018 1101   CL 99 05/17/2018 1101   CO2 24 05/17/2018 1101   GLUCOSE 86 05/17/2018 1101   GLUCOSE 94 11/13/2009 0401   BUN 7 05/17/2018 1101   CREATININE 0.85 05/17/2018 1101   CALCIUM 9.2 05/17/2018 1101   GFRNONAA 86 05/17/2018 1101   GFRAA 99 05/17/2018 1101   Lab Results  Component Value Date   HGBA1C 5.8 (H) 05/17/2018   HGBA1C 5.9 (H) 04/24/2018   Lab Results  Component Value Date   INSULIN 13.0 05/17/2018   CBC    Component Value Date/Time   WBC 6.6 05/17/2018 1101   WBC 10.2 11/13/2009 0401   RBC 4.58 05/17/2018 1101   RBC 4.47 11/13/2009 0401   HGB 11.0 (L) 05/17/2018 1101   HCT 35.3 05/17/2018 1101   PLT 307 04/24/2018 1020   MCV 77 (L) 05/17/2018 1101   MCH 24.0 (L) 05/17/2018 1101   MCH 30.0 11/13/2009 0401   MCHC 31.2 (L) 05/17/2018 1101   MCHC 33.8 11/13/2009 0401   RDW 16.2 (H) 05/17/2018 1101   LYMPHSABS 1.7 05/17/2018 1101   MONOABS 1.1 (H) 11/13/2009 0401    EOSABS 0.1 05/17/2018 1101   BASOSABS 0.0 05/17/2018 1101   Iron/TIBC/Ferritin/ %Sat    Component Value Date/Time   IRON 41 04/27/2018 1104   TIBC 350 04/27/2018 1104   FERRITIN 8 (L) 04/27/2018 1104   IRONPCTSAT 12 (L) 04/27/2018 1104   Lipid Panel     Component Value Date/Time   CHOL 176 04/24/2018 1020   TRIG 234 (H) 04/24/2018 1020   HDL 25 (L) 04/24/2018 1020   CHOLHDL 7.0 (H) 04/24/2018 1020   LDLCALC 104 (H) 04/24/2018 1020   Hepatic Function Panel     Component Value Date/Time   PROT 6.8 05/17/2018 1101   ALBUMIN 4.2 05/17/2018 1101   AST 12 05/17/2018 1101   ALT 13 05/17/2018 1101   ALKPHOS 71 05/17/2018 1101   BILITOT 0.3 05/17/2018 1101      Component Value Date/Time   TSH 2.420 05/17/2018 1101   TSH 2.670 04/24/2018 1020      I, Trixie Dredge, am acting as Location manager for Dennard Nip, MD I have reviewed the above documentation for accuracy and completeness, and I agree with the above. -Dennard Nip, MD

## 2018-10-06 ENCOUNTER — Other Ambulatory Visit (INDEPENDENT_AMBULATORY_CARE_PROVIDER_SITE_OTHER): Payer: Self-pay | Admitting: Family Medicine

## 2018-10-12 ENCOUNTER — Encounter (INDEPENDENT_AMBULATORY_CARE_PROVIDER_SITE_OTHER): Payer: Self-pay | Admitting: Family Medicine

## 2018-10-12 ENCOUNTER — Other Ambulatory Visit: Payer: Self-pay

## 2018-10-12 ENCOUNTER — Telehealth (INDEPENDENT_AMBULATORY_CARE_PROVIDER_SITE_OTHER): Payer: BC Managed Care – PPO | Admitting: Family Medicine

## 2018-10-12 DIAGNOSIS — F418 Other specified anxiety disorders: Secondary | ICD-10-CM | POA: Diagnosis not present

## 2018-10-12 DIAGNOSIS — Z6841 Body Mass Index (BMI) 40.0 and over, adult: Secondary | ICD-10-CM | POA: Diagnosis not present

## 2018-10-12 DIAGNOSIS — R7303 Prediabetes: Secondary | ICD-10-CM | POA: Diagnosis not present

## 2018-10-12 DIAGNOSIS — E66813 Obesity, class 3: Secondary | ICD-10-CM

## 2018-10-12 MED ORDER — BUPROPION HCL ER (SR) 200 MG PO TB12: 200.0000 mg | ORAL_TABLET | Freq: Every day | ORAL | 0 refills | Status: DC

## 2018-10-19 NOTE — Progress Notes (Signed)
Office: [phone]  /  Fax: [phone] TeleHealth Visit:  Alexis Jones has verbally consented to this TeleHealth visit today. The patient is located at home, the provider is located at the [facility] office. The participants in this visit include the listed provider and patient. The visit was conducted today via [email or web address].  HPI:   Chief Complaint: OBESITY Alexis Jones is here to discuss her progress with her obesity treatment plan. She is on the keep a food journal with 1600 calories and 85 grams of protein daily or follow the Category 3 plan and is following her eating plan approximately 80 % of the time. She states she is exercising 0 minutes 0 times per week. [name] continues to do well with weight loss and she feels she has lost another lb since her last visit. Her college is starting classes soon and her daughter is starting middle school next week, so home is a lot of stress and changes.  We were unable to weigh the patient today for this TeleHealth visit. She feels as if she has lost 1 lb since her last visit. She has lost 0 lbs since starting treatment with [name].  Pre-Diabetes Alexis Jones has a diagnosis of pre-diabetes based on her elevated Hgb A1c and was informed this puts her at greater risk of developing diabetes. She is attempting to control with diet, and she notes decreased hypoglycemia on her eating plan.  Depression with Anxiety Alexis Jones started Wellbutrin 3 weeks ago, and she feels it is helping her deal with stress and emotional eating but she is still struggling. She denies insomnia. Alexis Jones struggles with emotional eating and using food for comfort to the extent that it is negatively impacting her health. She often snacks when she is not hungry. Alexis Jones sometimes feels she is out of control and then feels guilty that she made poor food choices. She has been working on behavior modification techniques to help reduce her emotional eating and has  been somewhat successful. She shows no sign of suicidal or homicidal ideations.  ASSESSMENT AND PLAN:  Prediabetes  Depression with anxiety  Class 3 severe obesity with serious comorbidity and body mass index (BMI) of 40.0 to 44.9 in adult, unspecified obesity type ([place])  PLAN:  Pre-Diabetes Alexis Jones will continue to work on diet, exercise, and weight loss, and decreasing simple carbohydrates in her diet to help decrease the risk of diabetes. We dicussed metformin including benefits and risks. She was informed that eating too many simple carbohydrates or too many calories at one sitting increases the likelihood of GI side effects. We will recheck labs in 1 month. [name] agrees to follow up with our clinic in 3 weeks as directed to monitor her progress.  Depression with Anxiety We discussed behavior modification techniques today to help Alexis Jones deal with her emotional eating and depression. Alexis Jones agrees to increase Wellbutrin SR to 200 mg q daily #30 with no refills. Alexis Jones agrees to follow up with our clinic in 3 weeks.   Obesity Lachlyn is currently in the action stage of change. As such, her goal is to continue with weight loss efforts She has agreed to follow the Category 3 plan Marni has been instructed to work up to a goal of 150 minutes of combined cardio and strengthening exercise per week for weight loss and overall health benefits. We discussed the following Behavioral Modification Strategies today: work on meal planning and easy cooking plans, better snacking choices, and emotional eating strategies   Alexis Jones has agreed to  follow up with our clinic in 3 weeks. She was informed of the importance of frequent follow up visits to maximize her success with intensive lifestyle modifications for her multiple health conditions.  ALLERGIES: No Known Allergies  MEDICATIONS: Current Outpatient Medications on File Prior to Visit  Medication Sig Dispense Refill  .  escitalopram (LEXAPRO) 10 MG tablet Take 1 tablet (10 mg total) by mouth daily. 90 tablet 4  . ibuprofen (ADVIL,MOTRIN) 200 MG tablet Take 200 mg by mouth every 8 (eight) hours as needed.    Alexis Jones levonorgestrel (MIRENA) 20 MCG/24HR IUD 1 each by Intrauterine route once. Placed 05/05/15    . Multiple Vitamins-Minerals (WOMENS MULTI VITAMIN & MINERAL PO) Take 1 tablet by mouth daily.    Alexis Jones omeprazole (PRILOSEC) 40 MG capsule Take 1 capsule (40 mg total) by mouth daily. 90 capsule 3  . spironolactone (ALDACTONE) 100 MG tablet Take 1 tablet by mouth daily.    . Vitamin D, Ergocalciferol, (DRISDOL) 1.25 MG (50000 UT) CAPS capsule Take 1 capsule (50,000 Units total) by mouth every 7 (seven) days. 8 capsule 0   No current facility-administered medications on file prior to visit.     PAST MEDICAL HISTORY: Past Medical History:  Diagnosis Date  . Anxiety   . Asthma   . Dyspnea   . Female bladder prolapse   . GERD (gastroesophageal reflux disease)   . Hiatal hernia   . High triglycerides   . HLD (hyperlipidemia)   . Left ovarian cyst   . Low HDL (under 40)   . Lower extremity edema   . Microcytic anemia   . PCOS (polycystic ovarian syndrome)   . Prediabetes   . Rectocele   . Urinary incontinence   . Vitamin D deficiency     PAST SURGICAL HISTORY: Past Surgical History:  Procedure Laterality Date  . BREAST REDUCTION SURGERY  2000  . LIPOMA EXCISION  2015   left shoulder lipoma removed (2)  . TEAR DUCT PROBING  04/1979    SOCIAL HISTORY: Social History   Tobacco Use  . Smoking status: Never Smoker  . Smokeless tobacco: Never Used  Substance Use Topics  . Alcohol use: No  . Drug use: No    FAMILY HISTORY: Family History  Problem Relation Age of Onset  . Hypertension Mother   . Hyperlipidemia Mother   . Thyroid disease Mother   . Anxiety disorder Mother   . Obesity Mother   . Cancer Father        prostate cancer  . Mitral valve prolapse Father   . Obesity Father   .  Breast cancer Maternal Aunt   . Ovarian cancer Maternal Grandmother   . Skin cancer Maternal Grandfather   . Lung cancer Maternal Grandfather     ROS: Review of Systems  Constitutional: Positive for weight loss.  Endo/Heme/Allergies:       Negative hypoglycemia  Psychiatric/Behavioral: Positive for depression. Negative for suicidal ideas. The patient does not have insomnia.     PHYSICAL EXAM: Pt in no acute distress  RECENT LABS AND TESTS: BMET    Component Value Date/Time   NA 136 05/17/2018 1101   K 4.3 05/17/2018 1101   CL 99 05/17/2018 1101   CO2 24 05/17/2018 1101   GLUCOSE 86 05/17/2018 1101   GLUCOSE 94 11/13/2009 0401   BUN 7 05/17/2018 1101   CREATININE 0.85 05/17/2018 1101   CALCIUM 9.2 05/17/2018 1101   GFRNONAA 86 05/17/2018 1101   GFRAA 99 05/17/2018 1101  Lab Results  Component Value Date   HGBA1C 5.8 (H) 05/17/2018   HGBA1C 5.9 (H) 04/24/2018   Lab Results  Component Value Date   INSULIN 13.0 05/17/2018   CBC    Component Value Date/Time   WBC 6.6 05/17/2018 1101   WBC 10.2 11/13/2009 0401   RBC 4.58 05/17/2018 1101   RBC 4.47 11/13/2009 0401   HGB 11.0 (L) 05/17/2018 1101   HCT 35.3 05/17/2018 1101   PLT 307 04/24/2018 1020   MCV 77 (L) 05/17/2018 1101   MCH 24.0 (L) 05/17/2018 1101   MCH 30.0 11/13/2009 0401   MCHC 31.2 (L) 05/17/2018 1101   MCHC 33.8 11/13/2009 0401   RDW 16.2 (H) 05/17/2018 1101   LYMPHSABS 1.7 05/17/2018 1101   MONOABS 1.1 (H) 11/13/2009 0401   EOSABS 0.1 05/17/2018 1101   BASOSABS 0.0 05/17/2018 1101   Iron/TIBC/Ferritin/ %Sat    Component Value Date/Time   IRON 41 04/27/2018 1104   TIBC 350 04/27/2018 1104   FERRITIN 8 (L) 04/27/2018 1104   IRONPCTSAT 12 (L) 04/27/2018 1104   Lipid Panel     Component Value Date/Time   CHOL 176 04/24/2018 1020   TRIG 234 (H) 04/24/2018 1020   HDL 25 (L) 04/24/2018 1020   CHOLHDL 7.0 (H) 04/24/2018 1020   LDLCALC 104 (H) 04/24/2018 1020   Hepatic Function Panel      Component Value Date/Time   PROT 6.8 05/17/2018 1101   ALBUMIN 4.2 05/17/2018 1101   AST 12 05/17/2018 1101   ALT 13 05/17/2018 1101   ALKPHOS 71 05/17/2018 1101   BILITOT 0.3 05/17/2018 1101      Component Value Date/Time   TSH 2.420 05/17/2018 1101   TSH 2.670 04/24/2018 1020      I, Trixie Dredge, am acting as Location manager for Dennard Nip, MD I have reviewed the above documentation for accuracy and completeness, and I agree with the above. -Dennard Nip, MD

## 2018-11-01 ENCOUNTER — Telehealth (INDEPENDENT_AMBULATORY_CARE_PROVIDER_SITE_OTHER): Payer: BC Managed Care – PPO | Admitting: Family Medicine

## 2018-11-01 ENCOUNTER — Encounter (INDEPENDENT_AMBULATORY_CARE_PROVIDER_SITE_OTHER): Payer: Self-pay | Admitting: Family Medicine

## 2018-11-01 ENCOUNTER — Other Ambulatory Visit: Payer: Self-pay

## 2018-11-01 DIAGNOSIS — Z6841 Body Mass Index (BMI) 40.0 and over, adult: Secondary | ICD-10-CM | POA: Diagnosis not present

## 2018-11-01 DIAGNOSIS — F418 Other specified anxiety disorders: Secondary | ICD-10-CM | POA: Diagnosis not present

## 2018-11-07 ENCOUNTER — Other Ambulatory Visit (INDEPENDENT_AMBULATORY_CARE_PROVIDER_SITE_OTHER): Payer: Self-pay | Admitting: Family Medicine

## 2018-11-07 NOTE — Progress Notes (Signed)
Office: [phone]  /  Fax: [phone] TeleHealth Visit:  Alexis Jones has verbally consented to this TeleHealth visit today. The patient is located at home, the provider is located at the [facility] office. The participants in this visit include the listed provider and patient. The visit was conducted today via Doxy.  HPI:   Chief Complaint: OBESITY Alexis Jones is here to discuss her progress with her obesity treatment plan. She is on the Category 3 plan and is following her eating plan approximately 80% of the time. She states she is walking 20 minutes 1-2 times per week. Alexis Jones feels she has done better with following her Category 3 plan and thinks she has lost another 2 lbs since our last visit. She feels she is doing better with emotional eating.  We were unable to weigh the patient today for this TeleHealth visit. She feels as if she has lost weight since her last visit. She has lost 0 lbs since starting treatment with [name].  Depression with emotional eating behaviors Alexis Jones is struggling with emotional eating and using food for comfort to the extent that it is negatively impacting her health. She often snacks when she is not hungry. Alexis Jones sometimes feels she is out of control and then feels guilty that she made poor food choices. She has been working on behavior modification techniques to help reduce her emotional eating and has been somewhat successful. Alexis Jones feels her mood has improved with the addition of the bupropion to her Lexapro. She is doing better with meal planning and her fatigue has improved. She shows no sign of suicidal or homicidal ideations.  Depression screen PHQ 2/9 05/17/2018  Decreased Interest 2  Down, Depressed, Hopeless 1  PHQ - 2 Score 3  Altered sleeping 1  Tired, decreased energy 2  Change in appetite 1  Feeling bad or failure about yourself  0  Trouble concentrating 0  Moving slowly or fidgety/restless 0  Suicidal  thoughts 0  PHQ-9 Score 7  Difficult doing work/chores Not difficult at all   ASSESSMENT AND PLAN:  Depression with anxiety  Class 3 severe obesity with serious comorbidity and body mass index (BMI) of 40.0 to 44.9 in adult, unspecified obesity type ([place])  PLAN:  Depression with Emotional Eating Behaviors We discussed behavior modification techniques today to help Alexis Jones deal with her emotional eating and depression. Alexis Jones will continue Lexapro and bupropion and will continue to monitor.  I spent > than 50% of the 15 minute visit on counseling as documented in the note.  Obesity Alexis Jones is currently in the action stage of change. As such, her goal is to continue with weight loss efforts. She has agreed to follow the Category 3 plan. Alexis Jones has been instructed to increase outdoor play with her kids for weight loss and overall health benefits. We discussed the following Behavioral Modification Strategies today: work on meal planning and easy cooking plans, and dealing with family or coworker sabotage.  Alexis Jones has agreed to follow-up with our clinic in 3 weeks. She was informed of the importance of frequent follow-up visits to maximize her success with intensive lifestyle modifications for her multiple health conditions.  ALLERGIES: No Known Allergies  MEDICATIONS: Current Outpatient Medications on File Prior to Visit  Medication Sig Dispense Refill  . buPROPion (WELLBUTRIN SR) 200 MG 12 hr tablet Take 1 tablet (200 mg total) by mouth daily. 30 tablet 0  . escitalopram (LEXAPRO) 10 MG tablet Take 1 tablet (10 mg total) by mouth  daily. 90 tablet 4  . ibuprofen (ADVIL,MOTRIN) 200 MG tablet Take 200 mg by mouth every 8 (eight) hours as needed.    [name] levonorgestrel (MIRENA) 20 MCG/24HR IUD 1 each by Intrauterine route once. Placed 05/05/15    . Multiple Vitamins-Minerals (WOMENS MULTI VITAMIN & MINERAL PO) Take 1 tablet by mouth daily.    [name] omeprazole (PRILOSEC) 40 MG  capsule Take 1 capsule (40 mg total) by mouth daily. 90 capsule 3  . spironolactone (ALDACTONE) 100 MG tablet Take 1 tablet by mouth daily.    . Vitamin D, Ergocalciferol, (DRISDOL) 1.25 MG (50000 UT) CAPS capsule Take 1 capsule (50,000 Units total) by mouth every 7 (seven) days. 8 capsule 0   No current facility-administered medications on file prior to visit.     PAST MEDICAL HISTORY: Past Medical History:  Diagnosis Date  . Anxiety   . Asthma   . Dyspnea   . Female bladder prolapse   . GERD (gastroesophageal reflux disease)   . Hiatal hernia   . High triglycerides   . HLD (hyperlipidemia)   . Left ovarian cyst   . Low HDL (under 40)   . Lower extremity edema   . Microcytic anemia   . PCOS (polycystic ovarian syndrome)   . Prediabetes   . Rectocele   . Urinary incontinence   . Vitamin D deficiency     PAST SURGICAL HISTORY: Past Surgical History:  Procedure Laterality Date  . BREAST REDUCTION SURGERY  2000  . LIPOMA EXCISION  2015   left shoulder lipoma removed (2)  . TEAR DUCT PROBING  04/1979    SOCIAL HISTORY: Social History   Tobacco Use  . Smoking status: Never Smoker  . Smokeless tobacco: Never Used  Substance Use Topics  . Alcohol use: No  . Drug use: No    FAMILY HISTORY: Family History  Problem Relation Age of Onset  . Hypertension Mother   . Hyperlipidemia Mother   . Thyroid disease Mother   . Anxiety disorder Mother   . Obesity Mother   . Cancer Father        prostate cancer  . Mitral valve prolapse Father   . Obesity Father   . Breast cancer Maternal Aunt   . Ovarian cancer Maternal Grandmother   . Skin cancer Maternal Grandfather   . Lung cancer Maternal Grandfather    ROS: Review of Systems  Psychiatric/Behavioral: Positive for depression. Negative for suicidal ideas.       Negative for homicidal ideas.   PHYSICAL EXAM: Pt in no acute distress  RECENT LABS AND TESTS: BMET    Component Value Date/Time   NA 136 05/17/2018  1101   K 4.3 05/17/2018 1101   CL 99 05/17/2018 1101   CO2 24 05/17/2018 1101   GLUCOSE 86 05/17/2018 1101   GLUCOSE 94 11/13/2009 0401   BUN 7 05/17/2018 1101   CREATININE 0.85 05/17/2018 1101   CALCIUM 9.2 05/17/2018 1101   GFRNONAA 86 05/17/2018 1101   GFRAA 99 05/17/2018 1101   Lab Results  Component Value Date   HGBA1C 5.8 (H) 05/17/2018   HGBA1C 5.9 (H) 04/24/2018   Lab Results  Component Value Date   INSULIN 13.0 05/17/2018   CBC    Component Value Date/Time   WBC 6.6 05/17/2018 1101   WBC 10.2 11/13/2009 0401   RBC 4.58 05/17/2018 1101   RBC 4.47 11/13/2009 0401   HGB 11.0 (L) 05/17/2018 1101   HCT 35.3 05/17/2018 1101   PLT  307 04/24/2018 1020   MCV 77 (L) 05/17/2018 1101   MCH 24.0 (L) 05/17/2018 1101   MCH 30.0 11/13/2009 0401   MCHC 31.2 (L) 05/17/2018 1101   MCHC 33.8 11/13/2009 0401   RDW 16.2 (H) 05/17/2018 1101   LYMPHSABS 1.7 05/17/2018 1101   MONOABS 1.1 (H) 11/13/2009 0401   EOSABS 0.1 05/17/2018 1101   BASOSABS 0.0 05/17/2018 1101   Iron/TIBC/Ferritin/ %Sat    Component Value Date/Time   IRON 41 04/27/2018 1104   TIBC 350 04/27/2018 1104   FERRITIN 8 (L) 04/27/2018 1104   IRONPCTSAT 12 (L) 04/27/2018 1104   Lipid Panel     Component Value Date/Time   CHOL 176 04/24/2018 1020   TRIG 234 (H) 04/24/2018 1020   HDL 25 (L) 04/24/2018 1020   CHOLHDL 7.0 (H) 04/24/2018 1020   LDLCALC 104 (H) 04/24/2018 1020   Hepatic Function Panel     Component Value Date/Time   PROT 6.8 05/17/2018 1101   ALBUMIN 4.2 05/17/2018 1101   AST 12 05/17/2018 1101   ALT 13 05/17/2018 1101   ALKPHOS 71 05/17/2018 1101   BILITOT 0.3 05/17/2018 1101      Component Value Date/Time   TSH 2.420 05/17/2018 1101   TSH 2.670 04/24/2018 1020   Results for KAELANI, BROADSTREET (MRN [id number]) as of 11/07/2018 07:11  Ref. Range 07/20/2018 14:01  Vitamin D, 25-Hydroxy Latest Ref Range: 30.0 - 100.0 ng/mL 52.8   I, Michaelene Song, am acting as Location manager for  Dennard Nip, MD I have reviewed the above documentation for accuracy and completeness, and I agree with the above. -Dennard Nip, MD

## 2018-11-22 ENCOUNTER — Other Ambulatory Visit: Payer: Self-pay

## 2018-11-22 ENCOUNTER — Telehealth (INDEPENDENT_AMBULATORY_CARE_PROVIDER_SITE_OTHER): Payer: BC Managed Care – PPO | Admitting: Family Medicine

## 2018-11-22 ENCOUNTER — Encounter (INDEPENDENT_AMBULATORY_CARE_PROVIDER_SITE_OTHER): Payer: Self-pay | Admitting: Family Medicine

## 2018-11-22 DIAGNOSIS — F418 Other specified anxiety disorders: Secondary | ICD-10-CM

## 2018-11-22 DIAGNOSIS — Z6841 Body Mass Index (BMI) 40.0 and over, adult: Secondary | ICD-10-CM | POA: Diagnosis not present

## 2018-11-22 DIAGNOSIS — E559 Vitamin D deficiency, unspecified: Secondary | ICD-10-CM | POA: Diagnosis not present

## 2018-11-22 MED ORDER — BUPROPION HCL ER (SR) 200 MG PO TB12: 200.0000 mg | ORAL_TABLET | Freq: Every day | ORAL | 0 refills | Status: DC

## 2018-11-22 MED ORDER — VITAMIN D (ERGOCALCIFEROL) 1.25 MG (50000 UNIT) PO CAPS: 50000.0000 [IU] | ORAL_CAPSULE | ORAL | 0 refills | Status: DC

## 2018-11-27 NOTE — Progress Notes (Signed)
Office: [phone]  /  Fax: [phone] TeleHealth Visit:  Alexis Jones has verbally consented to this TeleHealth visit today. The patient is located at home, the provider is located at the [facility] office. The participants in this visit include the listed provider and patient and any and all parties involved. The visit was conducted today via [email or web address].  HPI:   Chief Complaint: OBESITY Alexis Jones is here to discuss her progress with her obesity treatment plan. She is on the Category 3 plan and is following her eating plan approximately 75 % of the time. She states she is just trying to be more active around the house. Alexis Jones continues to do with her eating plan, and feels she is losing weight. She has done better with meal prepping and planning, which has made weight loss easier. Hunger is controlled. We were unable to weigh the patient today for this TeleHealth visit. She feels as if she has lost weight since her last visit (weight not reported). She has lost 0 lbs since starting treatment with [name].  Vitamin D deficiency Alexis Jones has a diagnosis of vitamin D deficiency. Alexis Jones is stable on vit D and her last level was at goal. She denies nausea, vomiting or muscle weakness.  Other Depression  Alexis Jones's mood is stable. She is sleeping better and her fatigue has improved. She is dealing with the pandemic in a healthy way. She shows no sign of suicidal or homicidal ideations.  ASSESSMENT AND PLAN:  Vitamin D deficiency - Plan: Vitamin D, Ergocalciferol, (DRISDOL) 1.25 MG (50000 UT) CAPS capsule  Depression with anxiety - Plan: buPROPion (WELLBUTRIN SR) 200 MG 12 hr tablet  Class 3 severe obesity with serious comorbidity and body mass index (BMI) of 40.0 to 44.9 in adult, unspecified obesity type ([place])  PLAN:  Vitamin D Deficiency Alexis Jones was informed that low vitamin D levels contributes to fatigue and are associated with obesity, breast, and colon  cancer. Alexis Jones agrees to continue to take prescription Vit D @50 ,000 IU every week #8 with no refills and follow up for routine testing of vitamin D, at least 2-3 times per year. She was informed of the risk of over-replacement of vitamin D and agrees to not increase her dose unless she discusses this with [name] first. Alexis Jones agrees to follow up with our clinic in 2 weeks.  Other Depression Alexis Jones has agreed to continue Wellbutrin SR 200 mg daily and follow up with our clinic in 2 weeks..  Obesity Alexis Jones is currently in the action stage of change. As such, her goal is to continue with weight loss efforts She has agreed to follow the Category 3 plan Alexis Jones has been instructed to work up to a goal of 150 minutes of combined cardio and strengthening exercise per week for weight loss and overall health benefits. We discussed the following Behavioral Modification Strategies today: better snacking choices and ways to avoid boredom eating  Alexis Jones has agreed to follow up with our clinic in 2 weeks. She was informed of the importance of frequent follow up visits to maximize her success with intensive lifestyle modifications for her multiple health conditions.  ALLERGIES: No Known Allergies  MEDICATIONS: Current Outpatient Medications on File Prior to Visit  Medication Sig Dispense Refill   escitalopram (LEXAPRO) 10 MG tablet Take 1 tablet (10 mg total) by mouth daily. 90 tablet 4   ibuprofen (ADVIL,MOTRIN) 200 MG tablet Take 200 mg by mouth every 8 (eight) hours as needed.     levonorgestrel (MIRENA)  20 MCG/24HR IUD 1 each by Intrauterine route once. Placed 05/05/15     Multiple Vitamins-Minerals (WOMENS MULTI VITAMIN & MINERAL PO) Take 1 tablet by mouth daily.     omeprazole (PRILOSEC) 40 MG capsule Take 1 capsule (40 mg total) by mouth daily. 90 capsule 3   spironolactone (ALDACTONE) 100 MG tablet Take 1 tablet by mouth daily.     No current facility-administered medications on  file prior to visit.     PAST MEDICAL HISTORY: Past Medical History:  Diagnosis Date   Anxiety    Asthma    Dyspnea    Female bladder prolapse    GERD (gastroesophageal reflux disease)    Hiatal hernia    High triglycerides    HLD (hyperlipidemia)    Left ovarian cyst    Low HDL (under 40)    Lower extremity edema    Microcytic anemia    PCOS (polycystic ovarian syndrome)    Prediabetes    Rectocele    Urinary incontinence    Vitamin D deficiency     PAST SURGICAL HISTORY: Past Surgical History:  Procedure Laterality Date   BREAST REDUCTION SURGERY  2000   LIPOMA EXCISION  2015   left shoulder lipoma removed (2)   TEAR DUCT PROBING  04/1979    SOCIAL HISTORY: Social History   Tobacco Use   Smoking status: Never Smoker   Smokeless tobacco: Never Used  Substance Use Topics   Alcohol use: No   Drug use: No    FAMILY HISTORY: Family History  Problem Relation Age of Onset   Hypertension Mother    Hyperlipidemia Mother    Thyroid disease Mother    Anxiety disorder Mother    Obesity Mother    Cancer Father        prostate cancer   Mitral valve prolapse Father    Obesity Father    Breast cancer Maternal Aunt    Ovarian cancer Maternal Grandmother    Skin cancer Maternal Grandfather    Lung cancer Maternal Grandfather     ROS: Review of Systems  Constitutional: Positive for weight loss. Negative for malaise/fatigue.  Gastrointestinal: Negative for nausea and vomiting.  Musculoskeletal:       Negative for muscle weakness  Endo/Heme/Allergies:       Negative for polyphagia  Psychiatric/Behavioral: Positive for depression. Negative for suicidal ideas. The patient does not have insomnia.     PHYSICAL EXAM: Pt in no acute distress  RECENT LABS AND TESTS: BMET    Component Value Date/Time   NA 136 05/17/2018 1101   K 4.3 05/17/2018 1101   CL 99 05/17/2018 1101   CO2 24 05/17/2018 1101   GLUCOSE 86 05/17/2018  1101   GLUCOSE 94 11/13/2009 0401   BUN 7 05/17/2018 1101   CREATININE 0.85 05/17/2018 1101   CALCIUM 9.2 05/17/2018 1101   GFRNONAA 86 05/17/2018 1101   GFRAA 99 05/17/2018 1101   Lab Results  Component Value Date   HGBA1C 5.8 (H) 05/17/2018   HGBA1C 5.9 (H) 04/24/2018   Lab Results  Component Value Date   INSULIN 13.0 05/17/2018   CBC    Component Value Date/Time   WBC 6.6 05/17/2018 1101   WBC 10.2 11/13/2009 0401   RBC 4.58 05/17/2018 1101   RBC 4.47 11/13/2009 0401   HGB 11.0 (L) 05/17/2018 1101   HCT 35.3 05/17/2018 1101   PLT 307 04/24/2018 1020   MCV 77 (L) 05/17/2018 1101   MCH 24.0 (L) 05/17/2018 1101  MCH 30.0 11/13/2009 0401   MCHC 31.2 (L) 05/17/2018 1101   MCHC 33.8 11/13/2009 0401   RDW 16.2 (H) 05/17/2018 1101   LYMPHSABS 1.7 05/17/2018 1101   MONOABS 1.1 (H) 11/13/2009 0401   EOSABS 0.1 05/17/2018 1101   BASOSABS 0.0 05/17/2018 1101   Iron/TIBC/Ferritin/ %Sat    Component Value Date/Time   IRON 41 04/27/2018 1104   TIBC 350 04/27/2018 1104   FERRITIN 8 (L) 04/27/2018 1104   IRONPCTSAT 12 (L) 04/27/2018 1104   Lipid Panel     Component Value Date/Time   CHOL 176 04/24/2018 1020   TRIG 234 (H) 04/24/2018 1020   HDL 25 (L) 04/24/2018 1020   CHOLHDL 7.0 (H) 04/24/2018 1020   LDLCALC 104 (H) 04/24/2018 1020   Hepatic Function Panel     Component Value Date/Time   PROT 6.8 05/17/2018 1101   ALBUMIN 4.2 05/17/2018 1101   AST 12 05/17/2018 1101   ALT 13 05/17/2018 1101   ALKPHOS 71 05/17/2018 1101   BILITOT 0.3 05/17/2018 1101      Component Value Date/Time   TSH 2.420 05/17/2018 1101   TSH 2.670 04/24/2018 1020     Ref. Range 07/20/2018 14:01  Vitamin D, 25-Hydroxy Latest Ref Range: 30.0 - 100.0 ng/mL 52.8    I, Doreene Nest, am acting as Location manager for Dennard Nip, MD I have reviewed the above documentation for accuracy and completeness, and I agree with the above. -Dennard Nip, MD

## 2018-12-06 ENCOUNTER — Encounter (INDEPENDENT_AMBULATORY_CARE_PROVIDER_SITE_OTHER): Payer: Self-pay

## 2018-12-07 ENCOUNTER — Ambulatory Visit (INDEPENDENT_AMBULATORY_CARE_PROVIDER_SITE_OTHER): Payer: BC Managed Care – PPO | Admitting: Family Medicine

## 2018-12-18 ENCOUNTER — Other Ambulatory Visit (INDEPENDENT_AMBULATORY_CARE_PROVIDER_SITE_OTHER): Payer: Self-pay | Admitting: Family Medicine

## 2018-12-18 DIAGNOSIS — F418 Other specified anxiety disorders: Secondary | ICD-10-CM

## 2018-12-28 ENCOUNTER — Ambulatory Visit (INDEPENDENT_AMBULATORY_CARE_PROVIDER_SITE_OTHER): Payer: BC Managed Care – PPO | Admitting: Family Medicine

## 2018-12-28 ENCOUNTER — Encounter (INDEPENDENT_AMBULATORY_CARE_PROVIDER_SITE_OTHER): Payer: Self-pay | Admitting: Family Medicine

## 2018-12-28 ENCOUNTER — Other Ambulatory Visit: Payer: Self-pay

## 2018-12-28 VITALS — BP 124/81 | HR 78 | Temp 97.9°F | Ht 66.0 in | Wt 244.0 lb

## 2018-12-28 DIAGNOSIS — Z9189 Other specified personal risk factors, not elsewhere classified: Secondary | ICD-10-CM | POA: Diagnosis not present

## 2018-12-28 DIAGNOSIS — E559 Vitamin D deficiency, unspecified: Secondary | ICD-10-CM | POA: Diagnosis not present

## 2018-12-28 DIAGNOSIS — E786 Lipoprotein deficiency: Secondary | ICD-10-CM | POA: Diagnosis not present

## 2018-12-28 DIAGNOSIS — R5383 Other fatigue: Secondary | ICD-10-CM | POA: Diagnosis not present

## 2018-12-28 DIAGNOSIS — R7303 Prediabetes: Secondary | ICD-10-CM

## 2018-12-28 DIAGNOSIS — Z6841 Body Mass Index (BMI) 40.0 and over, adult: Secondary | ICD-10-CM

## 2018-12-28 DIAGNOSIS — F3289 Other specified depressive episodes: Secondary | ICD-10-CM

## 2018-12-28 MED ORDER — VITAMIN D (ERGOCALCIFEROL) 1.25 MG (50000 UNIT) PO CAPS: 50000.0000 [IU] | ORAL_CAPSULE | ORAL | 0 refills | Status: DC

## 2018-12-28 MED ORDER — BUPROPION HCL ER (SR) 200 MG PO TB12: 200.0000 mg | ORAL_TABLET | Freq: Every day | ORAL | 0 refills | Status: DC

## 2018-12-29 LAB — LIPID PANEL WITH LDL/HDL RATIO
Cholesterol, Total: 181 mg/dL (ref 100–199)
HDL: 30 mg/dL — ABNORMAL LOW (ref >39)
LDL Chol Calc (NIH): 117 mg/dL — ABNORMAL HIGH (ref 0–99)
LDL/HDL Ratio: 3.9 ratio — ABNORMAL HIGH (ref 0.0–3.2)
Triglycerides: 192 mg/dL — ABNORMAL HIGH (ref 0–149)
VLDL Cholesterol Cal: 34 mg/dL (ref 5–40)

## 2018-12-29 LAB — CBC WITH DIFFERENTIAL/PLATELET
Basophils Absolute: 0 10*3/uL (ref 0.0–0.2)
Basos: 1 %
EOS (ABSOLUTE): 0.2 10*3/uL (ref 0.0–0.4)
Eos: 2 %
Hematocrit: 37.1 % (ref 34.0–46.6)
Hemoglobin: 12.7 g/dL (ref 11.1–15.9)
Immature Grans (Abs): 0 10*3/uL (ref 0.0–0.1)
Immature Granulocytes: 1 %
Lymphocytes Absolute: 1.9 10*3/uL (ref 0.7–3.1)
Lymphs: 24 %
MCH: 28.8 pg (ref 26.6–33.0)
MCHC: 34.2 g/dL (ref 31.5–35.7)
MCV: 84 fL (ref 79–97)
Monocytes Absolute: 0.5 10*3/uL (ref 0.1–0.9)
Monocytes: 6 %
Neutrophils Absolute: 5.5 10*3/uL (ref 1.4–7.0)
Neutrophils: 66 %
Platelets: 259 10*3/uL (ref 150–450)
RBC: 4.41 x10E6/uL (ref 3.77–5.28)
RDW: 13.5 % (ref 11.7–15.4)
WBC: 8.2 10*3/uL (ref 3.4–10.8)

## 2018-12-29 LAB — COMPREHENSIVE METABOLIC PANEL
ALT: 16 IU/L (ref 0–32)
AST: 15 IU/L (ref 0–40)
Albumin/Globulin Ratio: 1.6 (ref 1.2–2.2)
Albumin: 4.1 g/dL (ref 3.8–4.8)
Alkaline Phosphatase: 71 IU/L (ref 39–117)
BUN/Creatinine Ratio: 11 (ref 9–23)
BUN: 9 mg/dL (ref 6–24)
Bilirubin Total: 0.2 mg/dL (ref 0.0–1.2)
CO2: 19 mmol/L — ABNORMAL LOW (ref 20–29)
Calcium: 8.9 mg/dL (ref 8.7–10.2)
Chloride: 102 mmol/L (ref 96–106)
Creatinine, Ser: 0.85 mg/dL (ref 0.57–1.00)
GFR calc Af Amer: 99 mL/min/{1.73_m2} (ref >59)
GFR calc non Af Amer: 86 mL/min/{1.73_m2} (ref >59)
Globulin, Total: 2.6 g/dL (ref 1.5–4.5)
Glucose: 147 mg/dL — ABNORMAL HIGH (ref 65–99)
Potassium: 3.7 mmol/L (ref 3.5–5.2)
Sodium: 138 mmol/L (ref 134–144)
Total Protein: 6.7 g/dL (ref 6.0–8.5)

## 2018-12-29 LAB — VITAMIN B12: Vitamin B-12: 646 pg/mL (ref 232–1245)

## 2018-12-29 LAB — HEMOGLOBIN A1C
Est. average glucose Bld gHb Est-mCnc: 105 mg/dL
Hgb A1c MFr Bld: 5.3 % (ref 4.8–5.6)

## 2018-12-29 LAB — TSH: TSH: 2.01 u[IU]/mL (ref 0.450–4.500)

## 2018-12-29 LAB — T4, FREE: Free T4: 0.79 ng/dL — ABNORMAL LOW (ref 0.82–1.77)

## 2018-12-29 LAB — VITAMIN D 25 HYDROXY (VIT D DEFICIENCY, FRACTURES): Vit D, 25-Hydroxy: 41 ng/mL (ref 30.0–100.0)

## 2018-12-29 LAB — FOLATE: Folate: >20 ng/mL (ref >3.0)

## 2018-12-29 LAB — T3: T3, Total: 116 ng/dL (ref 71–180)

## 2018-12-29 LAB — INSULIN, RANDOM: INSULIN: 104 u[IU]/mL — ABNORMAL HIGH (ref 2.6–24.9)

## 2019-01-04 NOTE — Progress Notes (Signed)
Office: [phone]  /  Fax: [phone]   HPI:   Chief Complaint: OBESITY Alexis Jones is here to discuss her progress with her obesity treatment plan. She is on the Category 3 plan and is following her eating plan approximately 75% of the time. She states she is exercising 0 minutes 0 times per week. [name]'s last in-office visit was 7 months ago. She has lost 5 lbs of fat during the pandemic and is wearing a smaller pant size. She is doing well with her Category 3 plan, but is struggling with dinner. Her weight is 244 lb (110.7 kg) today and has had a weight loss of 5 pounds over a period of 7 months since her last in-office visit. She has lost 5 lbs since starting treatment with [name].  Depression with emotional eating behaviors Alexis Jones is struggling with emotional eating and using food for comfort to the extent that it is negatively impacting her health. She often snacks when she is not hungry. Alexis Jones sometimes feels she is out of control and then feels guilty that she made poor food choices. She has been working on behavior modification techniques to help reduce her emotional eating and has been somewhat successful. Alexis Jones is stable on Wellbutrin with no reports of insomnia. Her blood pressure is stable and she reports doing well with decreased emotional eating. She shows no sign of suicidal or homicidal ideations.  Depression screen PHQ 2/9 05/17/2018  Decreased Interest 2  Down, Depressed, Hopeless 1  PHQ - 2 Score 3  Altered sleeping 1  Tired, decreased energy 2  Change in appetite 1  Feeling bad or failure about yourself  0  Trouble concentrating 0  Moving slowly or fidgety/restless 0  Suicidal thoughts 0  PHQ-9 Score 7  Difficult doing work/chores Not difficult at all   Vitamin D deficiency Alexis Jones has a diagnosis of Vitamin D deficiency, which is not yet at goal. She is currently stable on prescription Vit D and denies nausea, vomiting or muscle weakness.   Fatigue Alexis Jones notes no improvement in her fatigue. She is due to have labs checked.  Pre-Diabetes Alexis Jones has a diagnosis of prediabetes based on her elevated Hgb A1c and was informed this puts her at greater risk of developing diabetes. She is not taking metformin currently and is stable on her diet. She denies nausea or hypoglycemia. Alexis Jones is due to have labs checked.  Low HDL Alexis Jones has a history of low HDL and is working on diet and increasing her exercise. She is due to have labs checked.  ASSESSMENT AND PLAN:  Vitamin D deficiency - Plan: Vitamin D, Ergocalciferol, (DRISDOL) 1.25 MG (50000 UT) CAPS capsule, VITAMIN D 25 Hydroxy (Vit-D Deficiency, Fractures)  Prediabetes - Plan: Comprehensive metabolic panel, Hemoglobin A1c, Insulin, random  Other fatigue - Plan: CBC with Differential/Platelet, Vitamin B12, Folate, T3, T4, free, TSH  Low HDL (under 40) - Plan: Lipid Panel With LDL/HDL Ratio  Other depression - with emotional eating - Plan: buPROPion (WELLBUTRIN SR) 200 MG 12 hr tablet  Class 3 severe obesity with serious comorbidity and body mass index (BMI) of 40.0 to 44.9 in adult, unspecified obesity type (HCC)  PLAN:  Depression with Emotional Eating Behaviors We discussed behavior modification techniques today to help [name] deal with her emotional eating and depression. Alexis Jones was given a refill on her Wellbutrin 200 mg #30 with 0 refills and agrees to follow-up with our clinic in 2-3 weeks.  Vitamin D Deficiency Alexis Jones was informed that low Vitamin D  levels contributes to fatigue and are associated with obesity, breast, and colon cancer. She agrees to continue to take prescription Vit D @ 50,000 IU every week #8 with 0 refills and will follow-up for routine testing of Vitamin D, at least 2-3 times per year. She was informed of the risk of over-replacement of Vitamin D and agrees to not increase her dose unless she discusses this with [name] first. Alexis Jones  agrees to follow-up with our clinic in 2-3 weeks.  Fatigue Alexis Jones was informed that her fatigue may be related to obesity, depression or many other causes. Labs will be ordered, and in the meanwhile Alexis Jones has agreed to work on diet, exercise and weight loss to help with fatigue. Proper sleep hygiene was discussed including the need for 7-8 hours of quality sleep each night.  Pre-Diabetes Alexis Jones will continue to work on weight loss, exercise, and decreasing simple carbohydrates in her diet to help decrease the risk of diabetes. We dicussed metformin including benefits and risks. She was informed that eating too many simple carbohydrates or too many calories at one sitting increases the likelihood of GI side effects. Alexis Jones will have labs checked and follow-up as directed for review of lab results.  Low HDL [name] will have labs drawn and will follow-up as directed for review of lab results.  Diabetes risk counselling Alexis Jones was given extended (15 minutes) diabetes prevention counseling today. She is 38 y.o. female and has risk factors for diabetes including obesity. We discussed intensive lifestyle modifications today with an emphasis on weight loss as well as increasing exercise and decreasing simple carbohydrates in her diet.  Obesity Alexis Jones is currently in the action stage of change. As such, her goal is to continue with weight loss efforts. She has agreed to follow the Category 3 plan and will journal 400-600 calories and 40+ grams of protein at supper. Alexis Jones has been instructed to work up to a goal of 150 minutes of combined cardio and strengthening exercise per week for weight loss and overall health benefits. We discussed the following Behavioral Modification Strategies today: work on meal planning and easy cooking plans.  Alexis Jones has agreed to follow-up with our clinic in 2-3 weeks. She was informed of the importance of frequent follow-up visits to maximize her  success with intensive lifestyle modifications for her multiple health conditions. . ALLERGIES: No Known Allergies  MEDICATIONS: Current Outpatient Medications on File Prior to Visit  Medication Sig Dispense Refill  . escitalopram (LEXAPRO) 10 MG tablet Take 1 tablet (10 mg total) by mouth daily. 90 tablet 4  . ibuprofen (ADVIL,MOTRIN) 200 MG tablet Take 200 mg by mouth every 8 (eight) hours as needed.    [name] levonorgestrel (MIRENA) 20 MCG/24HR IUD 1 each by Intrauterine route once. Placed 05/05/15    . Multiple Vitamins-Minerals (WOMENS MULTI VITAMIN & MINERAL PO) Take 1 tablet by mouth daily.    [name] omeprazole (PRILOSEC) 40 MG capsule Take 1 capsule (40 mg total) by mouth daily. 90 capsule 3  . spironolactone (ALDACTONE) 100 MG tablet Take 1 tablet by mouth daily.     No current facility-administered medications on file prior to visit.     PAST MEDICAL HISTORY: Past Medical History:  Diagnosis Date  . Anxiety   . Asthma   . Dyspnea   . Female bladder prolapse   . GERD (gastroesophageal reflux disease)   . Hiatal hernia   . High triglycerides   . HLD (hyperlipidemia)   . Left ovarian cyst   .  Low HDL (under 40)   . Lower extremity edema   . Microcytic anemia   . PCOS (polycystic ovarian syndrome)   . Prediabetes   . Rectocele   . Urinary incontinence   . Vitamin D deficiency     PAST SURGICAL HISTORY: Past Surgical History:  Procedure Laterality Date  . BREAST REDUCTION SURGERY  2000  . LIPOMA EXCISION  2015   left shoulder lipoma removed (2)  . TEAR DUCT PROBING  04/1979    SOCIAL HISTORY: Social History   Tobacco Use  . Smoking status: Never Smoker  . Smokeless tobacco: Never Used  Substance Use Topics  . Alcohol use: No  . Drug use: No    FAMILY HISTORY: Family History  Problem Relation Age of Onset  . Hypertension Mother   . Hyperlipidemia Mother   . Thyroid disease Mother   . Anxiety disorder Mother   . Obesity Mother   . Cancer Father         prostate cancer  . Mitral valve prolapse Father   . Obesity Father   . Breast cancer Maternal Aunt   . Ovarian cancer Maternal Grandmother   . Skin cancer Maternal Grandfather   . Lung cancer Maternal Grandfather    ROS: Review of Systems  Constitutional: Positive for malaise/fatigue.  Gastrointestinal: Negative for nausea and vomiting.  Musculoskeletal:       Negative for muscle weakness.  Endo/Heme/Allergies:       Negative for hypoglycemia.  Psychiatric/Behavioral: Positive for depression. Negative for suicidal ideas. The patient does not have insomnia.        Negative for homicidal ideas.   PHYSICAL EXAM: Blood pressure 124/81, pulse 78, temperature 97.9 F (36.6 C), temperature source Oral, height 5\' 6"  (1.676 m), weight 244 lb (110.7 kg), SpO2 98 %. Body mass index is 39.38 kg/m. Physical Exam Vitals signs reviewed.  Constitutional:      Appearance: Normal appearance. She is obese.  Cardiovascular:     Rate and Rhythm: Normal rate.     Pulses: Normal pulses.  Pulmonary:     Effort: Pulmonary effort is normal.     Breath sounds: Normal breath sounds.  Musculoskeletal: Normal range of motion.  Skin:    General: Skin is warm and dry.  Neurological:     Mental Status: She is alert and oriented to person, place, and time.  Psychiatric:        Behavior: Behavior normal.   RECENT LABS AND TESTS: BMET    Component Value Date/Time   NA 138 12/28/2018 1716   K 3.7 12/28/2018 1716   CL 102 12/28/2018 1716   CO2 19 (L) 12/28/2018 1716   GLUCOSE 147 (H) 12/28/2018 1716   GLUCOSE 94 11/13/2009 0401   BUN 9 12/28/2018 1716   CREATININE 0.85 12/28/2018 1716   CALCIUM 8.9 12/28/2018 1716   GFRNONAA 86 12/28/2018 1716   GFRAA 99 12/28/2018 1716   Lab Results  Component Value Date   HGBA1C 5.3 12/28/2018   HGBA1C 5.8 (H) 05/17/2018   HGBA1C 5.9 (H) 04/24/2018   Lab Results  Component Value Date   INSULIN 104.0 (H) 12/28/2018   INSULIN 13.0 05/17/2018   CBC     Component Value Date/Time   WBC 8.2 12/28/2018 1716   WBC 10.2 11/13/2009 0401   RBC 4.41 12/28/2018 1716   RBC 4.47 11/13/2009 0401   HGB 12.7 12/28/2018 1716   HCT 37.1 12/28/2018 1716   PLT 259 12/28/2018 1716   MCV 84  12/28/2018 1716   MCH 28.8 12/28/2018 1716   MCH 30.0 11/13/2009 0401   MCHC 34.2 12/28/2018 1716   MCHC 33.8 11/13/2009 0401   RDW 13.5 12/28/2018 1716   LYMPHSABS 1.9 12/28/2018 1716   MONOABS 1.1 (H) 11/13/2009 0401   EOSABS 0.2 12/28/2018 1716   BASOSABS 0.0 12/28/2018 1716   Iron/TIBC/Ferritin/ %Sat    Component Value Date/Time   IRON 41 04/27/2018 1104   TIBC 350 04/27/2018 1104   FERRITIN 8 (L) 04/27/2018 1104   IRONPCTSAT 12 (L) 04/27/2018 1104   Lipid Panel     Component Value Date/Time   CHOL 181 12/28/2018 1716   TRIG 192 (H) 12/28/2018 1716   HDL 30 (L) 12/28/2018 1716   CHOLHDL 7.0 (H) 04/24/2018 1020   LDLCALC 117 (H) 12/28/2018 1716   Hepatic Function Panel     Component Value Date/Time   PROT 6.7 12/28/2018 1716   ALBUMIN 4.1 12/28/2018 1716   AST 15 12/28/2018 1716   ALT 16 12/28/2018 1716   ALKPHOS 71 12/28/2018 1716   BILITOT 0.2 12/28/2018 1716      Component Value Date/Time   TSH 2.010 12/28/2018 1716   TSH 2.420 05/17/2018 1101   TSH 2.670 04/24/2018 1020   Results for SUZONNE, NAND (MRN [id number]) as of 01/04/2019 09:44  Ref. Range 12/28/2018 17:16  Vitamin D, 25-Hydroxy Latest Ref Range: 30.0 - 100.0 ng/mL 41.0   OBESITY BEHAVIORAL INTERVENTION VISIT  Today's visit was #14   Starting weight: 249 lbs Starting date: 05/17/2018 Today's weight: 244 lbs  Today's date: 12/28/2018 Total lbs lost to date: 5    12/28/2018  Height 5\' 6"  (1.676 m)  Weight 244 lb (110.7 kg)  BMI (Calculated) 39.4  BLOOD PRESSURE - SYSTOLIC [id number]  BLOOD PRESSURE - DIASTOLIC 81   Body Fat % [id number] %  Total Body Water (lbs) 95.8 lbs   ASK: We discussed the diagnosis of obesity with Roselie Awkward today and Ayrin  agreed to give [name] permission to discuss obesity behavioral modification therapy today.  ASSESS: Rune has the diagnosis of obesity and her BMI today is 39.5. Genises is in the action stage of change.   ADVISE: Muntaz was educated on the multiple health risks of obesity as well as the benefit of weight loss to improve her health. She was advised of the need for long term treatment and the importance of lifestyle modifications to improve her current health and to decrease her risk of future health problems.  AGREE: Multiple dietary modification options and treatment options were discussed and  Merle agreed to follow the recommendations documented in the above note.  ARRANGE: Oletta was educated on the importance of frequent visits to treat obesity as outlined per CMS and USPSTF guidelines and agreed to schedule her next follow up appointment today.  I, Michaelene Song, am acting as Location manager for Dennard Nip, MD  I have reviewed the above documentation for accuracy and completeness, and I agree with the above. -Dennard Nip, MD

## 2019-01-17 ENCOUNTER — Telehealth (INDEPENDENT_AMBULATORY_CARE_PROVIDER_SITE_OTHER): Payer: BC Managed Care – PPO | Admitting: Family Medicine

## 2019-01-17 ENCOUNTER — Encounter (INDEPENDENT_AMBULATORY_CARE_PROVIDER_SITE_OTHER): Payer: Self-pay | Admitting: Family Medicine

## 2019-01-17 ENCOUNTER — Other Ambulatory Visit: Payer: Self-pay

## 2019-01-17 DIAGNOSIS — Z6839 Body mass index (BMI) 39.0-39.9, adult: Secondary | ICD-10-CM | POA: Diagnosis not present

## 2019-01-17 DIAGNOSIS — E559 Vitamin D deficiency, unspecified: Secondary | ICD-10-CM | POA: Diagnosis not present

## 2019-01-17 DIAGNOSIS — F3289 Other specified depressive episodes: Secondary | ICD-10-CM | POA: Diagnosis not present

## 2019-01-17 MED ORDER — VITAMIN D (ERGOCALCIFEROL) 1.25 MG (50000 UNIT) PO CAPS: 50000.0000 [IU] | ORAL_CAPSULE | ORAL | 0 refills | Status: DC

## 2019-01-17 MED ORDER — BUPROPION HCL ER (SR) 200 MG PO TB12: 200.0000 mg | ORAL_TABLET | Freq: Every day | ORAL | 0 refills | Status: DC

## 2019-01-22 NOTE — Progress Notes (Signed)
Office: [phone]  /  Fax: [phone] TeleHealth Visit:  Alexis Jones has verbally consented to this TeleHealth visit today. The patient is located at home, the provider is located at the [employer] [facility] office. The participants in this visit include the listed provider and patient. The visit was conducted today via [email or web address].  HPI:   Chief Complaint: OBESITY Alexis Jones is here to discuss her progress with her obesity treatment plan. She is on the keep a food journal with 400-600 calories and 40+ grams of protein at supper daily and follow the Category 3 plan and is following her eating plan approximately 75 % of the time. She states she is active with playing in the yard with the kids and dog. Alexis Jones feels she has done well maintaining her weight. She is working at home but her stress level has improved. She would like to talk about holiday eating strategies. She states her weight is 242 lbs today We were unable to weigh the patient today for this TeleHealth visit. She feels as if she has maintained her weight since her last visit. She has lost 5 lbs since starting treatment with [name].  Vitamin D Deficiency Alexis Jones has a diagnosis of vitamin D deficiency. Labs results were discussed today. Her Vit D level has worsened and is no longer at goal. She denies nausea, vomiting or muscle weakness.  Depression with Emotional Eating Behaviors Alexis Jones's mood has improved on Wellbutrin with decreased emotional eating. She is sleeping well and denies palpitations. Alexis Jones struggles with emotional eating and using food for comfort to the extent that it is negatively impacting her health. She often snacks when she is not hungry. Alexis Jones sometimes feels she is out of control and then feels guilty that she made poor food choices. She has been working on behavior modification techniques to help reduce her emotional eating and has been somewhat successful. She shows no sign of  suicidal or homicidal ideations.  Depression screen [facility] 2/9 05/17/2018  Decreased Interest 2  Down, Depressed, Hopeless 1  PHQ - 2 Score 3  Altered sleeping 1  Tired, decreased energy 2  Change in appetite 1  Feeling bad or failure about yourself  0  Trouble concentrating 0  Moving slowly or fidgety/restless 0  Suicidal thoughts 0  PHQ-9 Score 7  Difficult doing work/chores Not difficult at all    ASSESSMENT AND PLAN:  Vitamin D deficiency - Plan: Vitamin D, Ergocalciferol, (DRISDOL) 1.25 MG (50000 UT) CAPS capsule  Other depression - with emotional eating - Plan: buPROPion (WELLBUTRIN SR) 200 MG 12 hr tablet  Class 2 severe obesity with serious comorbidity and body mass index (BMI) of 39.0 to 39.9 in adult, unspecified obesity type (HCC)  PLAN:  Vitamin D Deficiency Erendida was informed that low vitamin D levels contributes to fatigue and are associated with obesity, breast, and colon cancer. Alexis Jones agrees to continue taking prescription Vit D 50,000 IU every week #8 with no refills. She will follow up for routine testing of vitamin D, at least 2-3 times per year. She was informed of the risk of over-replacement of vitamin D and agrees to not increase her dose unless she discusses this with [name] first. We will recheck labs in 2 months. Alexis Jones agrees to follow up with our clinic in 3 weeks.  Depression with Emotional Eating Behaviors We discussed behavior modification techniques today to help Alexis Jones deal with her emotional eating and depression. Alexis Jones agrees to continue taking Wellbutrin SR 200 mg PO daily #30  and we will refill for 1 month. [name] agrees to follow up with our clinic in 3 weeks.  Obesity Alexis Jones is currently in the action stage of change. As such, her goal is to continue with weight loss efforts She has agreed to follow the Category 3 plan Alexis Jones has been instructed to work up to a goal of 150 minutes of combined cardio and strengthening  exercise per week for weight loss and overall health benefits. We discussed the following Behavioral Modification Strategies today: holiday eating strategies    Cloretta has agreed to follow up with our clinic in 3 weeks with myself. She was informed of the importance of frequent follow up visits to maximize her success with intensive lifestyle modifications for her multiple health conditions.  ALLERGIES: No Known Allergies  MEDICATIONS: Current Outpatient Medications on File Prior to Visit  Medication Sig Dispense Refill  . escitalopram (LEXAPRO) 10 MG tablet Take 1 tablet (10 mg total) by mouth daily. 90 tablet 4  . ibuprofen (ADVIL,MOTRIN) 200 MG tablet Take 200 mg by mouth every 8 (eight) hours as needed.    Alexis Jones levonorgestrel (MIRENA) 20 MCG/24HR IUD 1 each by Intrauterine route once. Placed 05/05/15    . Multiple Vitamins-Minerals (WOMENS MULTI VITAMIN & MINERAL PO) Take 1 tablet by mouth daily.    Alexis Jones omeprazole (PRILOSEC) 40 MG capsule Take 1 capsule (40 mg total) by mouth daily. 90 capsule 3  . spironolactone (ALDACTONE) 100 MG tablet Take 1 tablet by mouth daily.     No current facility-administered medications on file prior to visit.     PAST MEDICAL HISTORY: Past Medical History:  Diagnosis Date  . Anxiety   . Asthma   . Dyspnea   . Female bladder prolapse   . GERD (gastroesophageal reflux disease)   . Hiatal hernia   . High triglycerides   . HLD (hyperlipidemia)   . Left ovarian cyst   . Low HDL (under 40)   . Lower extremity edema   . Microcytic anemia   . PCOS (polycystic ovarian syndrome)   . Prediabetes   . Rectocele   . Urinary incontinence   . Vitamin D deficiency     PAST SURGICAL HISTORY: Past Surgical History:  Procedure Laterality Date  . BREAST REDUCTION SURGERY  2000  . LIPOMA EXCISION  2015   left shoulder lipoma removed (2)  . TEAR DUCT PROBING  04/1979    SOCIAL HISTORY: Social History   Tobacco Use  . Smoking status: Never Smoker  .  Smokeless tobacco: Never Used  Substance Use Topics  . Alcohol use: No  . Drug use: No    FAMILY HISTORY: Family History  Problem Relation Age of Onset  . Hypertension Mother   . Hyperlipidemia Mother   . Thyroid disease Mother   . Anxiety disorder Mother   . Obesity Mother   . Cancer Father        prostate cancer  . Mitral valve prolapse Father   . Obesity Father   . Breast cancer Maternal Aunt   . Ovarian cancer Maternal Grandmother   . Skin cancer Maternal Grandfather   . Lung cancer Maternal Grandfather     ROS: Review of Systems  Constitutional: Negative for weight loss.  Gastrointestinal: Negative for nausea and vomiting.  Musculoskeletal:       Negative muscle weakness  Psychiatric/Behavioral: Positive for depression. Negative for suicidal ideas.    PHYSICAL EXAM: Pt in no acute distress  RECENT LABS AND TESTS: BMET  Component Value Date/Time   NA 138 12/28/2018 1716   K 3.7 12/28/2018 1716   CL 102 12/28/2018 1716   CO2 19 (L) 12/28/2018 1716   GLUCOSE 147 (H) 12/28/2018 1716   GLUCOSE 94 11/13/2009 0401   BUN 9 12/28/2018 1716   CREATININE 0.85 12/28/2018 1716   CALCIUM 8.9 12/28/2018 1716   GFRNONAA 86 12/28/2018 1716   GFRAA 99 12/28/2018 1716   Lab Results  Component Value Date   HGBA1C 5.3 12/28/2018   HGBA1C 5.8 (H) 05/17/2018   HGBA1C 5.9 (H) 04/24/2018   Lab Results  Component Value Date   INSULIN 104.0 (H) 12/28/2018   INSULIN 13.0 05/17/2018   CBC    Component Value Date/Time   WBC 8.2 12/28/2018 1716   WBC 10.2 11/13/2009 0401   RBC 4.41 12/28/2018 1716   RBC 4.47 11/13/2009 0401   HGB 12.7 12/28/2018 1716   HCT 37.1 12/28/2018 1716   PLT 259 12/28/2018 1716   MCV 84 12/28/2018 1716   MCH 28.8 12/28/2018 1716   MCH 30.0 11/13/2009 0401   MCHC 34.2 12/28/2018 1716   MCHC 33.8 11/13/2009 0401   RDW 13.5 12/28/2018 1716   LYMPHSABS 1.9 12/28/2018 1716   MONOABS 1.1 (H) 11/13/2009 0401   EOSABS 0.2 12/28/2018 1716    BASOSABS 0.0 12/28/2018 1716   Iron/TIBC/Ferritin/ %Sat    Component Value Date/Time   IRON 41 04/27/2018 1104   TIBC 350 04/27/2018 1104   FERRITIN 8 (L) 04/27/2018 1104   IRONPCTSAT 12 (L) 04/27/2018 1104   Lipid Panel     Component Value Date/Time   CHOL 181 12/28/2018 1716   TRIG 192 (H) 12/28/2018 1716   HDL 30 (L) 12/28/2018 1716   CHOLHDL 7.0 (H) 04/24/2018 1020   LDLCALC 117 (H) 12/28/2018 1716   Hepatic Function Panel     Component Value Date/Time   PROT 6.7 12/28/2018 1716   ALBUMIN 4.1 12/28/2018 1716   AST 15 12/28/2018 1716   ALT 16 12/28/2018 1716   ALKPHOS 71 12/28/2018 1716   BILITOT 0.2 12/28/2018 1716      Component Value Date/Time   TSH 2.010 12/28/2018 1716   TSH 2.420 05/17/2018 1101   TSH 2.670 04/24/2018 1020      I, Trixie Dredge, am acting as Location manager for Dennard Nip, MD I have reviewed the above documentation for accuracy and completeness, and I agree with the above. -Dennard Nip, MD

## 2019-02-08 ENCOUNTER — Ambulatory Visit (INDEPENDENT_AMBULATORY_CARE_PROVIDER_SITE_OTHER): Payer: BC Managed Care – PPO | Admitting: Family Medicine

## 2019-02-14 ENCOUNTER — Encounter (INDEPENDENT_AMBULATORY_CARE_PROVIDER_SITE_OTHER): Payer: Self-pay | Admitting: Family Medicine

## 2019-02-14 ENCOUNTER — Other Ambulatory Visit: Payer: Self-pay

## 2019-02-14 ENCOUNTER — Telehealth (INDEPENDENT_AMBULATORY_CARE_PROVIDER_SITE_OTHER): Payer: BC Managed Care – PPO | Admitting: Family Medicine

## 2019-02-14 ENCOUNTER — Encounter (INDEPENDENT_AMBULATORY_CARE_PROVIDER_SITE_OTHER): Payer: Self-pay

## 2019-02-14 DIAGNOSIS — Z6839 Body mass index (BMI) 39.0-39.9, adult: Secondary | ICD-10-CM | POA: Diagnosis not present

## 2019-02-14 DIAGNOSIS — F3289 Other specified depressive episodes: Secondary | ICD-10-CM

## 2019-02-19 NOTE — Progress Notes (Signed)
Office: [phone]  /  Fax: [phone] TeleHealth Visit:  Alexis Jones has verbally consented to this TeleHealth visit today. The patient is located at home, the provider is located at the [facility] office. The participants in this visit include the listed provider and patient. The visit was conducted today via webex.  HPI:  Chief Complaint: OBESITY Alexis Jones is here to discuss her progress with her obesity treatment plan. She is on the Category 3 plan and states she is following her eating plan approximately 75 % of the time. She states she is exercising 0 minutes 0 times per week.  Alexis Jones continues to do well with weight loss. She thinks she has lost another 1-2 lbs since her last visit. She feels she will do well with maintaining her weight over Christmas, but she has questions about healthier party snacks.  Depression with Emotional Eating Behaviors Alexis Jones feels she is doing well on her Wellbutrin, and she feels she is able to control her stress and boredom eating better. She denies insomnia.  ASSESSMENT AND PLAN:  Other depression, emotional eating  Class 2 severe obesity with serious comorbidity and body mass index (BMI) of 39.0 to 39.9 in adult, unspecified obesity type ([place])  PLAN:  Emotional Eating Behaviors (other depression) Behavior modification techniques were discussed today to help Alexis Jones deal with her emotional/non-hunger eating behaviors. Alexis Jones is to continue to take Wellbutrin as is, and will continue to follow and monitor her progress.  I spent > than 50% of the 12 minute visit on counseling as documented in the note.  Obesity Alexis Jones is currently in the action stage of change. As such, her goal is to continue with weight loss efforts She has agreed to follow the Category 3 plan Alexis Jones will work up to a goal of 150 minutes of combined cardio and strengthening exercise per week for weight loss and overall health  benefits. We discussed the following Behavioral Modification Strategies today: holiday eating strategies, better snacking choices, and emotional eating strategies   Alexis Jones has agreed to follow up with our clinic in 3 to 4 weeks. She was informed of the importance of frequent follow up visits to maximize her success with intensive lifestyle modifications for her multiple health conditions.  ALLERGIES: No Known Allergies  MEDICATIONS: Current Outpatient Medications on File Prior to Visit  Medication Sig Dispense Refill  . buPROPion (WELLBUTRIN SR) 200 MG 12 hr tablet Take 1 tablet (200 mg total) by mouth daily. 30 tablet 0  . escitalopram (LEXAPRO) 10 MG tablet Take 1 tablet (10 mg total) by mouth daily. 90 tablet 4  . ibuprofen (ADVIL,MOTRIN) 200 MG tablet Take 200 mg by mouth every 8 (eight) hours as needed.    Alexis Jones levonorgestrel (MIRENA) 20 MCG/24HR IUD 1 each by Intrauterine route once. Placed 05/05/15    . Multiple Vitamins-Minerals (WOMENS MULTI VITAMIN & MINERAL PO) Take 1 tablet by mouth daily.    Alexis Jones omeprazole (PRILOSEC) 40 MG capsule Take 1 capsule (40 mg total) by mouth daily. 90 capsule 3  . spironolactone (ALDACTONE) 100 MG tablet Take 1 tablet by mouth daily.    . Vitamin D, Ergocalciferol, (DRISDOL) 1.25 MG (50000 UT) CAPS capsule Take 1 capsule (50,000 Units total) by mouth every 7 (seven) days. 8 capsule 0   No current facility-administered medications on file prior to visit.    PAST MEDICAL HISTORY: Past Medical History:  Diagnosis Date  . Anxiety   . Asthma   . Dyspnea   . Female  bladder prolapse   . GERD (gastroesophageal reflux disease)   . Hiatal hernia   . High triglycerides   . HLD (hyperlipidemia)   . Left ovarian cyst   . Low HDL (under 40)   . Lower extremity edema   . Microcytic anemia   . PCOS (polycystic ovarian syndrome)   . Prediabetes   . Rectocele   . Urinary incontinence   . Vitamin D deficiency     PAST SURGICAL HISTORY: Past Surgical  History:  Procedure Laterality Date  . BREAST REDUCTION SURGERY  2000  . LIPOMA EXCISION  2015   left shoulder lipoma removed (2)  . TEAR DUCT PROBING  04/1979    SOCIAL HISTORY: Social History   Tobacco Use  . Smoking status: Never Smoker  . Smokeless tobacco: Never Used  Substance Use Topics  . Alcohol use: No  . Drug use: No    FAMILY HISTORY: Family History  Problem Relation Age of Onset  . Hypertension Mother   . Hyperlipidemia Mother   . Thyroid disease Mother   . Anxiety disorder Mother   . Obesity Mother   . Cancer Father        prostate cancer  . Mitral valve prolapse Father   . Obesity Father   . Breast cancer Maternal Aunt   . Ovarian cancer Maternal Grandmother   . Skin cancer Maternal Grandfather   . Lung cancer Maternal Grandfather     ROS: Review of Systems  Constitutional: Positive for weight loss.  Psychiatric/Behavioral: Positive for depression. The patient does not have insomnia.     PHYSICAL EXAM: There were no vitals taken for this visit. There is no height or weight on file to calculate BMI. Physical Exam Vitals reviewed.  Constitutional:      Appearance: Normal appearance. She is obese.  Cardiovascular:     Rate and Rhythm: Normal rate.     Pulses: Normal pulses.  Pulmonary:     Effort: Pulmonary effort is normal.     Breath sounds: Normal breath sounds.  Musculoskeletal:        General: Normal range of motion.  Skin:    General: Skin is warm and dry.  Neurological:     Mental Status: She is alert and oriented to person, place, and time.  Psychiatric:        Mood and Affect: Mood normal.        Behavior: Behavior normal.     RECENT LABS AND TESTS: BMET    Component Value Date/Time   NA 138 12/28/2018 1716   K 3.7 12/28/2018 1716   CL 102 12/28/2018 1716   CO2 19 (L) 12/28/2018 1716   GLUCOSE 147 (H) 12/28/2018 1716   GLUCOSE 94 11/13/2009 0401   BUN 9 12/28/2018 1716   CREATININE 0.85 12/28/2018 1716   CALCIUM 8.9  12/28/2018 1716   GFRNONAA 86 12/28/2018 1716   GFRAA 99 12/28/2018 1716   Lab Results  Component Value Date   HGBA1C 5.3 12/28/2018   HGBA1C 5.8 (H) 05/17/2018   HGBA1C 5.9 (H) 04/24/2018   Lab Results  Component Value Date   INSULIN 104.0 (H) 12/28/2018   INSULIN 13.0 05/17/2018   CBC    Component Value Date/Time   WBC 8.2 12/28/2018 1716   WBC 10.2 11/13/2009 0401   RBC 4.41 12/28/2018 1716   RBC 4.47 11/13/2009 0401   HGB 12.7 12/28/2018 1716   HCT 37.1 12/28/2018 1716   PLT 259 12/28/2018 1716   MCV 84  12/28/2018 1716   MCH 28.8 12/28/2018 1716   MCH 30.0 11/13/2009 0401   MCHC 34.2 12/28/2018 1716   MCHC 33.8 11/13/2009 0401   RDW 13.5 12/28/2018 1716   LYMPHSABS 1.9 12/28/2018 1716   MONOABS 1.1 (H) 11/13/2009 0401   EOSABS 0.2 12/28/2018 1716   BASOSABS 0.0 12/28/2018 1716   Iron/TIBC/Ferritin/ %Sat    Component Value Date/Time   IRON 41 04/27/2018 1104   TIBC 350 04/27/2018 1104   FERRITIN 8 (L) 04/27/2018 1104   IRONPCTSAT 12 (L) 04/27/2018 1104   Lipid Panel     Component Value Date/Time   CHOL 181 12/28/2018 1716   TRIG 192 (H) 12/28/2018 1716   HDL 30 (L) 12/28/2018 1716   CHOLHDL 7.0 (H) 04/24/2018 1020   LDLCALC 117 (H) 12/28/2018 1716   Hepatic Function Panel     Component Value Date/Time   PROT 6.7 12/28/2018 1716   ALBUMIN 4.1 12/28/2018 1716   AST 15 12/28/2018 1716   ALT 16 12/28/2018 1716   ALKPHOS 71 12/28/2018 1716   BILITOT 0.2 12/28/2018 1716      Component Value Date/Time   TSH 2.010 12/28/2018 1716   TSH 2.420 05/17/2018 1101   TSH 2.670 04/24/2018 1020        I, Trixie Dredge, am acting as Location manager for Dennard Nip, MD I have reviewed the above documentation for accuracy and completeness, and I agree with the above. -Dennard Nip, MD

## 2019-03-05 ENCOUNTER — Other Ambulatory Visit (INDEPENDENT_AMBULATORY_CARE_PROVIDER_SITE_OTHER): Payer: Self-pay | Admitting: Family Medicine

## 2019-03-05 DIAGNOSIS — F3289 Other specified depressive episodes: Secondary | ICD-10-CM

## 2019-03-07 ENCOUNTER — Other Ambulatory Visit (INDEPENDENT_AMBULATORY_CARE_PROVIDER_SITE_OTHER): Payer: Self-pay | Admitting: Family Medicine

## 2019-03-07 DIAGNOSIS — E559 Vitamin D deficiency, unspecified: Secondary | ICD-10-CM

## 2019-03-13 ENCOUNTER — Other Ambulatory Visit: Payer: Self-pay

## 2019-03-13 ENCOUNTER — Ambulatory Visit (INDEPENDENT_AMBULATORY_CARE_PROVIDER_SITE_OTHER): Payer: BC Managed Care – PPO | Admitting: Family Medicine

## 2019-03-13 ENCOUNTER — Encounter (INDEPENDENT_AMBULATORY_CARE_PROVIDER_SITE_OTHER): Payer: Self-pay | Admitting: Family Medicine

## 2019-03-13 VITALS — BP 119/80 | HR 80 | Temp 98.2°F | Ht 66.0 in | Wt 244.0 lb

## 2019-03-13 DIAGNOSIS — R7303 Prediabetes: Secondary | ICD-10-CM | POA: Diagnosis not present

## 2019-03-13 DIAGNOSIS — Z6839 Body mass index (BMI) 39.0-39.9, adult: Secondary | ICD-10-CM

## 2019-03-15 NOTE — Progress Notes (Signed)
Chief Complaint:   OBESITY Alexis Jones is here to discuss her progress with her obesity treatment plan along with follow-up of her obesity related diagnoses. Alexis Jones is on the Category 3 Plan and states she is following her eating plan approximately 50% of the time. Alexis Jones states she is doing 0 minutes 0 times per week.  Today's visit was #: 73 Starting weight: 249 lbs Starting date: 05/17/2018 Today's weight: 244 lbs Today's date: 03/13/2019 Total lbs lost to date: 5 Total lbs lost since last in-office visit: 0  Interim History: Alexis Jones has done very well maintaining her weight over the holidays. She is ready to get back to a structured plan and lose some additional weight.  Subjective:   1. Pre-diabetes Alexis Jones's A1c has improved with diet, she notes decreased polyphagia. She is doing well on her eating plan. Her insulin was elevated but she wasn't fasting.  Assessment/Plan:   1. Pre-diabetes Alexis Jones will continue to work on weight loss, diet, exercise, and decreasing simple carbohydrates to help decrease the risk of diabetes. We will recheck labs in 1 to 2 months.  2. Class 2 severe obesity with serious comorbidity and body mass index (BMI) of 39.0 to 39.9 in adult, unspecified obesity type ([facility]) Alexis Jones is currently in the action stage of change. As such, her goal is to continue with weight loss efforts. She has agreed to on the Category 3 Plan.   We discussed the following behavioral modification strategies today: increasing lean protein intake.  Alexis Jones has agreed to follow-up with our clinic in 2 weeks. She was informed of the importance of frequent follow-up visits to maximize her success with intensive lifestyle modifications for her multiple health conditions.   Objective:   Blood pressure 119/80, pulse 80, temperature 98.2 F (36.8 C), temperature source Oral, height 5\' 6"  (1.676 m), weight 244 lb (110.7 kg), SpO2 97 %. Body  mass index is 39.38 kg/m.  General: Cooperative, alert, well developed, in no acute distress. HEENT: Conjunctivae and lids unremarkable. Neck: No thyromegaly.  Cardiovascular: Regular rhythm.  Lungs: Normal work of breathing. Extremities: No edema.  Neurologic: No focal deficits.   Lab Results  Component Value Date   CREATININE 0.85 12/28/2018   BUN 9 12/28/2018   NA 138 12/28/2018   K 3.7 12/28/2018   CL 102 12/28/2018   CO2 19 (L) 12/28/2018   Lab Results  Component Value Date   ALT 16 12/28/2018   AST 15 12/28/2018   ALKPHOS 71 12/28/2018   BILITOT 0.2 12/28/2018   Lab Results  Component Value Date   HGBA1C 5.3 12/28/2018   HGBA1C 5.8 (H) 05/17/2018   HGBA1C 5.9 (H) 04/24/2018   Lab Results  Component Value Date   INSULIN 104.0 (H) 12/28/2018   INSULIN 13.0 05/17/2018   Lab Results  Component Value Date   TSH 2.010 12/28/2018   Lab Results  Component Value Date   CHOL 181 12/28/2018   HDL 30 (L) 12/28/2018   LDLCALC 117 (H) 12/28/2018   TRIG 192 (H) 12/28/2018   CHOLHDL 7.0 (H) 04/24/2018   Lab Results  Component Value Date   WBC 8.2 12/28/2018   HGB 12.7 12/28/2018   HCT 37.1 12/28/2018   MCV 84 12/28/2018   PLT 259 12/28/2018   Lab Results  Component Value Date   IRON 41 04/27/2018   TIBC 350 04/27/2018   FERRITIN 8 (L) 04/27/2018   Attestation Statements:   Reviewed by clinician on day of visit: allergies,  medications, problem list, medical history, surgical history, family history, social history, and previous encounter notes.  Time spent on visit including pre-visit chart review and post-visit care and documentation was 22 minutes.   I, Trixie Dredge, am acting as transcriptionist for Dennard Nip, MD.  I have reviewed the above documentation for accuracy and completeness, and I agree with the above. -  Dennard Nip, MD

## 2019-04-02 ENCOUNTER — Encounter (INDEPENDENT_AMBULATORY_CARE_PROVIDER_SITE_OTHER): Payer: Self-pay | Admitting: Family Medicine

## 2019-04-02 ENCOUNTER — Other Ambulatory Visit: Payer: Self-pay

## 2019-04-02 ENCOUNTER — Ambulatory Visit (INDEPENDENT_AMBULATORY_CARE_PROVIDER_SITE_OTHER): Payer: BC Managed Care – PPO | Admitting: Family Medicine

## 2019-04-02 VITALS — BP 123/84 | HR 86 | Temp 98.3°F | Ht 66.0 in | Wt 243.0 lb

## 2019-04-02 DIAGNOSIS — Z6839 Body mass index (BMI) 39.0-39.9, adult: Secondary | ICD-10-CM

## 2019-04-02 DIAGNOSIS — F3289 Other specified depressive episodes: Secondary | ICD-10-CM

## 2019-04-02 DIAGNOSIS — Z9189 Other specified personal risk factors, not elsewhere classified: Secondary | ICD-10-CM

## 2019-04-02 MED ORDER — BUPROPION HCL ER (SR) 200 MG PO TB12: 200.0000 mg | ORAL_TABLET | Freq: Every day | ORAL | 0 refills | Status: DC

## 2019-04-03 NOTE — Progress Notes (Signed)
Chief Complaint:   OBESITY Alexis Jones is here to discuss her progress with her obesity treatment plan along with follow-up of her obesity related diagnoses. Alexis Jones is on the Category 3 Plan and states she is following her eating plan approximately 80% of the time. Alexis Jones states she is doing 0 minutes 0 times per week.  Today's visit was #: 18 Starting weight: 249 lbs Starting date: 05/17/2018 Today's weight: 243 lbs Today's date: 04/02/2019 Total lbs lost to date: 6 Total lbs lost since last in-office visit: 1  Interim History: Alexis Jones continues to do well with weight loss on her Category 3 plan. She is doing better with breakfast and snacks, and she is working on meal planning and getting adequate sleep.  Subjective:   1. Other depression, with emotional eating S[place]ette is stable on Wellbutrin, and she is sleeping well overall and her blood pressure continues to stay well controlled.  2. At risk for hypertension The patient is at a higher than average risk of hypertension due to obesity and stress.  Assessment/Plan:   1. Other depression, with emotional eating Behavior modification techniques were discussed today to help Alexis Jones deal with her emotional/non-hunger eating behaviors. We will refill Wellbutrin for 1 month. Orders and follow up as documented in patient record.   - buPROPion (WELLBUTRIN SR) 200 MG 12 hr tablet; Take 1 tablet (200 mg total) by mouth daily.  Dispense: 30 tablet; Refill: 0  2. At risk for hypertension Alexis Jones was given approximately 15 minutes of hypertension prevention counseling today. Alexis Jones is at risk for hypertension due to obesity. We discussed intensive lifestyle modifications today with an emphasis on weight loss as well as increasing exercise and decreasing salt intake.  Repetitive spaced learning was employed today to elicit superior memory formation and behavioral change.  3. Class 2 severe obesity with serious  comorbidity and body mass index (BMI) of 39.0 to 39.9 in adult, unspecified obesity type ([facility]) Alexis Jones is currently in the action stage of change. As such, her goal is to continue with weight loss efforts. She has agreed to the Category 3 Plan.   Exercise goals: No exercise has been prescribed at this time.  Behavioral modification strategies: increasing lean protein intake and meal planning and cooking strategies.  Alexis Jones has agreed to follow-up with our clinic in 3 weeks. She was informed of the importance of frequent follow-up visits to maximize her success with intensive lifestyle modifications for her multiple health conditions.   Objective:   Blood pressure 123/84, pulse 86, temperature 98.3 F (36.8 C), temperature source Oral, height 5\' 6"  (1.676 m), weight 243 lb (110.2 kg), SpO2 97 %. Body mass index is 39.22 kg/m.  General: Cooperative, alert, well developed, in no acute distress. HEENT: Conjunctivae and lids unremarkable. Cardiovascular: Regular rhythm.  Lungs: Normal work of breathing. Neurologic: No focal deficits.   Lab Results  Component Value Date   CREATININE 0.85 12/28/2018   BUN 9 12/28/2018   NA 138 12/28/2018   K 3.7 12/28/2018   CL 102 12/28/2018   CO2 19 (L) 12/28/2018   Lab Results  Component Value Date   ALT 16 12/28/2018   AST 15 12/28/2018   ALKPHOS 71 12/28/2018   BILITOT 0.2 12/28/2018   Lab Results  Component Value Date   HGBA1C 5.3 12/28/2018   HGBA1C 5.8 (H) 05/17/2018   HGBA1C 5.9 (H) 04/24/2018   Lab Results  Component Value Date   INSULIN 104.0 (H) 12/28/2018   INSULIN 13.0 05/17/2018  Lab Results  Component Value Date   TSH 2.010 12/28/2018   Lab Results  Component Value Date   CHOL 181 12/28/2018   HDL 30 (L) 12/28/2018   LDLCALC 117 (H) 12/28/2018   TRIG 192 (H) 12/28/2018   CHOLHDL 7.0 (H) 04/24/2018   Lab Results  Component Value Date   WBC 8.2 12/28/2018   HGB 12.7 12/28/2018   HCT 37.1 12/28/2018   MCV  84 12/28/2018   PLT 259 12/28/2018   Lab Results  Component Value Date   IRON 41 04/27/2018   TIBC 350 04/27/2018   FERRITIN 8 (L) 04/27/2018   Attestation Statements:   Reviewed by clinician on day of visit: allergies, medications, problem list, medical history, surgical history, family history, social history, and previous encounter notes.   I, Trixie Dredge, am acting as transcriptionist for Dennard Nip, MD.  I have reviewed the above documentation for accuracy and completeness, and I agree with the above. -  Dennard Nip, MD

## 2019-04-28 ENCOUNTER — Other Ambulatory Visit (INDEPENDENT_AMBULATORY_CARE_PROVIDER_SITE_OTHER): Payer: Self-pay | Admitting: Family Medicine

## 2019-04-28 DIAGNOSIS — E559 Vitamin D deficiency, unspecified: Secondary | ICD-10-CM

## 2019-04-30 ENCOUNTER — Ambulatory Visit (INDEPENDENT_AMBULATORY_CARE_PROVIDER_SITE_OTHER): Payer: BC Managed Care – PPO | Admitting: Family Medicine

## 2019-05-09 ENCOUNTER — Other Ambulatory Visit: Payer: Self-pay

## 2019-05-09 ENCOUNTER — Encounter (INDEPENDENT_AMBULATORY_CARE_PROVIDER_SITE_OTHER): Payer: Self-pay | Admitting: Physician Assistant

## 2019-05-09 ENCOUNTER — Ambulatory Visit (INDEPENDENT_AMBULATORY_CARE_PROVIDER_SITE_OTHER): Payer: BC Managed Care – PPO | Admitting: Physician Assistant

## 2019-05-09 VITALS — BP 124/83 | HR 82 | Temp 98.5°F | Ht 66.0 in | Wt 245.0 lb

## 2019-05-09 DIAGNOSIS — R7303 Prediabetes: Secondary | ICD-10-CM

## 2019-05-09 DIAGNOSIS — E559 Vitamin D deficiency, unspecified: Secondary | ICD-10-CM | POA: Diagnosis not present

## 2019-05-09 DIAGNOSIS — E7849 Other hyperlipidemia: Secondary | ICD-10-CM

## 2019-05-09 DIAGNOSIS — Z9189 Other specified personal risk factors, not elsewhere classified: Secondary | ICD-10-CM

## 2019-05-09 DIAGNOSIS — Z6839 Body mass index (BMI) 39.0-39.9, adult: Secondary | ICD-10-CM

## 2019-05-09 NOTE — Progress Notes (Signed)
Chief Complaint:   OBESITY Alexis Jones is here to discuss her progress with her obesity treatment plan along with follow-up of her obesity related diagnoses. Alexis Jones is on the Category 3 Plan and states she is following her eating plan approximately 40% of the time. Alexis Jones states she is exercising 0 minutes 0 times per week.  Today's visit was #: 63 Starting weight: 249 lbs Starting date: 05/17/2018 Today's weight: 245 lbs Today's date: 05/09/2019 Total lbs lost to date: 4 Total lbs lost since last in-office visit: 0  Interim History: Alexis Jones reports that she is having trouble finding the time to meal plan with her busy schedule. She has 2 kids that are doing virtual learning and she has multiple jobs.  Subjective:   Vitamin D deficiency. Alexis Jones is on Vitamin D weekly. No nausea, vomiting, or muscle weakness. Last Vitamin D 41.0 on 12/28/2018. She is due for labs.  Prediabetes. Alexis Jones has a diagnosis of prediabetes based on her elevated HgA1c and was informed this puts her at greater risk of developing diabetes. She continues to work on diet and exercise to decrease her risk of diabetes. She denies nausea or hypoglycemia. Alexis Jones is on no medications. No polyphagia. She is due for labs.  Lab Results  Component Value Date   HGBA1C 5.3 12/28/2018   Lab Results  Component Value Date   INSULIN 104.0 (H) 12/28/2018   INSULIN 13.0 05/17/2018   Other hyperlipidemia. Alexis Jones is on no medications. No chest pain. She is not exercising. She is due for labs.  Lab Results  Component Value Date   CHOL 181 12/28/2018   HDL 30 (L) 12/28/2018   LDLCALC 117 (H) 12/28/2018   TRIG 192 (H) 12/28/2018   CHOLHDL 7.0 (H) 04/24/2018   Lab Results  Component Value Date   ALT 16 12/28/2018   AST 15 12/28/2018   ALKPHOS 71 12/28/2018   BILITOT 0.2 12/28/2018   The 10-year ASCVD risk score Mikey Bussing DC Jr., et al., 2013) is: 1.5%   Values used to calculate the  score:     Age: 39 years     Sex: Female     Is Non-Hispanic African American: No     Diabetic: No     Tobacco smoker: No     Systolic Blood Pressure: [id number] mmHg     Is BP treated: No     HDL Cholesterol: 30 mg/dL     Total Cholesterol: 181 mg/dL  At risk for osteoporosis. Alexis Jones is at higher risk of osteopenia and osteoporosis due to Vitamin D deficiency.   Assessment/Plan:   Vitamin D deficiency. Low Vitamin D level contributes to fatigue and are associated with obesity, breast, and colon cancer. She agrees to continue to take Vitamin D and VITAMIN D 25 Hydroxy (Vit-D Deficiency, Fractures) level ordered.  Prediabetes. Alexis Jones will continue to work on weight loss, exercise, and decreasing simple carbohydrates to help decrease the risk of diabetes. Comprehensive metabolic panel, Hemoglobin A1c, Insulin, random labs ordered.  Other hyperlipidemia. Cardiovascular risk and specific lipid/LDL goals reviewed.  We discussed several lifestyle modifications today and Alexis Jones will continue to work on diet, exercise and weight loss efforts. Orders and follow up as documented in patient record. Comprehensive metabolic panel, Lipid Panel With LDL/HDL Ratio labs ordered.  Counseling Intensive lifestyle modifications are the first line treatment for this issue. . Dietary changes: Increase soluble fiber. Decrease simple carbohydrates. . Exercise changes: Moderate to vigorous-intensity aerobic activity 150 minutes per week if tolerated. Alexis Jones Kitchen  Lipid-lowering medications: see documented in medical record.    At risk for osteoporosis. Alexis Jones was given approximately 15 minutes of osteoporosis prevention counseling today. Alexis Jones is at risk for osteopenia and osteoporosis due to her Vitamin D deficiency. She was encouraged to take her Vitamin D and follow her higher calcium diet and increase strengthening exercise to help strengthen her bones and decrease her risk of osteopenia and  osteoporosis.  Repetitive spaced learning was employed today to elicit superior memory formation and behavioral change.  Class 2 severe obesity with serious comorbidity and body mass index (BMI) of 39.0 to 39.9 in adult, unspecified obesity type (Alexis Jones).  Alexis Jones is currently in the action stage of change. As such, her goal is to continue with weight loss efforts. She has agreed to keeping a food journal and adhering to recommended goals of 1500 calories and 95 grams of protein daily.   Exercise goals: For substantial health benefits, adults should do at least 150 minutes (2 hours and 30 minutes) a week of moderate-intensity, or 75 minutes (1 hour and 15 minutes) a week of vigorous-intensity aerobic physical activity, or an equivalent combination of moderate- and vigorous-intensity aerobic activity. Aerobic activity should be performed in episodes of at least 10 minutes, and preferably, it should be spread throughout the week.  Behavioral modification strategies: increasing lean protein intake and meal planning and cooking strategies.  Alexis Jones has agreed to follow-up with our clinic in 2-3 weeks. She was informed of the importance of frequent follow-up visits to maximize her success with intensive lifestyle modifications for her multiple health conditions.   Alexis Jones was informed we would discuss her lab results at her next visit unless there is a critical issue that needs to be addressed sooner. Alexis Jones agreed to keep her next visit at the agreed upon time to discuss these results.  Objective:   Blood pressure 124/83, pulse 82, temperature 98.5 F (36.9 C), temperature source Oral, height 5\' 6"  (1.676 m), weight 245 lb (111.1 kg), SpO2 95 %. Body mass index is 39.54 kg/m.  General: Cooperative, alert, well developed, in no acute distress. HEENT: Conjunctivae and lids unremarkable. Cardiovascular: Regular rhythm.  Lungs: Normal work of breathing. Neurologic: No focal deficits.   Lab  Results  Component Value Date   CREATININE 0.85 12/28/2018   BUN 9 12/28/2018   NA 138 12/28/2018   K 3.7 12/28/2018   CL 102 12/28/2018   CO2 19 (L) 12/28/2018   Lab Results  Component Value Date   ALT 16 12/28/2018   AST 15 12/28/2018   ALKPHOS 71 12/28/2018   BILITOT 0.2 12/28/2018   Lab Results  Component Value Date   HGBA1C 5.3 12/28/2018   HGBA1C 5.8 (H) 05/17/2018   HGBA1C 5.9 (H) 04/24/2018   Lab Results  Component Value Date   INSULIN 104.0 (H) 12/28/2018   INSULIN 13.0 05/17/2018   Lab Results  Component Value Date   TSH 2.010 12/28/2018   Lab Results  Component Value Date   CHOL 181 12/28/2018   HDL 30 (L) 12/28/2018   LDLCALC 117 (H) 12/28/2018   TRIG 192 (H) 12/28/2018   CHOLHDL 7.0 (H) 04/24/2018   Lab Results  Component Value Date   WBC 8.2 12/28/2018   HGB 12.7 12/28/2018   HCT 37.1 12/28/2018   MCV 84 12/28/2018   PLT 259 12/28/2018   Lab Results  Component Value Date   IRON 41 04/27/2018   TIBC 350 04/27/2018   FERRITIN 8 (L) 04/27/2018   Attestation  Statements:   Reviewed by clinician on day of visit: allergies, medications, problem list, medical history, surgical history, family history, social history, and previous encounter notes.  IMichaelene Song, am acting as transcriptionist for Abby Potash, PA-C   I have reviewed the above documentation for accuracy and completeness, and I agree with the above. Abby Potash, PA-C

## 2019-05-10 ENCOUNTER — Other Ambulatory Visit: Payer: Self-pay | Admitting: Obstetrics & Gynecology

## 2019-05-10 DIAGNOSIS — K219 Gastro-esophageal reflux disease without esophagitis: Secondary | ICD-10-CM

## 2019-05-10 LAB — LIPID PANEL WITH LDL/HDL RATIO
Cholesterol, Total: 197 mg/dL (ref 100–199)
HDL: 31 mg/dL — ABNORMAL LOW (ref >39)
LDL Chol Calc (NIH): 129 mg/dL — ABNORMAL HIGH (ref 0–99)
LDL/HDL Ratio: 4.2 ratio — ABNORMAL HIGH (ref 0.0–3.2)
Triglycerides: 205 mg/dL — ABNORMAL HIGH (ref 0–149)
VLDL Cholesterol Cal: 37 mg/dL (ref 5–40)

## 2019-05-10 LAB — HEMOGLOBIN A1C
Est. average glucose Bld gHb Est-mCnc: 111 mg/dL
Hgb A1c MFr Bld: 5.5 % (ref 4.8–5.6)

## 2019-05-10 LAB — COMPREHENSIVE METABOLIC PANEL
ALT: 25 IU/L (ref 0–32)
AST: 16 IU/L (ref 0–40)
Albumin/Globulin Ratio: 1.6 (ref 1.2–2.2)
Albumin: 4.4 g/dL (ref 3.8–4.8)
Alkaline Phosphatase: 68 IU/L (ref 39–117)
BUN/Creatinine Ratio: 14 (ref 9–23)
BUN: 12 mg/dL (ref 6–24)
Bilirubin Total: 0.2 mg/dL (ref 0.0–1.2)
CO2: 24 mmol/L (ref 20–29)
Calcium: 9.3 mg/dL (ref 8.7–10.2)
Chloride: 101 mmol/L (ref 96–106)
Creatinine, Ser: 0.85 mg/dL (ref 0.57–1.00)
GFR calc Af Amer: 98 mL/min/{1.73_m2} (ref >59)
GFR calc non Af Amer: 85 mL/min/{1.73_m2} (ref >59)
Globulin, Total: 2.7 g/dL (ref 1.5–4.5)
Glucose: 95 mg/dL (ref 65–99)
Potassium: 4.1 mmol/L (ref 3.5–5.2)
Sodium: 139 mmol/L (ref 134–144)
Total Protein: 7.1 g/dL (ref 6.0–8.5)

## 2019-05-10 LAB — VITAMIN D 25 HYDROXY (VIT D DEFICIENCY, FRACTURES): Vit D, 25-Hydroxy: 48.7 ng/mL (ref 30.0–100.0)

## 2019-05-10 LAB — INSULIN, RANDOM: INSULIN: 17.8 u[IU]/mL (ref 2.6–24.9)

## 2019-05-10 NOTE — Telephone Encounter (Signed)
Med refill request: Prilosec Last AEX: 04/24/2018 Next AEX: 05/28/2019 cancelled due to Dr Sabra Heck schedule. Not rescheduled yet.  Last MMG (if hormonal med) n/a Refill authorized: Pended if approved.

## 2019-05-11 ENCOUNTER — Other Ambulatory Visit: Payer: Self-pay | Admitting: Obstetrics & Gynecology

## 2019-05-11 NOTE — Telephone Encounter (Signed)
Please call pt to schedule AEX.  Thanks.

## 2019-05-11 NOTE — Telephone Encounter (Signed)
Spoke with patient. AEX scheduled for 05/21/19 at 11am with Dr. Sabra Heck. Patient notified of Rx.   Encounter closed.

## 2019-05-11 NOTE — Telephone Encounter (Signed)
Medication refill request: escitalopram 10mg  Last AEX:  04-24-2018 Next AEX: not scheduled Last MMG (if hormonal medication request): n/a Refill authorized: left message for patient to call to schedule aex. Also pharmacy note placed for patient to call to schedule aex. Please approve if appropriate

## 2019-05-14 ENCOUNTER — Other Ambulatory Visit (INDEPENDENT_AMBULATORY_CARE_PROVIDER_SITE_OTHER): Payer: Self-pay | Admitting: Family Medicine

## 2019-05-14 DIAGNOSIS — F3289 Other specified depressive episodes: Secondary | ICD-10-CM

## 2019-05-21 ENCOUNTER — Other Ambulatory Visit: Payer: Self-pay

## 2019-05-21 ENCOUNTER — Encounter: Payer: Self-pay | Admitting: Obstetrics & Gynecology

## 2019-05-21 ENCOUNTER — Ambulatory Visit: Payer: BC Managed Care – PPO | Admitting: Obstetrics & Gynecology

## 2019-05-21 ENCOUNTER — Other Ambulatory Visit (HOSPITAL_COMMUNITY)
Admission: RE | Admit: 2019-05-21 | Discharge: 2019-05-21 | Disposition: A | Discharge status: Home / Self Care | Payer: BC Managed Care – PPO | Source: Ambulatory Visit | Attending: Obstetrics & Gynecology | Admitting: Obstetrics & Gynecology

## 2019-05-21 VITALS — BP 112/68 | HR 80 | Temp 97.9°F | Ht 66.0 in | Wt 247.0 lb

## 2019-05-21 DIAGNOSIS — Z124 Encounter for screening for malignant neoplasm of cervix: Secondary | ICD-10-CM | POA: Insufficient documentation

## 2019-05-21 DIAGNOSIS — Z01419 Encounter for gynecological examination (general) (routine) without abnormal findings: Secondary | ICD-10-CM | POA: Diagnosis not present

## 2019-05-21 NOTE — Patient Instructions (Signed)
[email or web address]

## 2019-05-21 NOTE — Progress Notes (Signed)
39 y.o. [id number] Married White or Caucasian female here for annual exam.  Doing well.  Still going to [facility].  Has follow up scheduled with Dr. Leafy Ro.  Started Bupropion last year to see if this would help.  Had blood work   Denies vaginal bleeding.  Has Mirena IUD that was placed 05/05/2015.  Newer 6 year indication for IUD discussed.  .    No LMP recorded. (Menstrual status: IUD).          Sexually active: Yes.    The current method of family planning is Mirena inserted 05/05/15.    Exercising: No.  The patient does not participate in regular exercise at present. Smoker:  no  Health Maintenance: Pap:   04/24/18 Neg  04/27/16 neg. HR HPV:neg  History of abnormal Pap:  no MMG:  never TDaP:  2020 Screening Labs: patient had labs done 2 weeks ago through [facility].   reports that she has never smoked. She has never used smokeless tobacco. She reports that she does not drink alcohol or use drugs.  Past Medical History:  Diagnosis Date  . Anxiety   . Asthma   . Dyspnea   . Female bladder prolapse   . GERD (gastroesophageal reflux disease)   . Hiatal hernia   . High triglycerides   . HLD (hyperlipidemia)   . Left ovarian cyst   . Low HDL (under 40)   . Lower extremity edema   . Microcytic anemia   . PCOS (polycystic ovarian syndrome)   . Prediabetes   . Rectocele   . Urinary incontinence   . Vitamin D deficiency     Past Surgical History:  Procedure Laterality Date  . BREAST REDUCTION SURGERY  2000  . LIPOMA EXCISION  2015   left shoulder lipoma removed (2)  . TEAR DUCT PROBING  04/1979    Current Outpatient Medications  Medication Sig Dispense Refill  . buPROPion (WELLBUTRIN SR) 200 MG 12 hr tablet Take 1 tablet (200 mg total) by mouth daily. 30 tablet 0  . escitalopram (LEXAPRO) 10 MG tablet TAKE 1 TABLET BY MOUTH EVERY DAY 30 tablet 0  . ibuprofen (ADVIL,MOTRIN) 200 MG tablet Take 200 mg by mouth every 8 (eight) hours as needed.     [name] levonorgestrel (MIRENA) 20 MCG/24HR IUD 1 each by Intrauterine route once. Placed 05/05/15    . Multiple Vitamins-Minerals (WOMENS MULTI VITAMIN & MINERAL PO) Take 1 tablet by mouth daily.    [name] omeprazole (PRILOSEC) 40 MG capsule TAKE 1 CAPSULE BY MOUTH EVERY DAY 90 capsule 0  . spironolactone (ALDACTONE) 100 MG tablet Take 1 tablet by mouth daily.    . Vitamin D, Ergocalciferol, (DRISDOL) 1.25 MG (50000 UT) CAPS capsule Take 1 capsule (50,000 Units total) by mouth every 7 (seven) days. 8 capsule 0   No current facility-administered medications for this visit.    Family History  Problem Relation Age of Onset  . Hypertension Mother   . Hyperlipidemia Mother   . Thyroid disease Mother   . Anxiety disorder Mother   . Obesity Mother   . Cancer Father        prostate cancer  . Mitral valve prolapse Father   . Obesity Father   . Breast cancer Maternal Aunt   . Ovarian cancer Maternal Grandmother   . Skin cancer Maternal Grandfather   . Lung cancer Maternal Grandfather     Review of Systems  All other systems reviewed and are negative.  Exam:   BP 112/68 (BP Location: Right Arm, Patient Position: Sitting, Cuff Size: Large)   Pulse 80   Temp 97.9 F (36.6 C) (Temporal)   Ht 5\' 6"  (1.676 m)   Wt 247 lb (112 kg)   BMI 39.87 kg/m   Height: 5\' 6"  (167.6 cm)  Ht Readings from Last 3 Encounters:  05/21/19 5\' 6"  (1.676 m)  05/09/19 5\' 6"  (1.676 m)  04/02/19 5\' 6"  (1.676 m)    General appearance: alert, cooperative and appears stated age Head: Normocephalic, without obvious abnormality, atraumatic Neck: no adenopathy, supple, symmetrical, trachea midline and thyroid normal to inspection and palpation Lungs: clear to auscultation bilaterally Breasts: normal appearance, no masses or tenderness Heart: regular rate and rhythm Abdomen: soft, non-tender; bowel sounds normal; no masses,  no organomegaly Extremities: extremities normal, atraumatic, no cyanosis or edema Skin: Skin  color, texture, turgor normal. No rashes or lesions Lymph nodes: Cervical, supraclavicular, and axillary nodes normal. No abnormal inguinal nodes palpated Neurologic: Grossly normal   Pelvic: External genitalia:  no lesions              Urethra:  normal appearing urethra with no masses, tenderness or lesions              Bartholins and Skenes: normal                 Vagina: normal appearing vagina with normal color and discharge, no lesions              Cervix: no lesions              Pap taken: Yes.   Bimanual Exam:  Uterus:  normal size, contour, position, consistency, mobility, non-tender              Adnexa: normal adnexa and no mass, fullness, tenderness               Rectovaginal: Confirms               Anus:  normal sphincter tone, no lesions  Chaperone, Terence Lux, CMA, was present for exam.  A:  Well Woman with normal exam H/o PCOS Second degree cystocele H/O hirsutism on spironolactone H/o ovarian cancer in MGM (mother had negative genetic testing) Obesity  P:   Mammogram guidelines reviewed.  She will plan to schedule this year. pap smear with HR HPV obtained today return annually or prn Will need RFs for Lexapro and Omeprazole in 3 months.  Pharmacy will let us know when needed. Lab work recently done with Dr. Leafy Ro Return 1 year or prn

## 2019-05-22 LAB — CYTOLOGY - PAP
Adequacy: ABSENT
Comment: NEGATIVE
Diagnosis: NEGATIVE
High risk HPV: NEGATIVE

## 2019-05-28 ENCOUNTER — Ambulatory Visit: Payer: BC Managed Care – PPO | Admitting: Obstetrics & Gynecology

## 2019-05-30 ENCOUNTER — Encounter (INDEPENDENT_AMBULATORY_CARE_PROVIDER_SITE_OTHER): Payer: Self-pay | Admitting: Family Medicine

## 2019-05-30 ENCOUNTER — Other Ambulatory Visit: Payer: Self-pay

## 2019-05-30 ENCOUNTER — Ambulatory Visit (INDEPENDENT_AMBULATORY_CARE_PROVIDER_SITE_OTHER): Payer: BC Managed Care – PPO | Admitting: Family Medicine

## 2019-05-30 VITALS — BP 121/82 | HR 86 | Temp 98.1°F | Ht 66.0 in | Wt 243.0 lb

## 2019-05-30 DIAGNOSIS — F3289 Other specified depressive episodes: Secondary | ICD-10-CM | POA: Diagnosis not present

## 2019-05-30 DIAGNOSIS — Z6839 Body mass index (BMI) 39.0-39.9, adult: Secondary | ICD-10-CM

## 2019-05-30 DIAGNOSIS — E559 Vitamin D deficiency, unspecified: Secondary | ICD-10-CM | POA: Diagnosis not present

## 2019-05-30 DIAGNOSIS — Z9189 Other specified personal risk factors, not elsewhere classified: Secondary | ICD-10-CM

## 2019-05-30 MED ORDER — BUPROPION HCL ER (SR) 150 MG PO TB12: 150.0000 mg | ORAL_TABLET | Freq: Two times a day (BID) | ORAL | 0 refills | Status: DC

## 2019-05-30 MED ORDER — VITAMIN D (ERGOCALCIFEROL) 1.25 MG (50000 UNIT) PO CAPS: 50000.0000 [IU] | ORAL_CAPSULE | ORAL | 0 refills | Status: DC

## 2019-05-30 NOTE — Progress Notes (Signed)
Chief Complaint:   OBESITY Alexis Jones is here to discuss her progress with her obesity treatment plan along with follow-up of her obesity related diagnoses. Alexis Jones is on the Category 3 Plan daily and states she is following her eating plan approximately 75% of the time. Alexis Jones states she is doing 0 minutes 0 times per week.  Today's visit was #: 20 Starting weight: 249 lbs Starting date: 05/17/2018 Today's weight: 243 lbs Today's date: 05/30/2019 Total lbs lost to date: 6 Total lbs lost since last in-office visit: 2  Interim History: Alexis Jones continues to do well with weight loss on her Category 3 plan. Her hunger is mostly controlled and she is doing better with decreasing emotional eating.  Subjective:   1. Vitamin D deficiency Alexis Jones is stable on Vit D. Her level is improving, but not yet at goal. She denies nausea, vomiting, or muscle weakness.  2. Other depression, with emotional eating Alexis Jones increased her Wellbutrin to a 200 mg dose and 1/2 150 mg dose (both SR) in the morning and felt she did better with decreasing emotional eating. She denies insomnia or jitteriness noted.  3. At risk for side effect of medication Anhthu is at risk for drug side effects due to increase dose of Wellbutrin.  Assessment/Plan:   1. Vitamin D deficiency Low Vitamin D level contributes to fatigue and are associated with obesity, breast, and colon cancer. We will refill prescription Vitamin D for 1 month. Alexis Jones will follow-up for routine testing of Vitamin D, at least 2-3 times per year to avoid over-replacement.  - Vitamin D, Ergocalciferol, (DRISDOL) 1.25 MG (50000 UNIT) CAPS capsule; Take 1 capsule (50,000 Units total) by mouth every 7 (seven) days.  Dispense: 8 capsule; Refill: 0  2. Other depression, with emotional eating Behavior modification techniques were discussed today to help Alexis Jones deal with her emotional/non-hunger eating behaviors. Alexis Jones agreed to  change Wellbutrin SR to 150 mg BID with no refills. Orders and follow up as documented in patient record.   - buPROPion (WELLBUTRIN SR) 150 MG 12 hr tablet; Take 1 tablet (150 mg total) by mouth 2 (two) times daily.  Dispense: 60 tablet; Refill: 0  3. At risk for side effect of medication Alexis Jones was given approximately 15 minutes of drug side effect counseling today. She is at risk for drug side effects due to increased dose of Wellbutrin, and she is to watch for feelings of jittery or insomnia. We discussed side effect possibility and risk versus benefits. Alexis Jones agreed to the medication and will contact this office if these side effects are intolerable.  Repetitive spaced learning was employed today to elicit superior memory formation and behavioral change.  4. Class 2 severe obesity with serious comorbidity and body mass index (BMI) of 39.0 to 39.9 in adult, unspecified obesity type (HCC) Alexis Jones is currently in the action stage of change. As such, her goal is to continue with weight loss efforts. She has agreed to the Category 3 Plan.   Exercise goals: All adults should avoid inactivity. Some physical activity is better than none, and adults who participate in any amount of physical activity gain some health benefits.  Behavioral modification strategies: meal planning and cooking strategies.  Alexis Jones has agreed to follow-up with our clinic in 3 weeks. She was informed of the importance of frequent follow-up visits to maximize her success with intensive lifestyle modifications for her multiple health conditions.   Objective:   Blood pressure 121/82, pulse 86, temperature 98.1 F (36.7 C),  temperature source Oral, height 5\' 6"  (1.676 m), weight 243 lb (110.2 kg), SpO2 96 %. Body mass index is 39.22 kg/m.  General: Cooperative, alert, well developed, in no acute distress. HEENT: Conjunctivae and lids unremarkable. Cardiovascular: Regular rhythm.  Lungs: Normal work of  breathing. Neurologic: No focal deficits.   Lab Results  Component Value Date   CREATININE 0.85 05/09/2019   BUN 12 05/09/2019   NA 139 05/09/2019   K 4.1 05/09/2019   CL 101 05/09/2019   CO2 24 05/09/2019   Lab Results  Component Value Date   ALT 25 05/09/2019   AST 16 05/09/2019   ALKPHOS 68 05/09/2019   BILITOT 0.2 05/09/2019   Lab Results  Component Value Date   HGBA1C 5.5 05/09/2019   HGBA1C 5.3 12/28/2018   HGBA1C 5.8 (H) 05/17/2018   HGBA1C 5.9 (H) 04/24/2018   Lab Results  Component Value Date   INSULIN 17.8 05/09/2019   INSULIN 104.0 (H) 12/28/2018   INSULIN 13.0 05/17/2018   Lab Results  Component Value Date   TSH 2.010 12/28/2018   Lab Results  Component Value Date   CHOL 197 05/09/2019   HDL 31 (L) 05/09/2019   LDLCALC 129 (H) 05/09/2019   TRIG 205 (H) 05/09/2019   CHOLHDL 7.0 (H) 04/24/2018   Lab Results  Component Value Date   WBC 8.2 12/28/2018   HGB 12.7 12/28/2018   HCT 37.1 12/28/2018   MCV 84 12/28/2018   PLT 259 12/28/2018   Lab Results  Component Value Date   IRON 41 04/27/2018   TIBC 350 04/27/2018   FERRITIN 8 (L) 04/27/2018   Attestation Statements:   Reviewed by clinician on day of visit: allergies, medications, problem list, medical history, surgical history, family history, social history, and previous encounter notes.   I, Trixie Dredge, am acting as transcriptionist for Dennard Nip, MD.  I have reviewed the above documentation for accuracy and completeness, and I agree with the above. -  Dennard Nip, MD

## 2019-06-09 ENCOUNTER — Other Ambulatory Visit: Payer: Self-pay | Admitting: Obstetrics & Gynecology

## 2019-06-11 NOTE — Telephone Encounter (Signed)
Medication refill request: Lexapro Last AEX:  05-21-19 SM  Next AEX: 08-22-20 Last MMG (if hormonal medication request): n/a Refill authorized: Today, please advise.   Pharmacy requesting 90 day supply.  Medication pended for #90, 1RF. Please refill if appropriate.

## 2019-06-15 ENCOUNTER — Other Ambulatory Visit: Payer: Self-pay | Admitting: Obstetrics & Gynecology

## 2019-06-15 DIAGNOSIS — Z1231 Encounter for screening mammogram for malignant neoplasm of breast: Secondary | ICD-10-CM

## 2019-06-25 ENCOUNTER — Other Ambulatory Visit (INDEPENDENT_AMBULATORY_CARE_PROVIDER_SITE_OTHER): Payer: Self-pay | Admitting: Family Medicine

## 2019-06-25 ENCOUNTER — Other Ambulatory Visit: Payer: Self-pay

## 2019-06-25 ENCOUNTER — Ambulatory Visit (INDEPENDENT_AMBULATORY_CARE_PROVIDER_SITE_OTHER): Payer: BC Managed Care – PPO | Admitting: Family Medicine

## 2019-06-25 VITALS — BP 119/72 | HR 77 | Temp 98.1°F | Ht 66.0 in | Wt 244.0 lb

## 2019-06-25 DIAGNOSIS — Z6839 Body mass index (BMI) 39.0-39.9, adult: Secondary | ICD-10-CM

## 2019-06-25 DIAGNOSIS — Z9189 Other specified personal risk factors, not elsewhere classified: Secondary | ICD-10-CM | POA: Diagnosis not present

## 2019-06-25 DIAGNOSIS — F3289 Other specified depressive episodes: Secondary | ICD-10-CM | POA: Diagnosis not present

## 2019-06-25 DIAGNOSIS — E559 Vitamin D deficiency, unspecified: Secondary | ICD-10-CM

## 2019-06-25 MED ORDER — BUPROPION HCL ER (SR) 150 MG PO TB12: 150.0000 mg | ORAL_TABLET | Freq: Two times a day (BID) | ORAL | 0 refills | Status: DC

## 2019-06-27 ENCOUNTER — Ambulatory Visit
Admission: RE | Admit: 2019-06-27 | Discharge: 2019-06-27 | Disposition: A | Discharge status: Home / Self Care | Payer: BC Managed Care – PPO | Source: Ambulatory Visit

## 2019-06-27 ENCOUNTER — Other Ambulatory Visit: Payer: Self-pay

## 2019-06-27 DIAGNOSIS — Z1231 Encounter for screening mammogram for malignant neoplasm of breast: Secondary | ICD-10-CM

## 2019-06-27 NOTE — Progress Notes (Signed)
Chief Complaint:   OBESITY Alexis Jones is here to discuss her progress with her obesity treatment plan along with follow-up of her obesity related diagnoses. Alexis Jones is on the Category 3 Plan and states she is following her eating plan approximately 85% of the time. Alexis Jones states she is doing 0 minutes 0 times per week.  Today's visit was #: 21 Starting weight: 249 lbs Starting date: 05/17/2018 Today's weight: 244 lbs Today's date: 06/25/2019 Total lbs lost to date: 5 Total lbs lost since last in-office visit: 0  Interim History: Alexis Jones has done well following her Category 3 plan and has journaled as well. Her calories mostly range around 1500-1600 daily and protein is usually 80+ grams daily. She is up 1 lb and I suspect her RMR has decreased.  Subjective:   1. Other depression, with emotional eating Alexis Jones is stable on Wellbutrin, and her blood pressure is controlled. She denies insomnia.  2. At risk for impaired metabolic function Alexis Jones is at increased risk for impaired metabolic function due to decreased RMR.  Assessment/Plan:   1. Other depression, with emotional eating Behavior modification techniques were discussed today to help Alexis Jones deal with her emotional/non-hunger eating behaviors. Alexis Jones will continue Lexapro as is, and we will refill Wellbutrin SR for 1 month. Orders and follow up as documented in patient record.   - buPROPion (WELLBUTRIN SR) 150 MG 12 hr tablet; Take 1 tablet (150 mg total) by mouth 2 (two) times daily.  Dispense: 60 tablet; Refill: 0  2. At risk for impaired metabolic function Alexis Jones was given approximately 15 minutes of impaired  metabolic function prevention counseling today. She is at risk for impaired metabolic function due to decreased RMR, and we will recheck IC at her next visit when she is fasting. We discussed intensive lifestyle modifications today with an emphasis on specific nutrition and exercise instructions  and strategies.   Repetitive spaced learning was employed today to elicit superior memory formation and behavioral change.  3. Class 2 severe obesity with serious comorbidity and body mass index (BMI) of 39.0 to 39.9 in adult, unspecified obesity type (HCC) Alexis Jones is currently in the action stage of change. As such, her goal is to continue with weight loss efforts. She has agreed to the Category 3 Plan.   Alexis Jones is to continue her plan as is and we will recheck IC fasting at her next visit.  Behavioral modification strategies: increasing lean protein intake and no skipping meals.  Alexis Jones has agreed to follow-up with our clinic in 3 weeks. She was informed of the importance of frequent follow-up visits to maximize her success with intensive lifestyle modifications for her multiple health conditions.   Objective:   Blood pressure 119/72, pulse 77, temperature 98.1 F (36.7 C), temperature source Oral, height 5\' 6"  (1.676 m), weight 244 lb (110.7 kg), SpO2 98 %. Body mass index is 39.38 kg/m.  General: Cooperative, alert, well developed, in no acute distress. HEENT: Conjunctivae and lids unremarkable. Cardiovascular: Regular rhythm.  Lungs: Normal work of breathing. Neurologic: No focal deficits.   Lab Results  Component Value Date   CREATININE 0.85 05/09/2019   BUN 12 05/09/2019   NA 139 05/09/2019   K 4.1 05/09/2019   CL 101 05/09/2019   CO2 24 05/09/2019   Lab Results  Component Value Date   ALT 25 05/09/2019   AST 16 05/09/2019   ALKPHOS 68 05/09/2019   BILITOT 0.2 05/09/2019   Lab Results  Component Value Date  HGBA1C 5.5 05/09/2019   HGBA1C 5.3 12/28/2018   HGBA1C 5.8 (H) 05/17/2018   HGBA1C 5.9 (H) 04/24/2018   Lab Results  Component Value Date   INSULIN 17.8 05/09/2019   INSULIN 104.0 (H) 12/28/2018   INSULIN 13.0 05/17/2018   Lab Results  Component Value Date   TSH 2.010 12/28/2018   Lab Results  Component Value Date   CHOL 197 05/09/2019     HDL 31 (L) 05/09/2019   LDLCALC 129 (H) 05/09/2019   TRIG 205 (H) 05/09/2019   CHOLHDL 7.0 (H) 04/24/2018   Lab Results  Component Value Date   WBC 8.2 12/28/2018   HGB 12.7 12/28/2018   HCT 37.1 12/28/2018   MCV 84 12/28/2018   PLT 259 12/28/2018   Lab Results  Component Value Date   IRON 41 04/27/2018   TIBC 350 04/27/2018   FERRITIN 8 (L) 04/27/2018   Attestation Statements:   Reviewed by clinician on day of visit: allergies, medications, problem list, medical history, surgical history, family history, social history, and previous encounter notes.   I, Trixie Dredge, am acting as transcriptionist for Dennard Nip, MD.  I have reviewed the above documentation for accuracy and completeness, and I agree with the above. -  Dennard Nip, MD

## 2019-07-08 ENCOUNTER — Other Ambulatory Visit (INDEPENDENT_AMBULATORY_CARE_PROVIDER_SITE_OTHER): Payer: Self-pay | Admitting: Family Medicine

## 2019-07-08 DIAGNOSIS — F3289 Other specified depressive episodes: Secondary | ICD-10-CM

## 2019-07-16 ENCOUNTER — Encounter (INDEPENDENT_AMBULATORY_CARE_PROVIDER_SITE_OTHER): Payer: Self-pay | Admitting: Family Medicine

## 2019-07-16 ENCOUNTER — Other Ambulatory Visit: Payer: Self-pay

## 2019-07-16 ENCOUNTER — Ambulatory Visit (INDEPENDENT_AMBULATORY_CARE_PROVIDER_SITE_OTHER): Payer: BC Managed Care – PPO | Admitting: Family Medicine

## 2019-07-16 VITALS — BP 118/80 | HR 65 | Temp 97.8°F | Ht 66.0 in | Wt 246.0 lb

## 2019-07-16 DIAGNOSIS — R0602 Shortness of breath: Secondary | ICD-10-CM

## 2019-07-16 DIAGNOSIS — Z9189 Other specified personal risk factors, not elsewhere classified: Secondary | ICD-10-CM

## 2019-07-16 DIAGNOSIS — E559 Vitamin D deficiency, unspecified: Secondary | ICD-10-CM | POA: Diagnosis not present

## 2019-07-16 DIAGNOSIS — Z6839 Body mass index (BMI) 39.0-39.9, adult: Secondary | ICD-10-CM

## 2019-07-16 MED ORDER — VITAMIN D (ERGOCALCIFEROL) 1.25 MG (50000 UNIT) PO CAPS: 50000.0000 [IU] | ORAL_CAPSULE | ORAL | 0 refills | Status: DC

## 2019-07-16 NOTE — Progress Notes (Signed)
Chief Complaint:   OBESITY Alexis Jones is here to discuss her progress with her obesity treatment plan along with follow-up of her obesity related diagnoses. Alexis Jones is on the Category 3 Plan and states she is following her eating plan approximately 75% of the time. Alexis Jones states she is more active with yard work and taking the stairs.   Today's visit was #: 22 Starting weight: 249 lbs Starting date: 05/17/2018 Today's weight: 346 lbs Today's date: 07/16/2019 Total lbs lost to date: 3 Total lbs lost since last in-office visit: 0  Interim History: Amanpreet is retaining some water weight today. She has done well overall on her Category 3 plan. Her weight loss has been slowing down in general though.  Subjective:   1. Vitamin D deficiency Azaylea is stable on Vit D and she denies nausea or vomiting.  2. Shortness of breath on exertion Alexis Jones notes no worsening in shortness of breath with exertion. She is due to have her VO2 rechecked today.  3. At risk for activity intolerance Alexis Jones is at risk for exercise intolerance due to decreased RMR.  Assessment/Plan:   1. Vitamin D deficiency Low Vitamin D level contributes to fatigue and are associated with obesity, breast, and colon cancer. We will refill prescription Vitamin D for 1 month. Alexis Jones will follow-up for routine testing of Vitamin D, at least 2-3 times per year to avoid over-replacement. We will recheck labs in 1-2 months.  - Vitamin D, Ergocalciferol, (DRISDOL) 1.25 MG (50000 UNIT) CAPS capsule; Take 1 capsule (50,000 Units total) by mouth every 7 (seven) days.  Dispense: 4 capsule; Refill: 0  2. Shortness of breath on exertion Alexis Jones's VO2 has decreased resulting in decreased RMR. She is to work on increasing strengthening exercises in addition to cardio and will follow up.  3. At risk for activity intolerance Alexis Jones was given approximately 15 minutes of exercise intolerance counseling today. She is  39 y.o. female and has risk factors exercise intolerance including obesity. She was encouraged to increase lean protein and increase strengthening exercises.We discussed intensive lifestyle modifications today with an emphasis on specific weight loss instructions and strategies.   Repetitive spaced learning was employed today to elicit superior memory formation and behavioral change.  4. Class 2 severe obesity with serious comorbidity and body mass index (BMI) of 39.0 to 39.9 in adult, unspecified obesity type (HCC) Alexis Jones is currently in the action stage of change. As such, her goal is to continue with weight loss efforts. She has agreed to change to following a lower carbohydrate, vegetable and lean protein rich diet plan.   Exercise goals: As is.  Behavioral modification strategies: meal planning and cooking strategies and better snacking choices.  Alexis Jones has agreed to follow-up with our clinic in 3 weeks. She was informed of the importance of frequent follow-up visits to maximize her success with intensive lifestyle modifications for her multiple health conditions.   Objective:   Blood pressure 118/80, pulse 65, temperature 97.8 F (36.6 C), temperature source Oral, height 5\' 6"  (1.676 m), weight 246 lb (111.6 kg), SpO2 97 %. Body mass index is 39.71 kg/m.  General: Cooperative, alert, well developed, in no acute distress. HEENT: Conjunctivae and lids unremarkable. Cardiovascular: Regular rhythm.  Lungs: Normal work of breathing. Neurologic: No focal deficits.   Lab Results  Component Value Date   CREATININE 0.85 05/09/2019   BUN 12 05/09/2019   NA 139 05/09/2019   K 4.1 05/09/2019   CL 101 05/09/2019   CO2 24  05/09/2019   Lab Results  Component Value Date   ALT 25 05/09/2019   AST 16 05/09/2019   ALKPHOS 68 05/09/2019   BILITOT 0.2 05/09/2019   Lab Results  Component Value Date   HGBA1C 5.5 05/09/2019   HGBA1C 5.3 12/28/2018   HGBA1C 5.8 (H) 05/17/2018    HGBA1C 5.9 (H) 04/24/2018   Lab Results  Component Value Date   INSULIN 17.8 05/09/2019   INSULIN 104.0 (H) 12/28/2018   INSULIN 13.0 05/17/2018   Lab Results  Component Value Date   TSH 2.010 12/28/2018   Lab Results  Component Value Date   CHOL 197 05/09/2019   HDL 31 (L) 05/09/2019   LDLCALC 129 (H) 05/09/2019   TRIG 205 (H) 05/09/2019   CHOLHDL 7.0 (H) 04/24/2018   Lab Results  Component Value Date   WBC 8.2 12/28/2018   HGB 12.7 12/28/2018   HCT 37.1 12/28/2018   MCV 84 12/28/2018   PLT 259 12/28/2018   Lab Results  Component Value Date   IRON 41 04/27/2018   TIBC 350 04/27/2018   FERRITIN 8 (L) 04/27/2018   Attestation Statements:   Reviewed by clinician on day of visit: allergies, medications, problem list, medical history, surgical history, family history, social history, and previous encounter notes.   I, Trixie Dredge, am acting as transcriptionist for Dennard Nip, MD.  I have reviewed the above documentation for accuracy and completeness, and I agree with the above. -  Dennard Nip, MD

## 2019-08-04 ENCOUNTER — Other Ambulatory Visit (INDEPENDENT_AMBULATORY_CARE_PROVIDER_SITE_OTHER): Payer: Self-pay | Admitting: Family Medicine

## 2019-08-04 DIAGNOSIS — F3289 Other specified depressive episodes: Secondary | ICD-10-CM

## 2019-08-06 ENCOUNTER — Other Ambulatory Visit: Payer: Self-pay

## 2019-08-06 ENCOUNTER — Ambulatory Visit (INDEPENDENT_AMBULATORY_CARE_PROVIDER_SITE_OTHER): Payer: BC Managed Care – PPO | Admitting: Family Medicine

## 2019-08-06 ENCOUNTER — Encounter (INDEPENDENT_AMBULATORY_CARE_PROVIDER_SITE_OTHER): Payer: Self-pay | Admitting: Family Medicine

## 2019-08-06 VITALS — BP 124/77 | HR 67 | Temp 98.3°F | Ht 66.0 in | Wt 235.0 lb

## 2019-08-06 DIAGNOSIS — E559 Vitamin D deficiency, unspecified: Secondary | ICD-10-CM

## 2019-08-06 DIAGNOSIS — F3289 Other specified depressive episodes: Secondary | ICD-10-CM

## 2019-08-06 DIAGNOSIS — Z9189 Other specified personal risk factors, not elsewhere classified: Secondary | ICD-10-CM

## 2019-08-06 DIAGNOSIS — Z6837 Body mass index (BMI) 37.0-37.9, adult: Secondary | ICD-10-CM

## 2019-08-06 MED ORDER — VITAMIN D (ERGOCALCIFEROL) 1.25 MG (50000 UNIT) PO CAPS: 50000.0000 [IU] | ORAL_CAPSULE | ORAL | 0 refills | Status: DC

## 2019-08-06 MED ORDER — BUPROPION HCL ER (SR) 150 MG PO TB12: 150.0000 mg | ORAL_TABLET | Freq: Two times a day (BID) | ORAL | 0 refills | Status: DC

## 2019-08-07 NOTE — Progress Notes (Signed)
Chief Complaint:   OBESITY Alexis Jones is here to discuss her progress with her obesity treatment plan along with follow-up of her obesity related diagnoses. Alexis Jones is on following a lower carbohydrate, vegetable and lean protein rich diet plan and states she is following her eating plan approximately 90% of the time. Alexis Jones states she is active while doing yard work and playing with the dog.  Today's visit was #: 23 Starting weight: 249 lbs Starting date: 05/17/2018 Today's weight: 235 lbs Today's date: 08/06/2019 Total lbs lost to date: 14 Total lbs lost since last in-office visit: 11  Interim History: Alexis Jones changed to the Low carbohydrate plan and has done very well with weight loss. Her hunger is controlled and she has done well limiting emotional eating.  Subjective:   1. Vitamin D deficiency Alexis Jones is stable on Vit D, and she denies nausea or vomiting.  2. Other depression, with emotional eating Alexis Jones increased her Wellbutrin at her last visit, and she feels this has helped to decreased emotional eating. Her blood pressure is stable.  3. At risk for dehydration Alexis Jones is at risk for dehydration due to Alexis Nip, Alexis Jones .   Assessment/Plan:   1. Vitamin D deficiency Low Vitamin D level contributes to fatigue and are associated with obesity, breast, and colon cancer. We will refill prescription Vitamin D for 1 month. Alexis Jones will follow-up for routine testing of Vitamin D, at least 2-3 times per year to avoid over-replacement.  - Vitamin D, Ergocalciferol, (DRISDOL) 1.25 MG (50000 UNIT) CAPS capsule; Take 1 capsule (50,000 Units total) by mouth every 7 (seven) days.  Dispense: 4 capsule; Refill: 0  2. Other depression, with emotional eating Behavior modification techniques were discussed today to help Alexis Jones deal with her emotional/non-hunger eating behaviors. Alexis Jones agreed to continue Lexapro and we will refill Wellbutrin SR for 1 month. Orders  and follow up as documented in patient record.   - buPROPion (WELLBUTRIN SR) 150 MG 12 hr tablet; Take 1 tablet (150 mg total) by mouth 2 (two) times daily.  Dispense: 60 tablet; Refill: 0  3. At risk for dehydration Alexis Jones was given approximately 15 minutes dehydration prevention counseling today. Alexis Jones is at risk for dehydration due to weight loss and current medication(s). She was encouraged to hydrate and monitor fluid status to avoid dehydration as well as weight loss plateaus.   4. Class 2 severe obesity with serious comorbidity and body mass index (BMI) of 37.0 to 37.9 in adult, unspecified obesity type (HCC) Andi is currently in the action stage of change. As such, her goal is to continue with weight loss efforts. She has agreed to following a lower carbohydrate, vegetable and lean protein rich diet plan.   Exercise goals: As is.  Behavioral modification strategies: increasing vegetables and meal planning and cooking strategies.  Alexis Jones has agreed to follow-up with our clinic in 3 weeks. She was informed of the importance of frequent follow-up visits to maximize her success with intensive lifestyle modifications for her multiple health conditions.   Objective:   Blood pressure 124/77, pulse 67, temperature 98.3 F (36.8 C), temperature source Oral, height 5\' 6"  (1.676 m), weight 235 lb (106.6 kg), SpO2 99 %. Body mass index is 37.93 kg/m.  General: Cooperative, alert, well developed, in no acute distress. HEENT: Conjunctivae and lids unremarkable. Cardiovascular: Regular rhythm.  Lungs: Normal work of breathing. Neurologic: No focal deficits.   Lab Results  Component Value Date   CREATININE 0.85 05/09/2019   BUN 12  05/09/2019   NA 139 05/09/2019   K 4.1 05/09/2019   CL 101 05/09/2019   CO2 24 05/09/2019   Lab Results  Component Value Date   ALT 25 05/09/2019   AST 16 05/09/2019   ALKPHOS 68 05/09/2019   BILITOT 0.2 05/09/2019   Lab Results    Component Value Date   HGBA1C 5.5 05/09/2019   HGBA1C 5.3 12/28/2018   HGBA1C 5.8 (H) 05/17/2018   HGBA1C 5.9 (H) 04/24/2018   Lab Results  Component Value Date   INSULIN 17.8 05/09/2019   INSULIN 104.0 (H) 12/28/2018   INSULIN 13.0 05/17/2018   Lab Results  Component Value Date   TSH 2.010 12/28/2018   Lab Results  Component Value Date   CHOL 197 05/09/2019   HDL 31 (L) 05/09/2019   LDLCALC 129 (H) 05/09/2019   TRIG 205 (H) 05/09/2019   CHOLHDL 7.0 (H) 04/24/2018   Lab Results  Component Value Date   WBC 8.2 12/28/2018   HGB 12.7 12/28/2018   HCT 37.1 12/28/2018   MCV 84 12/28/2018   PLT 259 12/28/2018   Lab Results  Component Value Date   IRON 41 04/27/2018   TIBC 350 04/27/2018   FERRITIN 8 (L) 04/27/2018   Attestation Statements:   Reviewed by clinician on day of visit: allergies, medications, problem list, medical history, surgical history, family history, social history, and previous encounter notes.   I, Trixie Dredge, am acting as transcriptionist for Alexis Nip, Alexis Jones.  I have reviewed the above documentation for accuracy and completeness, and I agree with the above. -  Alexis Nip, Alexis Jones

## 2019-08-08 ENCOUNTER — Other Ambulatory Visit: Payer: Self-pay | Admitting: Obstetrics & Gynecology

## 2019-08-08 DIAGNOSIS — K219 Gastro-esophageal reflux disease without esophagitis: Secondary | ICD-10-CM

## 2019-08-08 NOTE — Telephone Encounter (Signed)
Medication refill request: Omeprazole  Last AEX:  05-21-19 SM  Next AEX: 08-22-20 Last MMG (if hormonal medication request): 06-27-19 density B/BIRADS 1 negative  Refill authorized: Today, please advise.   Medication pended for #90, 3RF. Please refill if appropriate.

## 2019-08-27 ENCOUNTER — Other Ambulatory Visit: Payer: Self-pay

## 2019-08-27 ENCOUNTER — Ambulatory Visit (INDEPENDENT_AMBULATORY_CARE_PROVIDER_SITE_OTHER): Payer: BC Managed Care – PPO | Admitting: Family Medicine

## 2019-08-27 ENCOUNTER — Encounter (INDEPENDENT_AMBULATORY_CARE_PROVIDER_SITE_OTHER): Payer: Self-pay | Admitting: Family Medicine

## 2019-08-27 VITALS — BP 105/72 | HR 76 | Temp 98.4°F | Ht 66.0 in | Wt 237.0 lb

## 2019-08-27 DIAGNOSIS — Z6838 Body mass index (BMI) 38.0-38.9, adult: Secondary | ICD-10-CM | POA: Diagnosis not present

## 2019-08-27 DIAGNOSIS — E559 Vitamin D deficiency, unspecified: Secondary | ICD-10-CM | POA: Diagnosis not present

## 2019-08-27 MED ORDER — VITAMIN D (ERGOCALCIFEROL) 1.25 MG (50000 UNIT) PO CAPS: 50000.0000 [IU] | ORAL_CAPSULE | ORAL | 0 refills | Status: DC

## 2019-08-28 NOTE — Progress Notes (Signed)
Chief Complaint:   OBESITY Alexis Jones is here to discuss her progress with her obesity treatment plan along with follow-up of her obesity related diagnoses. Alexis Jones is on following a lower carbohydrate, vegetable and lean protein rich diet plan and states she is following her eating plan approximately 70% of the time. Alexis Jones states she is doing yard work.  Today's visit was #: 24 Starting weight: 249 lbs Starting date: 05/17/2018 Today's weight: 237 lbs Today's date: 08/27/2019 Total lbs lost to date: 12 Total lbs lost since last in-office visit: 0  Interim History: Alexis Jones had a lot of celebrations in June and struggled to follow her Low carbohydrate plan as closely. She thinks July will be a much easier month.  Subjective:   1. Vitamin D deficiency Alexis Jones is stable on Vit D, and she denies nausea, vomiting, or muscle weakness.  Assessment/Plan:   1. Vitamin D deficiency Low Vitamin D level contributes to fatigue and are associated with obesity, breast, and colon cancer. We will refill prescription Vitamin D for 1 month. Alexis Jones will follow-up for routine testing of Vitamin D, at least 2-3 times per year to avoid over-replacement. We will recheck labs in 1 month.  - Vitamin D, Ergocalciferol, (DRISDOL) 1.25 MG (50000 UNIT) CAPS capsule; Take 1 capsule (50,000 Units total) by mouth every 7 (seven) days.  Dispense: 4 capsule; Refill: 0  2. Class 2 severe obesity with serious comorbidity and body mass index (BMI) of 38.0 to 38.9 in adult, unspecified obesity type (HCC) Alexis Jones is currently in the action stage of change. As such, her goal is to continue with weight loss efforts. She has agreed to following a lower carbohydrate, vegetable and lean protein rich diet plan.   Exercise goals: As is.  Behavioral modification strategies: increasing lean protein intake and no skipping meals.  Alexis Jones has agreed to follow-up with our clinic in 2 to 3 weeks. She was informed  of the importance of frequent follow-up visits to maximize her success with intensive lifestyle modifications for her multiple health conditions.   Objective:   Blood pressure 105/72, pulse 76, temperature 98.4 F (36.9 C), temperature source Oral, height 5\' 6"  (1.676 m), weight 237 lb (107.5 kg), SpO2 97 %. Body mass index is 38.25 kg/m.  General: Cooperative, alert, well developed, in no acute distress. HEENT: Conjunctivae and lids unremarkable. Cardiovascular: Regular rhythm.  Lungs: Normal work of breathing. Neurologic: No focal deficits.   Lab Results  Component Value Date   CREATININE 0.85 05/09/2019   BUN 12 05/09/2019   NA 139 05/09/2019   K 4.1 05/09/2019   CL 101 05/09/2019   CO2 24 05/09/2019   Lab Results  Component Value Date   ALT 25 05/09/2019   AST 16 05/09/2019   ALKPHOS 68 05/09/2019   BILITOT 0.2 05/09/2019   Lab Results  Component Value Date   HGBA1C 5.5 05/09/2019   HGBA1C 5.3 12/28/2018   HGBA1C 5.8 (H) 05/17/2018   HGBA1C 5.9 (H) 04/24/2018   Lab Results  Component Value Date   INSULIN 17.8 05/09/2019   INSULIN 104.0 (H) 12/28/2018   INSULIN 13.0 05/17/2018   Lab Results  Component Value Date   TSH 2.010 12/28/2018   Lab Results  Component Value Date   CHOL 197 05/09/2019   HDL 31 (L) 05/09/2019   LDLCALC 129 (H) 05/09/2019   TRIG 205 (H) 05/09/2019   CHOLHDL 7.0 (H) 04/24/2018   Lab Results  Component Value Date   WBC 8.2 12/28/2018  HGB 12.7 12/28/2018   HCT 37.1 12/28/2018   MCV 84 12/28/2018   PLT 259 12/28/2018   Lab Results  Component Value Date   IRON 41 04/27/2018   TIBC 350 04/27/2018   FERRITIN 8 (L) 04/27/2018   Attestation Statements:   Reviewed by clinician on day of visit: allergies, medications, problem list, medical history, surgical history, family history, social history, and previous encounter notes.   I, Trixie Dredge, am acting as transcriptionist for Dennard Nip, MD.  I have reviewed the above  documentation for accuracy and completeness, and I agree with the above. -  Dennard Nip, MD

## 2019-09-02 ENCOUNTER — Other Ambulatory Visit (INDEPENDENT_AMBULATORY_CARE_PROVIDER_SITE_OTHER): Payer: Self-pay | Admitting: Family Medicine

## 2019-09-02 DIAGNOSIS — F3289 Other specified depressive episodes: Secondary | ICD-10-CM

## 2019-09-07 ENCOUNTER — Ambulatory Visit: Payer: BC Managed Care – PPO | Admitting: Obstetrics & Gynecology

## 2019-09-19 ENCOUNTER — Other Ambulatory Visit: Payer: Self-pay

## 2019-09-19 ENCOUNTER — Ambulatory Visit (INDEPENDENT_AMBULATORY_CARE_PROVIDER_SITE_OTHER): Payer: BC Managed Care – PPO | Admitting: Family Medicine

## 2019-09-19 ENCOUNTER — Encounter (INDEPENDENT_AMBULATORY_CARE_PROVIDER_SITE_OTHER): Payer: Self-pay | Admitting: Family Medicine

## 2019-09-19 VITALS — BP 103/71 | HR 73 | Temp 97.9°F | Ht 66.0 in | Wt 233.0 lb

## 2019-09-19 DIAGNOSIS — E7849 Other hyperlipidemia: Secondary | ICD-10-CM | POA: Diagnosis not present

## 2019-09-19 DIAGNOSIS — Z9189 Other specified personal risk factors, not elsewhere classified: Secondary | ICD-10-CM | POA: Diagnosis not present

## 2019-09-19 DIAGNOSIS — E559 Vitamin D deficiency, unspecified: Secondary | ICD-10-CM

## 2019-09-19 DIAGNOSIS — Z6837 Body mass index (BMI) 37.0-37.9, adult: Secondary | ICD-10-CM

## 2019-09-19 DIAGNOSIS — E8881 Metabolic syndrome: Secondary | ICD-10-CM

## 2019-09-19 MED ORDER — VITAMIN D (ERGOCALCIFEROL) 1.25 MG (50000 UNIT) PO CAPS: 50000.0000 [IU] | ORAL_CAPSULE | ORAL | 0 refills | Status: DC

## 2019-09-20 LAB — VITAMIN D 25 HYDROXY (VIT D DEFICIENCY, FRACTURES): Vit D, 25-Hydroxy: 66.2 ng/mL (ref 30.0–100.0)

## 2019-09-20 LAB — COMPREHENSIVE METABOLIC PANEL
ALT: 13 IU/L (ref 0–32)
AST: 9 IU/L (ref 0–40)
Albumin/Globulin Ratio: 1.6 (ref 1.2–2.2)
Albumin: 4 g/dL (ref 3.8–4.8)
Alkaline Phosphatase: 55 IU/L (ref 48–121)
BUN/Creatinine Ratio: 11 (ref 9–23)
BUN: 10 mg/dL (ref 6–24)
Bilirubin Total: 0.3 mg/dL (ref 0.0–1.2)
CO2: 24 mmol/L (ref 20–29)
Calcium: 8.7 mg/dL (ref 8.7–10.2)
Chloride: 104 mmol/L (ref 96–106)
Creatinine, Ser: 0.87 mg/dL (ref 0.57–1.00)
GFR calc Af Amer: 96 mL/min/{1.73_m2} (ref >59)
GFR calc non Af Amer: 83 mL/min/{1.73_m2} (ref >59)
Globulin, Total: 2.5 g/dL (ref 1.5–4.5)
Glucose: 86 mg/dL (ref 65–99)
Potassium: 4.5 mmol/L (ref 3.5–5.2)
Sodium: 140 mmol/L (ref 134–144)
Total Protein: 6.5 g/dL (ref 6.0–8.5)

## 2019-09-20 LAB — LIPID PANEL WITH LDL/HDL RATIO
Cholesterol, Total: 157 mg/dL (ref 100–199)
HDL: 32 mg/dL — ABNORMAL LOW (ref >39)
LDL Chol Calc (NIH): 104 mg/dL — ABNORMAL HIGH (ref 0–99)
LDL/HDL Ratio: 3.3 ratio — ABNORMAL HIGH (ref 0.0–3.2)
Triglycerides: 115 mg/dL (ref 0–149)
VLDL Cholesterol Cal: 21 mg/dL (ref 5–40)

## 2019-09-20 LAB — HEMOGLOBIN A1C
Est. average glucose Bld gHb Est-mCnc: 108 mg/dL
Hgb A1c MFr Bld: 5.4 % (ref 4.8–5.6)

## 2019-09-20 LAB — INSULIN, RANDOM: INSULIN: 7.3 u[IU]/mL (ref 2.6–24.9)

## 2019-09-24 NOTE — Progress Notes (Signed)
Chief Complaint:   OBESITY Alexis Jones is here to discuss her progress with her obesity treatment plan along with follow-up of her obesity related diagnoses. Alexis Jones is on following a lower carbohydrate, vegetable and lean protein rich diet plan and states she is following her eating plan approximately 50% of the time. Alexis Jones states she is doing yard work.  Today's visit was #: 25 Starting weight: 249 lbs Starting date: 05/17/2018 Today's weight: 233 lbs Today's date: 09/19/2019 Total lbs lost to date: 16 Total lbs lost since last in-office visit: 4  Interim History: Alexis Jones continues to do well with weight loss on her eating plan. Her hunger is controlled but she misses eating fruit.  Subjective:   1. Vitamin D deficiency Alexis Jones is stable on Vit D, and she denies nausea or vomiting. She is due for labs.  2. Insulin resistance Alexis Jones is working on diet and exercise, and she is due for labs.  3. Other hyperlipidemia Alexis Jones is working on diet and exercise, and she denies chest pain. She is due for labs.  4. At risk for heart disease Alexis Jones is at a higher than average risk for cardiovascular disease due to obesity.   Assessment/Plan:   1. Vitamin D deficiency Low Vitamin D level contributes to fatigue and are associated with obesity, breast, and colon cancer. We will check labs today, and we will refill prescription Vitamin D for 1 month. Lyris will follow-up for routine testing of Vitamin D, at least 2-3 times per year to avoid over-replacement.  - VITAMIN D 25 Hydroxy (Vit-D Deficiency, Fractures) - Vitamin D, Ergocalciferol, (DRISDOL) 1.25 MG (50000 UNIT) CAPS capsule; Take 1 capsule (50,000 Units total) by mouth every 7 (seven) days.  Dispense: 4 capsule; Refill: 0  2. Insulin resistance Jermiah will continue to work on weight loss, exercise, and decreasing simple carbohydrates to help decrease the risk of diabetes. We will check labs today.  Yeny agreed to follow-up with [name] as directed to closely monitor her progress.  - Comprehensive metabolic panel - Hemoglobin A1c - Insulin, random  3. Other hyperlipidemia Cardiovascular risk and specific lipid/LDL goals reviewed. We discussed several lifestyle modifications today. Somer will continue to work on diet, exercise and weight loss efforts. We will check labs today. Orders and follow up as documented in patient record.   - Comprehensive metabolic panel - Lipid Panel With LDL/HDL Ratio  4. At risk for heart disease Ben was given approximately 15 minutes of coronary artery disease prevention counseling today. She is 39 y.o. female and has risk factors for heart disease including obesity. We discussed intensive lifestyle modifications today with an emphasis on specific weight loss instructions and strategies.   Repetitive spaced learning was employed today to elicit superior memory formation and behavioral change.  5. Class 2 severe obesity with serious comorbidity and body mass index (BMI) of 37.0 to 37.9 in adult, unspecified obesity type (HCC) Alexis Jones is currently in the action stage of change. As such, her goal is to continue with weight loss efforts. She has agreed to change to the [employer].   Exercise goals: As is.  Behavioral modification strategies: decreasing simple carbohydrates and increasing water intake.  Alexis Jones has agreed to follow-up with our clinic in 3 weeks. She was informed of the importance of frequent follow-up visits to maximize her success with intensive lifestyle modifications for her multiple health conditions.   Alexis Jones was informed we would discuss her lab results at her next visit unless there is a critical  issue that needs to be addressed sooner. Alexis Jones agreed to keep her next visit at the agreed upon time to discuss these results.  Objective:   Blood pressure 103/71, pulse 73, temperature 97.9 F (36.6 C), temperature  source Oral, height 5\' 6"  (1.676 m), weight 233 lb (105.7 kg), SpO2 97 %. Body mass index is 37.61 kg/m.  General: Cooperative, alert, well developed, in no acute distress. HEENT: Conjunctivae and lids unremarkable. Cardiovascular: Regular rhythm.  Lungs: Normal work of breathing. Neurologic: No focal deficits.   Lab Results  Component Value Date   CREATININE 0.87 09/19/2019   BUN 10 09/19/2019   NA 140 09/19/2019   K 4.5 09/19/2019   CL 104 09/19/2019   CO2 24 09/19/2019   Lab Results  Component Value Date   ALT 13 09/19/2019   AST 9 09/19/2019   ALKPHOS 55 09/19/2019   BILITOT 0.3 09/19/2019   Lab Results  Component Value Date   HGBA1C 5.4 09/19/2019   HGBA1C 5.5 05/09/2019   HGBA1C 5.3 12/28/2018   HGBA1C 5.8 (H) 05/17/2018   HGBA1C 5.9 (H) 04/24/2018   Lab Results  Component Value Date   INSULIN 7.3 09/19/2019   INSULIN 17.8 05/09/2019   INSULIN 104.0 (H) 12/28/2018   INSULIN 13.0 05/17/2018   Lab Results  Component Value Date   TSH 2.010 12/28/2018   Lab Results  Component Value Date   CHOL 157 09/19/2019   HDL 32 (L) 09/19/2019   LDLCALC 104 (H) 09/19/2019   TRIG 115 09/19/2019   CHOLHDL 7.0 (H) 04/24/2018   Lab Results  Component Value Date   WBC 8.2 12/28/2018   HGB 12.7 12/28/2018   HCT 37.1 12/28/2018   MCV 84 12/28/2018   PLT 259 12/28/2018   Lab Results  Component Value Date   IRON 41 04/27/2018   TIBC 350 04/27/2018   FERRITIN 8 (L) 04/27/2018   Attestation Statements:   Reviewed by clinician on day of visit: allergies, medications, problem list, medical history, surgical history, family history, social history, and previous encounter notes.   I, Trixie Dredge, am acting as transcriptionist for Dennard Nip, MD.  I have reviewed the above documentation for accuracy and completeness, and I agree with the above. -  Dennard Nip, MD

## 2019-10-10 ENCOUNTER — Encounter (INDEPENDENT_AMBULATORY_CARE_PROVIDER_SITE_OTHER): Payer: Self-pay | Admitting: Family Medicine

## 2019-10-10 ENCOUNTER — Other Ambulatory Visit: Payer: Self-pay

## 2019-10-10 ENCOUNTER — Ambulatory Visit (INDEPENDENT_AMBULATORY_CARE_PROVIDER_SITE_OTHER): Payer: BC Managed Care – PPO | Admitting: Family Medicine

## 2019-10-10 VITALS — BP 111/78 | HR 75 | Temp 98.6°F | Ht 66.0 in | Wt 228.0 lb

## 2019-10-10 DIAGNOSIS — F3289 Other specified depressive episodes: Secondary | ICD-10-CM | POA: Diagnosis not present

## 2019-10-10 DIAGNOSIS — Z6836 Body mass index (BMI) 36.0-36.9, adult: Secondary | ICD-10-CM

## 2019-10-10 DIAGNOSIS — E559 Vitamin D deficiency, unspecified: Secondary | ICD-10-CM | POA: Diagnosis not present

## 2019-10-10 DIAGNOSIS — Z9189 Other specified personal risk factors, not elsewhere classified: Secondary | ICD-10-CM

## 2019-10-10 MED ORDER — VITAMIN D (ERGOCALCIFEROL) 1.25 MG (50000 UNIT) PO CAPS: 50000.0000 [IU] | ORAL_CAPSULE | ORAL | 0 refills | Status: DC

## 2019-10-10 MED ORDER — BUPROPION HCL ER (SR) 150 MG PO TB12: 150.0000 mg | ORAL_TABLET | Freq: Two times a day (BID) | ORAL | 0 refills | Status: DC

## 2019-10-11 NOTE — Progress Notes (Signed)
Chief Complaint:   OBESITY Alexis Jones is here to discuss her progress with her obesity treatment plan along with follow-up of her obesity related diagnoses. Alexis Jones is on following a lower carbohydrate, vegetable and lean protein rich diet plan and states she is following her eating plan approximately 60% of the time. Alexis Jones states she is doing stretches, yoga poses, and bike riding for 30-40 minutes 3 times per week.  Today's visit was #: 26 Starting weight: 249 lbs Starting date: 05/17/2018 Today's weight: 228 lbs Today's date: 10/10/2019 Total lbs lost to date: 21 Total lbs lost since last in-office visit: 5  Interim History: Alexis Jones continues to do well with weight loss but she has struggled to follow a Low carbohydrate plan closely. She has had a lot of stress recently, but she has still done well being mindful and has increased her exercise.  Subjective:   1. Vitamin D deficiency Garry is stable on Vit D, and she denies nausea or vomiting.  2. Other depression with emotional eating Alexis Jones has had a lot of stress with family issues, and she is going through a separation. She feels she is still in shock but she appears to be handling things well. She is looking for a therapist.  3. At risk for dehydration Alexis Jones is at risk for dehydration due to inadequate water intake.  Assessment/Plan:   1. Vitamin D deficiency Low Vitamin D level contributes to fatigue and are associated with obesity, breast, and colon cancer. We will refill prescription Vitamin D for 1 month. Alexis Jones will follow-up for routine testing of Vitamin D, at least 2-3 times per year to avoid over-replacement.  - Vitamin D, Ergocalciferol, (DRISDOL) 1.25 MG (50000 UNIT) CAPS capsule; Take 1 capsule (50,000 Units total) by mouth every 7 (seven) days.  Dispense: 4 capsule; Refill: 0  2. Other depression with emotional eating Behavior modification techniques were discussed today to help  Alexis Jones deal with her emotional/non-hunger eating behaviors. We will refill Wellbutrin SR for 1 month, and Lile will continue Lexapro as is. Behavioral health resources handout was given. Orders and follow up as documented in patient record.   - buPROPion (WELLBUTRIN SR) 150 MG 12 hr tablet; Take 1 tablet (150 mg total) by mouth 2 (two) times daily.  Dispense: 60 tablet; Refill: 0  3. At risk for dehydration Alexis Jones was given approximately 15 minutes dehydration prevention counseling today. Alexis Jones is at risk for dehydration due to weight loss and current medication(s). She was encouraged to hydrate and monitor fluid status to avoid dehydration as well as weight loss plateaus.   4. Class 2 severe obesity with serious comorbidity and body mass index (BMI) of 36.0 to 36.9 in adult, unspecified obesity type (HCC) Alexis Jones is currently in the action stage of change. As such, her goal is to continue with weight loss efforts. She has agreed to change to the Category 2 Plan or keeping a food journal and adhering to recommended goals of 1300-1400 calories and 85+ grams of protein daily.   Exercise goals: As is.  Behavioral modification strategies: increasing lean protein intake and meal planning and cooking strategies.  Alexis Jones has agreed to follow-up with our clinic in 3 weeks. She was informed of the importance of frequent follow-up visits to maximize her success with intensive lifestyle modifications for her multiple health conditions.   Objective:   Blood pressure 111/78, pulse 75, temperature 98.6 F (37 C), temperature source Oral, height 5\' 6"  (1.676 m), weight 228 lb (103.4 kg), SpO2  96 %. Body mass index is 36.8 kg/m.  General: Cooperative, alert, well developed, in no acute distress. HEENT: Conjunctivae and lids unremarkable. Cardiovascular: Regular rhythm.  Lungs: Normal work of breathing. Neurologic: No focal deficits.   Lab Results  Component Value Date   CREATININE  0.87 09/19/2019   BUN 10 09/19/2019   NA 140 09/19/2019   K 4.5 09/19/2019   CL 104 09/19/2019   CO2 24 09/19/2019   Lab Results  Component Value Date   ALT 13 09/19/2019   AST 9 09/19/2019   ALKPHOS 55 09/19/2019   BILITOT 0.3 09/19/2019   Lab Results  Component Value Date   HGBA1C 5.4 09/19/2019   HGBA1C 5.5 05/09/2019   HGBA1C 5.3 12/28/2018   HGBA1C 5.8 (H) 05/17/2018   HGBA1C 5.9 (H) 04/24/2018   Lab Results  Component Value Date   INSULIN 7.3 09/19/2019   INSULIN 17.8 05/09/2019   INSULIN 104.0 (H) 12/28/2018   INSULIN 13.0 05/17/2018   Lab Results  Component Value Date   TSH 2.010 12/28/2018   Lab Results  Component Value Date   CHOL 157 09/19/2019   HDL 32 (L) 09/19/2019   LDLCALC 104 (H) 09/19/2019   TRIG 115 09/19/2019   CHOLHDL 7.0 (H) 04/24/2018   Lab Results  Component Value Date   WBC 8.2 12/28/2018   HGB 12.7 12/28/2018   HCT 37.1 12/28/2018   MCV 84 12/28/2018   PLT 259 12/28/2018   Lab Results  Component Value Date   IRON 41 04/27/2018   TIBC 350 04/27/2018   FERRITIN 8 (L) 04/27/2018   Attestation Statements:   Reviewed by clinician on day of visit: allergies, medications, problem list, medical history, surgical history, family history, social history, and previous encounter notes.   I, Trixie Dredge, am acting as transcriptionist for Dennard Nip, MD.  I have reviewed the above documentation for accuracy and completeness, and I agree with the above. -  Dennard Nip, MD

## 2019-11-01 ENCOUNTER — Other Ambulatory Visit: Payer: Self-pay

## 2019-11-01 ENCOUNTER — Encounter (INDEPENDENT_AMBULATORY_CARE_PROVIDER_SITE_OTHER): Payer: Self-pay | Admitting: Family Medicine

## 2019-11-01 ENCOUNTER — Ambulatory Visit (INDEPENDENT_AMBULATORY_CARE_PROVIDER_SITE_OTHER): Payer: BC Managed Care – PPO | Admitting: Family Medicine

## 2019-11-01 VITALS — BP 100/68 | HR 80 | Temp 98.3°F | Ht 66.0 in | Wt 228.0 lb

## 2019-11-01 DIAGNOSIS — F3289 Other specified depressive episodes: Secondary | ICD-10-CM

## 2019-11-01 DIAGNOSIS — E66812 Obesity, class 2: Secondary | ICD-10-CM

## 2019-11-01 DIAGNOSIS — E559 Vitamin D deficiency, unspecified: Secondary | ICD-10-CM

## 2019-11-01 DIAGNOSIS — Z9189 Other specified personal risk factors, not elsewhere classified: Secondary | ICD-10-CM

## 2019-11-01 DIAGNOSIS — Z6836 Body mass index (BMI) 36.0-36.9, adult: Secondary | ICD-10-CM

## 2019-11-01 MED ORDER — VITAMIN D (ERGOCALCIFEROL) 1.25 MG (50000 UNIT) PO CAPS: 50000.0000 [IU] | ORAL_CAPSULE | ORAL | 0 refills | Status: DC

## 2019-11-06 NOTE — Progress Notes (Signed)
Chief Complaint:   OBESITY Alexis Jones is here to discuss her progress with her obesity treatment plan along with follow-up of her obesity related diagnoses. Alexis Jones is on the Category 2 Plan or keeping a food journal and adhering to recommended goals of 1300-1400 calories and 85+ grams of protein daily and states she is following her eating plan approximately 60% of the time. Alexis Jones states she is riding the stationary bike and walking for 20-30 minutes 2 times per week.  Today's visit was #: 36 Starting weight: 249 lbs Starting date: 05/17/2018 Today's weight: 228 lbs Today's date: 11/01/2019 Total lbs lost to date: 21 Total lbs lost since last in-office visit: 0  Interim History: Alexis Jones has done well maintaining her weight loss. She is dealing with a messy separation and she has had more stress eating behaviors.   Subjective:   1. Vitamin D deficiency Alexis Jones is stable on Vit D, and her last Vit D level was at goal.  2. Other depression with emotional eating Alexis Jones is doing well on her medications. Her sleep isn't as restful but she feels this is more due to stress at home.  3. At risk for impaired metabolic function Alexis Jones is at increased risk for impaired metabolic function due to decreased sleep.  Assessment/Plan:   1. Vitamin D deficiency Low Vitamin D level contributes to fatigue and are associated with obesity, breast, and colon cancer. We will refill prescription Vitamin D for 1 month. Alexis Jones will follow-up for routine testing of Vitamin D, at least 2-3 times per year to avoid over-replacement. We will recheck labs in 1 month.  - Vitamin D, Ergocalciferol, (DRISDOL) 1.25 MG (50000 UNIT) CAPS capsule; Take 1 capsule (50,000 Units total) by mouth every 7 (seven) days.  Dispense: 4 capsule; Refill: 0  2. Other depression with emotional eating Behavior modification techniques were discussed today to help Alexis Jones deal with her emotional/non-hunger eating  behaviors. Alexis Jones will continue her medications as is, and we discussed strategies to help with sleep. Orders and follow up as documented in patient record.   3. At risk for impaired metabolic function Alexis Jones was given approximately 15 minutes of impaired  metabolic function prevention counseling today. We discussed intensive lifestyle modifications today with an emphasis on specific nutrition and exercise instructions and strategies.   Repetitive spaced learning was employed today to elicit superior memory formation and behavioral change.  4. Class 2 severe obesity with serious comorbidity and body mass index (BMI) of 36.0 to 36.9 in adult, unspecified obesity type (HCC) Alexis Jones is currently in the action stage of change. As such, her goal is to continue with weight loss efforts. She has agreed to the Category 2 Plan or keeping a food journal and adhering to recommended goals of 1400 calories and 80 grams of protein daily.   Exercise goals: As is.  Behavioral modification strategies: increasing lean protein intake.  Alexis Jones has agreed to follow-up with our clinic in 3 weeks. She was informed of the importance of frequent follow-up visits to maximize her success with intensive lifestyle modifications for her multiple health conditions.   Objective:   Blood pressure 100/68, pulse 80, temperature 98.3 F (36.8 C), height 5\' 6"  (1.676 m), weight 228 lb (103.4 kg), SpO2 98 %. Body mass index is 36.8 kg/m.  General: Cooperative, alert, well developed, in no acute distress. HEENT: Conjunctivae and lids unremarkable. Cardiovascular: Regular rhythm.  Lungs: Normal work of breathing. Neurologic: No focal deficits.   Lab Results  Component Value Date  CREATININE 0.87 09/19/2019   BUN 10 09/19/2019   NA 140 09/19/2019   K 4.5 09/19/2019   CL 104 09/19/2019   CO2 24 09/19/2019   Lab Results  Component Value Date   ALT 13 09/19/2019   AST 9 09/19/2019   ALKPHOS 55 09/19/2019     BILITOT 0.3 09/19/2019   Lab Results  Component Value Date   HGBA1C 5.4 09/19/2019   HGBA1C 5.5 05/09/2019   HGBA1C 5.3 12/28/2018   HGBA1C 5.8 (H) 05/17/2018   HGBA1C 5.9 (H) 04/24/2018   Lab Results  Component Value Date   INSULIN 7.3 09/19/2019   INSULIN 17.8 05/09/2019   INSULIN 104.0 (H) 12/28/2018   INSULIN 13.0 05/17/2018   Lab Results  Component Value Date   TSH 2.010 12/28/2018   Lab Results  Component Value Date   CHOL 157 09/19/2019   HDL 32 (L) 09/19/2019   LDLCALC 104 (H) 09/19/2019   TRIG 115 09/19/2019   CHOLHDL 7.0 (H) 04/24/2018   Lab Results  Component Value Date   WBC 8.2 12/28/2018   HGB 12.7 12/28/2018   HCT 37.1 12/28/2018   MCV 84 12/28/2018   PLT 259 12/28/2018   Lab Results  Component Value Date   IRON 41 04/27/2018   TIBC 350 04/27/2018   FERRITIN 8 (L) 04/27/2018   Attestation Statements:   Reviewed by clinician on day of visit: allergies, medications, problem list, medical history, surgical history, family history, social history, and previous encounter notes.   I, Trixie Dredge, am acting as transcriptionist for Dennard Nip, MD.  I have reviewed the above documentation for accuracy and completeness, and I agree with the above. -  Dennard Nip, MD

## 2019-11-08 ENCOUNTER — Other Ambulatory Visit (INDEPENDENT_AMBULATORY_CARE_PROVIDER_SITE_OTHER): Payer: Self-pay | Admitting: Family Medicine

## 2019-11-08 DIAGNOSIS — F3289 Other specified depressive episodes: Secondary | ICD-10-CM

## 2019-11-08 NOTE — Telephone Encounter (Signed)
Mychart message sent to patient.

## 2019-11-21 ENCOUNTER — Other Ambulatory Visit: Payer: Self-pay

## 2019-11-21 ENCOUNTER — Ambulatory Visit (INDEPENDENT_AMBULATORY_CARE_PROVIDER_SITE_OTHER): Payer: BC Managed Care – PPO | Admitting: Family Medicine

## 2019-11-21 VITALS — BP 108/58 | HR 65 | Temp 98.2°F | Ht 66.0 in | Wt 227.0 lb

## 2019-11-21 DIAGNOSIS — F3289 Other specified depressive episodes: Secondary | ICD-10-CM | POA: Diagnosis not present

## 2019-11-21 DIAGNOSIS — F32A Depression, unspecified: Secondary | ICD-10-CM | POA: Insufficient documentation

## 2019-11-22 NOTE — Progress Notes (Signed)
Chief Complaint:   OBESITY Alexis Jones is here to discuss her progress with her obesity treatment plan along with follow-up of her obesity related diagnoses. Alexis Jones is on the Category 2 Plan or keeping a food journal and adhering to recommended goals of 1400 calories and 80 grams of protein daily and states she is following her eating plan approximately 60% of the time. Alexis Jones states she is doing 0 minutes 0 times per week.  Today's visit was #: 28 Starting weight: 249 lbs Starting date: 05/17/2018 Today's weight: 227 lbs Today's date: 11/21/2019 Total lbs lost to date: 22 Total lbs lost since last in-office visit: 1  Interim History: Alexis Jones continues to do well with weight loss going between Category 2 and journaling. She is doing well meeting her protein goals and with meal planning.  Subjective:   1. Other depression with emotional eating Alexis Jones is doing well on her medications. She is supported by her husband and she feels that this has helped her mood. She recently started therapy and she appears to be looking forward to her future.  Assessment/Plan:   1. Other depression with emotional eating Behavior modification techniques were discussed today to help Alexis Jones deal with her emotional/non-hunger eating behaviors. Alexis Jones will continue Lexapro and Wellbutrin SR, no refill needed today and will continue to follow closely. Orders and follow up as documented in patient record.   2. Class 2 severe obesity with serious comorbidity and body mass index (BMI) of 36.0 to 36.9 in adult, unspecified obesity type (HCC) Alexis Jones is currently in the action stage of change. As such, her goal is to continue with weight loss efforts. She has agreed to the Category 2 Plan or keeping a food journal and adhering to recommended goals of 1400-1600 calories and 90+ grams of protein daily.   Behavioral modification strategies: decreasing simple carbohydrates.  Alexis Jones has agreed to  follow-up with our clinic in 3 weeks. She was informed of the importance of frequent follow-up visits to maximize her success with intensive lifestyle modifications for her multiple health conditions.   Objective:   Blood pressure (!) 108/58, pulse 65, temperature 98.2 F (36.8 C), height 5\' 6"  (1.676 m), weight 227 lb (103 kg), SpO2 98 %. Body mass index is 36.64 kg/m.  General: Cooperative, alert, well developed, in no acute distress. HEENT: Conjunctivae and lids unremarkable. Cardiovascular: Regular rhythm.  Lungs: Normal work of breathing. Neurologic: No focal deficits.   Lab Results  Component Value Date   CREATININE 0.87 09/19/2019   BUN 10 09/19/2019   NA 140 09/19/2019   K 4.5 09/19/2019   CL 104 09/19/2019   CO2 24 09/19/2019   Lab Results  Component Value Date   ALT 13 09/19/2019   AST 9 09/19/2019   ALKPHOS 55 09/19/2019   BILITOT 0.3 09/19/2019   Lab Results  Component Value Date   HGBA1C 5.4 09/19/2019   HGBA1C 5.5 05/09/2019   HGBA1C 5.3 12/28/2018   HGBA1C 5.8 (H) 05/17/2018   HGBA1C 5.9 (H) 04/24/2018   Lab Results  Component Value Date   INSULIN 7.3 09/19/2019   INSULIN 17.8 05/09/2019   INSULIN 104.0 (H) 12/28/2018   INSULIN 13.0 05/17/2018   Lab Results  Component Value Date   TSH 2.010 12/28/2018   Lab Results  Component Value Date   CHOL 157 09/19/2019   HDL 32 (L) 09/19/2019   LDLCALC 104 (H) 09/19/2019   TRIG 115 09/19/2019   CHOLHDL 7.0 (H) 04/24/2018   Lab Results  Component Value Date   WBC 8.2 12/28/2018   HGB 12.7 12/28/2018   HCT 37.1 12/28/2018   MCV 84 12/28/2018   PLT 259 12/28/2018   Lab Results  Component Value Date   IRON 41 04/27/2018   TIBC 350 04/27/2018   FERRITIN 8 (L) 04/27/2018   Attestation Statements:   Reviewed by clinician on day of visit: allergies, medications, problem list, medical history, surgical history, family history, social history, and previous encounter notes.  Time spent on visit  including pre-visit chart review and post-visit care and charting was 25 minutes.    I, Trixie Dredge, am acting as transcriptionist for Dennard Nip, MD.  I have reviewed the above documentation for accuracy and completeness, and I agree with the above. -  Dennard Nip, MD

## 2019-12-17 ENCOUNTER — Other Ambulatory Visit: Payer: Self-pay

## 2019-12-17 ENCOUNTER — Encounter (INDEPENDENT_AMBULATORY_CARE_PROVIDER_SITE_OTHER): Payer: Self-pay | Admitting: Family Medicine

## 2019-12-17 ENCOUNTER — Ambulatory Visit (INDEPENDENT_AMBULATORY_CARE_PROVIDER_SITE_OTHER): Payer: BC Managed Care – PPO | Admitting: Family Medicine

## 2019-12-17 VITALS — BP 106/69 | HR 77 | Temp 98.7°F | Ht 66.0 in | Wt 231.0 lb

## 2019-12-17 DIAGNOSIS — F3289 Other specified depressive episodes: Secondary | ICD-10-CM

## 2019-12-17 DIAGNOSIS — Z9189 Other specified personal risk factors, not elsewhere classified: Secondary | ICD-10-CM

## 2019-12-17 DIAGNOSIS — Z6837 Body mass index (BMI) 37.0-37.9, adult: Secondary | ICD-10-CM

## 2019-12-17 DIAGNOSIS — E559 Vitamin D deficiency, unspecified: Secondary | ICD-10-CM | POA: Diagnosis not present

## 2019-12-20 MED ORDER — BUPROPION HCL ER (SR) 150 MG PO TB12: 150.0000 mg | ORAL_TABLET | Freq: Two times a day (BID) | ORAL | 0 refills | Status: DC

## 2019-12-20 MED ORDER — VITAMIN D (ERGOCALCIFEROL) 1.25 MG (50000 UNIT) PO CAPS: 50000.0000 [IU] | ORAL_CAPSULE | ORAL | 0 refills | Status: DC

## 2019-12-25 NOTE — Progress Notes (Signed)
Chief Complaint:   OBESITY Alexis Jones is here to discuss her progress with her obesity treatment plan along with follow-up of her obesity related diagnoses. Alexis Jones is on the Category 2 Plan or keeping a food journal and adhering to recommended goals of 1400-1600 calories and 90+ grams of protein daily and states she is following her eating plan approximately 60% of the time. Alexis Jones states she is gardening and on the bike for 15-30 minutes 3 times per week.  Today's visit was #: 72 Starting weight: 249 lbs Starting date: 05/17/2018 Today's weight: 231 lbs Today's date: 12/17/2019 Total lbs lost to date: 18 Total lbs lost since last in-office visit: 0  Interim History: Alexis Jones has been on vacations and she did some celebration eating. She is ready to get back on track. She is not too concerned about Halloween weight gain, and she has some good strategies planned.  Subjective:   1. Vitamin D deficiency Alexis Jones is stable on Vit D, and she denies nausea or vomiting. She is due for labs soon.  2. Other depression with emotional eating Alexis Jones is stable on her medications, and is dealing with emotional eating well overall. She has been working on getting better sleep.  3. At risk for heart disease Alexis Jones is at a higher than average risk for cardiovascular disease due to obesity.   Assessment/Plan:   1. Vitamin D deficiency Low Vitamin D level contributes to fatigue and are associated with obesity, breast, and colon cancer. We will refill prescription Vitamin D for 1 month, and we will recheck labs in 1 month. Alexis Jones will follow-up for routine testing of Vitamin D, at least 2-3 times per year to avoid over-replacement.  - Vitamin D, Ergocalciferol, (DRISDOL) 1.25 MG (50000 UNIT) CAPS capsule; Take 1 capsule (50,000 Units total) by mouth every 7 (seven) days.  Dispense: 4 capsule; Refill: 0  2. Other depression with emotional eating Behavior modification techniques  were discussed today to help Alexis Jones deal with her emotional/non-hunger eating behaviors. We will refill Wellbutrin SR for 1 month, and change Lexapro to morning and will follow closely. Orders and follow up as documented in patient record.   - buPROPion (WELLBUTRIN SR) 150 MG 12 hr tablet; Take 1 tablet (150 mg total) by mouth 2 (two) times daily.  Dispense: 60 tablet; Refill: 0  3. At risk for heart disease Alexis Jones was given approximately 15 minutes of coronary artery disease prevention counseling today. She is 39 y.o. female and has risk factors for heart disease including obesity. We discussed intensive lifestyle modifications today with an emphasis on specific weight loss instructions and strategies.   Repetitive spaced learning was employed today to elicit superior memory formation and behavioral change.  4. Class 2 severe obesity with serious comorbidity and body mass index (BMI) of 37.0 to 37.9 in adult, unspecified obesity type (HCC) Alexis Jones is currently in the action stage of change. As such, her goal is to continue with weight loss efforts. She has agreed to the Category 2 Plan.   Exercise goals: As is.  Behavioral modification strategies: increasing lean protein intake and holiday eating strategies .  Alexis Jones has agreed to follow-up with our clinic in 2 to 3 weeks. She was informed of the importance of frequent follow-up visits to maximize her success with intensive lifestyle modifications for her multiple health conditions.   Objective:   Blood pressure 106/69, pulse 77, temperature 98.7 F (37.1 C), height 5\' 6"  (1.676 m), weight 231 lb (104.8 kg), SpO2 98 %.  Body mass index is 37.28 kg/m.  General: Cooperative, alert, well developed, in no acute distress. HEENT: Conjunctivae and lids unremarkable. Cardiovascular: Regular rhythm.  Lungs: Normal work of breathing. Neurologic: No focal deficits.   Lab Results  Component Value Date   CREATININE 0.87 09/19/2019    BUN 10 09/19/2019   NA 140 09/19/2019   K 4.5 09/19/2019   CL 104 09/19/2019   CO2 24 09/19/2019   Lab Results  Component Value Date   ALT 13 09/19/2019   AST 9 09/19/2019   ALKPHOS 55 09/19/2019   BILITOT 0.3 09/19/2019   Lab Results  Component Value Date   HGBA1C 5.4 09/19/2019   HGBA1C 5.5 05/09/2019   HGBA1C 5.3 12/28/2018   HGBA1C 5.8 (H) 05/17/2018   HGBA1C 5.9 (H) 04/24/2018   Lab Results  Component Value Date   INSULIN 7.3 09/19/2019   INSULIN 17.8 05/09/2019   INSULIN 104.0 (H) 12/28/2018   INSULIN 13.0 05/17/2018   Lab Results  Component Value Date   TSH 2.010 12/28/2018   Lab Results  Component Value Date   CHOL 157 09/19/2019   HDL 32 (L) 09/19/2019   LDLCALC 104 (H) 09/19/2019   TRIG 115 09/19/2019   CHOLHDL 7.0 (H) 04/24/2018   Lab Results  Component Value Date   WBC 8.2 12/28/2018   HGB 12.7 12/28/2018   HCT 37.1 12/28/2018   MCV 84 12/28/2018   PLT 259 12/28/2018   Lab Results  Component Value Date   IRON 41 04/27/2018   TIBC 350 04/27/2018   FERRITIN 8 (L) 04/27/2018   Attestation Statements:   Reviewed by clinician on day of visit: allergies, medications, problem list, medical history, surgical history, family history, social history, and previous encounter notes.   I, Trixie Dredge, am acting as transcriptionist for Dennard Nip, MD.  I have reviewed the above documentation for accuracy and completeness, and I agree with the above. -  Dennard Nip, MD

## 2019-12-31 ENCOUNTER — Ambulatory Visit (INDEPENDENT_AMBULATORY_CARE_PROVIDER_SITE_OTHER): Payer: BC Managed Care – PPO | Admitting: Family Medicine

## 2019-12-31 ENCOUNTER — Other Ambulatory Visit: Payer: Self-pay

## 2019-12-31 ENCOUNTER — Encounter (INDEPENDENT_AMBULATORY_CARE_PROVIDER_SITE_OTHER): Payer: Self-pay | Admitting: Family Medicine

## 2019-12-31 VITALS — BP 107/70 | HR 72 | Temp 98.2°F | Ht 66.0 in | Wt 234.0 lb

## 2019-12-31 DIAGNOSIS — E66812 Obesity, class 2: Secondary | ICD-10-CM

## 2019-12-31 DIAGNOSIS — Z6837 Body mass index (BMI) 37.0-37.9, adult: Secondary | ICD-10-CM

## 2019-12-31 DIAGNOSIS — R7303 Prediabetes: Secondary | ICD-10-CM | POA: Diagnosis not present

## 2019-12-31 DIAGNOSIS — E559 Vitamin D deficiency, unspecified: Secondary | ICD-10-CM | POA: Diagnosis not present

## 2019-12-31 DIAGNOSIS — E7849 Other hyperlipidemia: Secondary | ICD-10-CM

## 2020-01-01 NOTE — Progress Notes (Signed)
Chief Complaint:   OBESITY Asra is here to discuss her progress with her obesity treatment plan along with follow-up of her obesity related diagnoses. Earnest is on the Category 2 Plan and states she is following her eating plan approximately 60% of the time. Kristopher states she is walking for 15 minutes 4 times per week.  Today's visit was #: 30 Starting weight: 249 lbs Starting date: 05/17/2018 Today's weight: 234 lbs Today's date: 12/31/2019 Total lbs lost to date: 15 Total lbs lost since last in-office visit: 0  Interim History: Makaria is retaining some fluid today. She has actually done well maintaining her weight since her last visit. She has 2 dogs now and she is active while walking them daily. She is still dealing with family stress but she is ready to get back on track.  Subjective:   1. Other hyperlipidemia Glorious is working on diet and weight loss to improve her cholesterol. She will be due to have labs rechecked soon.  2. Pre-diabetes Wilene's last A1c has greatly improved with diet and weight loss. She is due to have labs rechecked soon.  3. Vitamin D deficiency Yared's Vit D level is stable. She will be due to have labs rechecked soon. She is at high risk of over-replacement.  Assessment/Plan:   1. Other hyperlipidemia Cardiovascular risk and specific lipid/LDL goals reviewed. We discussed several lifestyle modifications today. Ebunoluwa will continue to work on diet, exercise and weight loss efforts. We will recheck labs in 2-3 weeks at her next visit. Orders and follow up as documented in patient record.   2. Pre-diabetes Selenia will continue to work on weight loss, exercise, and decreasing simple carbohydrates to help decrease the risk of diabetes. We will recheck labs in 2-3 weeks at her next visit.  3. Vitamin D deficiency Low Vitamin D level contributes to fatigue and are associated with obesity, breast, and colon cancer. Cayci  agreed to continue taking prescription Vitamin D 50,000 IU every week, and we will recheck labs in 2-3 weeks at her next visit. She will follow-up for routine testing of Vitamin D, at least 2-3 times per year to avoid over-replacement.  4. Class 2 severe obesity with serious comorbidity and body mass index (BMI) of 37.0 to 37.9 in adult, unspecified obesity type (HCC) Camera is currently in the action stage of change. As such, her goal is to continue with weight loss efforts. She has agreed to change to the [employer].   We will recheck fasting labs at her next visit.  Exercise goals: As is.  Behavioral modification strategies: increasing lean protein intake.  Mialee has agreed to follow-up with our clinic in 2 to 3 weeks. She was informed of the importance of frequent follow-up visits to maximize her success with intensive lifestyle modifications for her multiple health conditions.   Objective:   Blood pressure 107/70, pulse 72, temperature 98.2 F (36.8 C), height 5\' 6"  (1.676 m), weight 234 lb (106.1 kg), SpO2 97 %. Body mass index is 37.77 kg/m.  General: Cooperative, alert, well developed, in no acute distress. HEENT: Conjunctivae and lids unremarkable. Cardiovascular: Regular rhythm.  Lungs: Normal work of breathing. Neurologic: No focal deficits.   Lab Results  Component Value Date   CREATININE 0.87 09/19/2019   BUN 10 09/19/2019   NA 140 09/19/2019   K 4.5 09/19/2019   CL 104 09/19/2019   CO2 24 09/19/2019   Lab Results  Component Value Date   ALT 13 09/19/2019  AST 9 09/19/2019   ALKPHOS 55 09/19/2019   BILITOT 0.3 09/19/2019   Lab Results  Component Value Date   HGBA1C 5.4 09/19/2019   HGBA1C 5.5 05/09/2019   HGBA1C 5.3 12/28/2018   HGBA1C 5.8 (H) 05/17/2018   HGBA1C 5.9 (H) 04/24/2018   Lab Results  Component Value Date   INSULIN 7.3 09/19/2019   INSULIN 17.8 05/09/2019   INSULIN 104.0 (H) 12/28/2018   INSULIN 13.0 05/17/2018   Lab Results    Component Value Date   TSH 2.010 12/28/2018   Lab Results  Component Value Date   CHOL 157 09/19/2019   HDL 32 (L) 09/19/2019   LDLCALC 104 (H) 09/19/2019   TRIG 115 09/19/2019   CHOLHDL 7.0 (H) 04/24/2018   Lab Results  Component Value Date   WBC 8.2 12/28/2018   HGB 12.7 12/28/2018   HCT 37.1 12/28/2018   MCV 84 12/28/2018   PLT 259 12/28/2018   Lab Results  Component Value Date   IRON 41 04/27/2018   TIBC 350 04/27/2018   FERRITIN 8 (L) 04/27/2018   Attestation Statements:   Reviewed by clinician on day of visit: allergies, medications, problem list, medical history, surgical history, family history, social history, and previous encounter notes.  Time spent on visit including pre-visit chart review and post-visit care and charting was 32 minutes.    I, Trixie Dredge, am acting as transcriptionist for Dennard Nip, MD.  I have reviewed the above documentation for accuracy and completeness, and I agree with the above. -  Dennard Nip, MD

## 2020-01-09 ENCOUNTER — Other Ambulatory Visit (INDEPENDENT_AMBULATORY_CARE_PROVIDER_SITE_OTHER): Payer: Self-pay | Admitting: Family Medicine

## 2020-01-09 DIAGNOSIS — E559 Vitamin D deficiency, unspecified: Secondary | ICD-10-CM

## 2020-01-09 NOTE — Telephone Encounter (Signed)
Dr.Beasley 

## 2020-01-13 ENCOUNTER — Other Ambulatory Visit (INDEPENDENT_AMBULATORY_CARE_PROVIDER_SITE_OTHER): Payer: Self-pay | Admitting: Family Medicine

## 2020-01-13 DIAGNOSIS — F3289 Other specified depressive episodes: Secondary | ICD-10-CM

## 2020-01-14 ENCOUNTER — Encounter (INDEPENDENT_AMBULATORY_CARE_PROVIDER_SITE_OTHER): Payer: Self-pay

## 2020-01-14 NOTE — Telephone Encounter (Signed)
MyChart message sent to pt to find out if they have enough medication to get them through until next appt.   

## 2020-01-21 ENCOUNTER — Other Ambulatory Visit: Payer: Self-pay

## 2020-01-21 ENCOUNTER — Encounter (INDEPENDENT_AMBULATORY_CARE_PROVIDER_SITE_OTHER): Payer: Self-pay | Admitting: Family Medicine

## 2020-01-21 ENCOUNTER — Ambulatory Visit (INDEPENDENT_AMBULATORY_CARE_PROVIDER_SITE_OTHER): Payer: BC Managed Care – PPO | Admitting: Family Medicine

## 2020-01-21 VITALS — BP 121/79 | HR 81 | Temp 97.6°F | Ht 66.0 in | Wt 229.0 lb

## 2020-01-21 DIAGNOSIS — E559 Vitamin D deficiency, unspecified: Secondary | ICD-10-CM

## 2020-01-21 DIAGNOSIS — E7849 Other hyperlipidemia: Secondary | ICD-10-CM | POA: Diagnosis not present

## 2020-01-21 DIAGNOSIS — R7303 Prediabetes: Secondary | ICD-10-CM

## 2020-01-21 DIAGNOSIS — E66812 Obesity, class 2: Secondary | ICD-10-CM

## 2020-01-21 DIAGNOSIS — Z6837 Body mass index (BMI) 37.0-37.9, adult: Secondary | ICD-10-CM

## 2020-01-21 DIAGNOSIS — Z9189 Other specified personal risk factors, not elsewhere classified: Secondary | ICD-10-CM

## 2020-01-21 MED ORDER — VITAMIN D (ERGOCALCIFEROL) 1.25 MG (50000 UNIT) PO CAPS: 50000.0000 [IU] | ORAL_CAPSULE | ORAL | 0 refills | Status: DC

## 2020-01-21 NOTE — Progress Notes (Signed)
Chief Complaint:   OBESITY Alexis Jones is here to discuss her progress with her obesity treatment plan along with follow-up of her obesity related diagnoses. Alexis Jones is on Paleo and states she is following her eating plan approximately 70% of the time. Alexis Jones states she is walking, cleaning, and gardening for 20 minutes 2 times per week.  Today's visit was #: 46 Starting weight: 249 lbs Starting date: 05/17/2018 Today's weight: 229 lbs Today's date: 01/21/2020 Total lbs lost to date: 20 Total lbs lost since last in-office visit: 5  Interim History: Alexis Jones continues to do well with weight loss, but she struggles with some meal planning especially with the separation from her husband.  Subjective:   1. Pre-diabetes Alexis Jones continues to work on decreasing simple carbohydrates. She is due to have labs checked.  2. Other hyperlipidemia Alexis Jones is working on eating healthy and weight loss to help improve her cholesterol.  3. Vitamin D deficiency Alexis Jones is stable on Vit D, and she denies nausea or vomiting. She is due to have labs checked.  4. At risk for anxiety Alexis Jones is at risk of developing anxiety due to upcoming holidays.  Assessment/Plan:   1. Pre-diabetes Alexis Jones will continue to work on weight loss, exercise, and decreasing simple carbohydrates to help decrease the risk of diabetes. We will check labs today.  - Comprehensive metabolic panel - Insulin, random - Hemoglobin A1c  2. Other hyperlipidemia Cardiovascular risk and specific lipid/LDL goals reviewed. We discussed several lifestyle modifications today. Alexis Jones will continue to work on diet, exercise and weight loss efforts. We will check labs today.Orders and follow up as documented in patient record.   - Lipid Panel With LDL/HDL Ratio  3. Vitamin D deficiency Low Vitamin D level contributes to fatigue and are associated with obesity, breast, and colon cancer. We will check labs today,  and we will refill prescription Vitamin D for 1 month. Alexis Jones will follow-up for routine testing of Vitamin D, at least 2-3 times per year to avoid over-replacement.  - VITAMIN D 25 Hydroxy (Vit-D Deficiency, Fractures)  4. At risk for anxiety Alexis Jones was given approximately 15 minutes of anxiety risk counseling today. She has risk factors for anxiety including stress. We discussed the importance of a healthy work life balance, a healthy relationship with food and a good support system.  Repetitive spaced learning was employed today to elicit superior memory formation and behavioral change.  5. Class 2 severe obesity with serious comorbidity and body mass index (BMI) of 37.0 to 37.9 in adult, unspecified obesity type (HCC) Alexis Jones is currently in the action stage of change. As such, her goal is to continue with weight loss efforts. She has agreed to following a lower carbohydrate, vegetable and lean protein rich diet plan.   Exercise goals: As is.  Behavioral modification strategies: meal planning and cooking strategies and emotional eating strategies.  Alexis Jones has agreed to follow-up with our clinic in 2 to 3 weeks. She was informed of the importance of frequent follow-up visits to maximize her success with intensive lifestyle modifications for her multiple health conditions.   Alexis Jones was informed we would discuss her lab results at her next visit unless there is a critical issue that needs to be addressed sooner. Alexis Jones agreed to keep her next visit at the agreed upon time to discuss these results.  Objective:   Blood pressure 121/79, pulse 81, temperature 97.6 F (36.4 C), height 5\' 6"  (1.676 m), weight 229 lb (103.9 kg), SpO2 98 %.  Body mass index is 36.96 kg/m.  General: Cooperative, alert, well developed, in no acute distress. HEENT: Conjunctivae and lids unremarkable. Cardiovascular: Regular rhythm.  Lungs: Normal work of breathing. Neurologic: No focal deficits.     Lab Results  Component Value Date   CREATININE 0.87 09/19/2019   BUN 10 09/19/2019   NA 140 09/19/2019   K 4.5 09/19/2019   CL 104 09/19/2019   CO2 24 09/19/2019   Lab Results  Component Value Date   ALT 13 09/19/2019   AST 9 09/19/2019   ALKPHOS 55 09/19/2019   BILITOT 0.3 09/19/2019   Lab Results  Component Value Date   HGBA1C 5.4 09/19/2019   HGBA1C 5.5 05/09/2019   HGBA1C 5.3 12/28/2018   HGBA1C 5.8 (H) 05/17/2018   HGBA1C 5.9 (H) 04/24/2018   Lab Results  Component Value Date   INSULIN 7.3 09/19/2019   INSULIN 17.8 05/09/2019   INSULIN 104.0 (H) 12/28/2018   INSULIN 13.0 05/17/2018   Lab Results  Component Value Date   TSH 2.010 12/28/2018   Lab Results  Component Value Date   CHOL 157 09/19/2019   HDL 32 (L) 09/19/2019   LDLCALC 104 (H) 09/19/2019   TRIG 115 09/19/2019   CHOLHDL 7.0 (H) 04/24/2018   Lab Results  Component Value Date   WBC 8.2 12/28/2018   HGB 12.7 12/28/2018   HCT 37.1 12/28/2018   MCV 84 12/28/2018   PLT 259 12/28/2018   Lab Results  Component Value Date   IRON 41 04/27/2018   TIBC 350 04/27/2018   FERRITIN 8 (L) 04/27/2018   Attestation Statements:   Reviewed by clinician on day of visit: allergies, medications, problem list, medical history, surgical history, family history, social history, and previous encounter notes.   I, Trixie Dredge, am acting as transcriptionist for Dennard Nip, MD.  I have reviewed the above documentation for accuracy and completeness, and I agree with the above. -  Dennard Nip, MD

## 2020-01-22 LAB — LIPID PANEL WITH LDL/HDL RATIO
Cholesterol, Total: 181 mg/dL (ref 100–199)
HDL: 36 mg/dL — ABNORMAL LOW (ref >39)
LDL Chol Calc (NIH): 131 mg/dL — ABNORMAL HIGH (ref 0–99)
LDL/HDL Ratio: 3.6 ratio — ABNORMAL HIGH (ref 0.0–3.2)
Triglycerides: 77 mg/dL (ref 0–149)
VLDL Cholesterol Cal: 14 mg/dL (ref 5–40)

## 2020-01-22 LAB — COMPREHENSIVE METABOLIC PANEL
ALT: 15 IU/L (ref 0–32)
AST: 12 IU/L (ref 0–40)
Albumin/Globulin Ratio: 1.7 (ref 1.2–2.2)
Albumin: 4.2 g/dL (ref 3.8–4.8)
Alkaline Phosphatase: 67 IU/L (ref 44–121)
BUN/Creatinine Ratio: 11 (ref 9–23)
BUN: 10 mg/dL (ref 6–24)
Bilirubin Total: 0.4 mg/dL (ref 0.0–1.2)
CO2: 23 mmol/L (ref 20–29)
Calcium: 9.3 mg/dL (ref 8.7–10.2)
Chloride: 103 mmol/L (ref 96–106)
Creatinine, Ser: 0.89 mg/dL (ref 0.57–1.00)
GFR calc Af Amer: 93 mL/min/{1.73_m2} (ref >59)
GFR calc non Af Amer: 81 mL/min/{1.73_m2} (ref >59)
Globulin, Total: 2.5 g/dL (ref 1.5–4.5)
Glucose: 81 mg/dL (ref 65–99)
Potassium: 4.3 mmol/L (ref 3.5–5.2)
Sodium: 139 mmol/L (ref 134–144)
Total Protein: 6.7 g/dL (ref 6.0–8.5)

## 2020-01-22 LAB — HEMOGLOBIN A1C
Est. average glucose Bld gHb Est-mCnc: 108 mg/dL
Hgb A1c MFr Bld: 5.4 % (ref 4.8–5.6)

## 2020-01-22 LAB — INSULIN, RANDOM: INSULIN: 7.4 u[IU]/mL (ref 2.6–24.9)

## 2020-01-22 LAB — VITAMIN D 25 HYDROXY (VIT D DEFICIENCY, FRACTURES): Vit D, 25-Hydroxy: 80 ng/mL (ref 30.0–100.0)

## 2020-02-04 ENCOUNTER — Ambulatory Visit (INDEPENDENT_AMBULATORY_CARE_PROVIDER_SITE_OTHER): Payer: BC Managed Care – PPO | Admitting: Family Medicine

## 2020-02-04 ENCOUNTER — Encounter (INDEPENDENT_AMBULATORY_CARE_PROVIDER_SITE_OTHER): Payer: Self-pay | Admitting: Family Medicine

## 2020-02-04 ENCOUNTER — Other Ambulatory Visit: Payer: Self-pay

## 2020-02-04 VITALS — BP 104/71 | HR 88 | Temp 98.1°F | Ht 66.0 in | Wt 231.0 lb

## 2020-02-04 DIAGNOSIS — E7849 Other hyperlipidemia: Secondary | ICD-10-CM | POA: Diagnosis not present

## 2020-02-04 DIAGNOSIS — Z9189 Other specified personal risk factors, not elsewhere classified: Secondary | ICD-10-CM

## 2020-02-04 DIAGNOSIS — Z6837 Body mass index (BMI) 37.0-37.9, adult: Secondary | ICD-10-CM

## 2020-02-04 DIAGNOSIS — F3289 Other specified depressive episodes: Secondary | ICD-10-CM | POA: Diagnosis not present

## 2020-02-04 DIAGNOSIS — E559 Vitamin D deficiency, unspecified: Secondary | ICD-10-CM | POA: Diagnosis not present

## 2020-02-04 MED ORDER — BUPROPION HCL ER (SR) 150 MG PO TB12: 150.0000 mg | ORAL_TABLET | Freq: Two times a day (BID) | ORAL | 0 refills | Status: DC

## 2020-02-06 NOTE — Progress Notes (Signed)
Chief Complaint:   OBESITY Alexis Jones is here to discuss her progress with her obesity treatment plan along with follow-up of her obesity related diagnoses. Alexis Jones is on following a lower carbohydrate, vegetable and lean protein rich diet plan and states she is following her eating plan approximately 50% of the time. Alexis Jones states she is doing yard work, riding the stationary bike, and walking for 15-20 minutes 3 times per week.  Today's visit was #: 19 Starting weight: 249 lbs Starting date: 05/17/2018 Today's weight: 231 lbs Today's date: 02/04/2020 Total lbs lost to date: 18 Total lbs lost since last in-office visit: 0  Interim History: Alexis Jones has done well minimizing holiday weight gain. She is retaining some fluid today, but she hasn't gained any fat. She is doing well minimizing simple carbohydrates and trying to portion control. She is getting better support from her family.  Subjective:   1. Vitamin D deficiency Alexis Jones's Vit D level is at goal, and she is now at risk of being over-replaced. I discussed labs with the patient today.  2. Other hyperlipidemia Alexis Jones's HLD and triglycerides have improved with diet and weight loss, but her LDL has increased. I discussed labs with the patient today.  3. Other depression with emotional eating Alexis Jones is stable on her medications, and her blood pressure is stable. She denies insomnia.  4. At risk for heart disease Alexis Jones is at a higher than average risk for cardiovascular disease due to obesity.   Assessment/Plan:   1. Vitamin D deficiency Low Vitamin D level contributes to fatigue and are associated with obesity, breast, and colon cancer. Shadiyah agreed to decrease prescription Vitamin D to 50,000 IU every 14 days, and we will recheck labs in 3 months. She will follow-up for routine testing of Vitamin D, at least 2-3 times per year to avoid over-replacement.  2. Other hyperlipidemia Cardiovascular risk  and specific lipid/LDL goals reviewed. We discussed several lifestyle modifications today. Alexis Jones was educated on what foods can increase her cholesterol levels and a transient elevation in LDL is common with weight loss. We will recheck labs in 3 months. She will continue to work on diet, exercise and weight loss efforts. Orders and follow up as documented in patient record.   3. Other depression with emotional eating Behavior modification techniques were discussed today to help Alexis Jones deal with her emotional/non-hunger eating behaviors. We will refill Wellbutrin SR for 1 month. Orders and follow up as documented in patient record.   - buPROPion (WELLBUTRIN SR) 150 MG 12 hr tablet; Take 1 tablet (150 mg total) by mouth 2 (two) times daily.  Dispense: 60 tablet; Refill: 0  4. At risk for heart disease Alexis Jones was given approximately 15 minutes of coronary artery disease prevention counseling today. She is 39 y.o. female and has risk factors for heart disease including obesity. We discussed intensive lifestyle modifications today with an emphasis on specific weight loss instructions and strategies.   Repetitive spaced learning was employed today to elicit superior memory formation and behavioral change.  5. Class 2 severe obesity with serious comorbidity and body mass index (BMI) of 37.0 to 37.9 in adult, unspecified obesity type (HCC) Alexis Jones is currently in the action stage of change. As such, her goal is to continue with weight loss efforts. She has agreed to following a lower carbohydrate, vegetable and lean protein rich diet plan.   Exercise goals: As is.  Behavioral modification strategies: holiday eating strategies .  Alexis Jones has agreed to follow-up with  our clinic in 4 weeks. She was informed of the importance of frequent follow-up visits to maximize her success with intensive lifestyle modifications for her multiple health conditions.   Objective:   Blood pressure 104/71,  pulse 88, temperature 98.1 F (36.7 C), height 5\' 6"  (1.676 m), weight 231 lb (104.8 kg), SpO2 94 %. Body mass index is 37.28 kg/m.  General: Cooperative, alert, well developed, in no acute distress. HEENT: Conjunctivae and lids unremarkable. Cardiovascular: Regular rhythm.  Lungs: Normal work of breathing. Neurologic: No focal deficits.   Lab Results  Component Value Date   CREATININE 0.89 01/21/2020   BUN 10 01/21/2020   NA 139 01/21/2020   K 4.3 01/21/2020   CL 103 01/21/2020   CO2 23 01/21/2020   Lab Results  Component Value Date   ALT 15 01/21/2020   AST 12 01/21/2020   ALKPHOS 67 01/21/2020   BILITOT 0.4 01/21/2020   Lab Results  Component Value Date   HGBA1C 5.4 01/21/2020   HGBA1C 5.4 09/19/2019   HGBA1C 5.5 05/09/2019   HGBA1C 5.3 12/28/2018   HGBA1C 5.8 (H) 05/17/2018   Lab Results  Component Value Date   INSULIN 7.4 01/21/2020   INSULIN 7.3 09/19/2019   INSULIN 17.8 05/09/2019   INSULIN 104.0 (H) 12/28/2018   INSULIN 13.0 05/17/2018   Lab Results  Component Value Date   TSH 2.010 12/28/2018   Lab Results  Component Value Date   CHOL 181 01/21/2020   HDL 36 (L) 01/21/2020   LDLCALC 131 (H) 01/21/2020   TRIG 77 01/21/2020   CHOLHDL 7.0 (H) 04/24/2018   Lab Results  Component Value Date   WBC 8.2 12/28/2018   HGB 12.7 12/28/2018   HCT 37.1 12/28/2018   MCV 84 12/28/2018   PLT 259 12/28/2018   Lab Results  Component Value Date   IRON 41 04/27/2018   TIBC 350 04/27/2018   FERRITIN 8 (L) 04/27/2018   Attestation Statements:   Reviewed by clinician on day of visit: allergies, medications, problem list, medical history, surgical history, family history, social history, and previous encounter notes.   I, Trixie Dredge, am acting as transcriptionist for Dennard Nip, MD.  I have reviewed the above documentation for accuracy and completeness, and I agree with the above. -  Dennard Nip, MD

## 2020-02-09 ENCOUNTER — Other Ambulatory Visit (INDEPENDENT_AMBULATORY_CARE_PROVIDER_SITE_OTHER): Payer: Self-pay | Admitting: Family Medicine

## 2020-02-09 DIAGNOSIS — E559 Vitamin D deficiency, unspecified: Secondary | ICD-10-CM

## 2020-02-11 ENCOUNTER — Encounter (INDEPENDENT_AMBULATORY_CARE_PROVIDER_SITE_OTHER): Payer: Self-pay

## 2020-02-11 NOTE — Telephone Encounter (Signed)
MyChart message sent to pt to find out if they have enough medication to get them through until next appt.   

## 2020-02-11 NOTE — Telephone Encounter (Signed)
Dr.Beasley 

## 2020-03-03 ENCOUNTER — Telehealth (INDEPENDENT_AMBULATORY_CARE_PROVIDER_SITE_OTHER): Payer: BC Managed Care – PPO | Admitting: Family Medicine

## 2020-03-03 ENCOUNTER — Encounter (INDEPENDENT_AMBULATORY_CARE_PROVIDER_SITE_OTHER): Payer: Self-pay | Admitting: Family Medicine

## 2020-03-03 DIAGNOSIS — Z6837 Body mass index (BMI) 37.0-37.9, adult: Secondary | ICD-10-CM | POA: Diagnosis not present

## 2020-03-03 DIAGNOSIS — E559 Vitamin D deficiency, unspecified: Secondary | ICD-10-CM | POA: Diagnosis not present

## 2020-03-03 DIAGNOSIS — E7849 Other hyperlipidemia: Secondary | ICD-10-CM | POA: Diagnosis not present

## 2020-03-03 MED ORDER — VITAMIN D (ERGOCALCIFEROL) 1.25 MG (50000 UNIT) PO CAPS: 50000.0000 [IU] | ORAL_CAPSULE | ORAL | 0 refills | Status: DC

## 2020-03-04 ENCOUNTER — Other Ambulatory Visit (INDEPENDENT_AMBULATORY_CARE_PROVIDER_SITE_OTHER): Payer: Self-pay | Admitting: Family Medicine

## 2020-03-04 ENCOUNTER — Encounter (INDEPENDENT_AMBULATORY_CARE_PROVIDER_SITE_OTHER): Payer: Self-pay

## 2020-03-04 DIAGNOSIS — F3289 Other specified depressive episodes: Secondary | ICD-10-CM

## 2020-03-04 NOTE — Telephone Encounter (Signed)
MyChart message sent to pt to find out if they have enough medication to get them through until next appt.   

## 2020-03-04 NOTE — Telephone Encounter (Signed)
Last OV with Dr. Beasley 

## 2020-03-05 NOTE — Progress Notes (Signed)
TeleHealth Visit:  Due to the COVID-19 pandemic, this visit was completed with telemedicine (audio/video) technology to reduce patient and provider exposure as well as to preserve personal protective equipment.   Alexis Jones has verbally consented to this TeleHealth visit. The patient is located at home, the provider is located at the [facility] office. The participants in this visit include the listed provider and patient. The visit was conducted today via MyChart video.   Chief Complaint: OBESITY Alexis Jones is here to discuss her progress with her obesity treatment plan along with follow-up of her obesity related diagnoses. Alexis Jones is on following a lower carbohydrate, vegetable and lean protein rich diet plan and states she is following her eating plan approximately 50% of the time. Alexis Jones states she is doing 0 minutes 0 times per week.  Today's visit was #: 33 Starting weight: 249 lbs Starting date: 05/17/2018  Interim History: Alexis Jones feels she gained approximately 2 lbs over the holidays. She has a lot of extra temptations over the holidays, but she has already gone shopping for low carbohydrate foods, and she is ready to get back on track. She also got a ski machine for Christmas, and she is looking forward to using it.  Subjective:   1. Vitamin D deficiency Alexis Jones is on Vit D prescription every 2 weeks. She is due to have labs done soon.  2. Other hyperlipidemia Alexis Jones's LDL is above goal, but she is working on diet and weight loss. She is not on a statin currently.  Assessment/Plan:   1. Vitamin D deficiency Low Vitamin D level contributes to fatigue and are associated with obesity, breast, and colon cancer. We will refill prescription Vitamin D for 1 month, and we will recheck labs at her next visit. Alexis Jones is to continue to take Vit D every 2 weeks. She will follow-up for routine testing of Vitamin D, at least 2-3 times per year to avoid  over-replacement.  - Vitamin D, Ergocalciferol, (DRISDOL) 1.25 MG (50000 UNIT) CAPS capsule; Take 1 capsule (50,000 Units total) by mouth every 7 (seven) days.  Dispense: 4 capsule; Refill: 0  2. Other hyperlipidemia Cardiovascular risk and specific lipid/LDL goals reviewed. We discussed several lifestyle modifications today. Alexis Jones will continue to work on diet, exercise and weight loss efforts. We will plan to recheck labs in approximately 1 month, and will continue to monitor in the meanwhile. Orders and follow up as documented in patient record.   3. Class 2 severe obesity with serious comorbidity and body mass index (BMI) of 37.0 to 37.9 in adult, unspecified obesity type (HCC) Alexis Jones is currently in the action stage of change. As such, her goal is to continue with weight loss efforts. She has agreed to following a lower carbohydrate, vegetable and lean protein rich diet plan.   Behavioral modification strategies: meal planning and cooking strategies.  Alexis Jones has agreed to follow-up with our clinic in 2 to 3 weeks. She was informed of the importance of frequent follow-up visits to maximize her success with intensive lifestyle modifications for her multiple health conditions.  Objective:   VITALS: Per patient if applicable, see vitals. GENERAL: Alert and in no acute distress. CARDIOPULMONARY: No increased WOB. Speaking in clear sentences.  PSYCH: Pleasant and cooperative. Speech normal rate and rhythm. Affect is appropriate. Insight and judgement are appropriate. Attention is focused, linear, and appropriate.  NEURO: Oriented as arrived to appointment on time with no prompting.   Lab Results  Component Value Date   CREATININE  0.89 01/21/2020   BUN 10 01/21/2020   NA 139 01/21/2020   K 4.3 01/21/2020   CL 103 01/21/2020   CO2 23 01/21/2020   Lab Results  Component Value Date   ALT 15 01/21/2020   AST 12 01/21/2020   ALKPHOS 67 01/21/2020   BILITOT 0.4 01/21/2020    Lab Results  Component Value Date   HGBA1C 5.4 01/21/2020   HGBA1C 5.4 09/19/2019   HGBA1C 5.5 05/09/2019   HGBA1C 5.3 12/28/2018   HGBA1C 5.8 (H) 05/17/2018   Lab Results  Component Value Date   INSULIN 7.4 01/21/2020   INSULIN 7.3 09/19/2019   INSULIN 17.8 05/09/2019   INSULIN 104.0 (H) 12/28/2018   INSULIN 13.0 05/17/2018   Lab Results  Component Value Date   TSH 2.010 12/28/2018   Lab Results  Component Value Date   CHOL 181 01/21/2020   HDL 36 (L) 01/21/2020   LDLCALC 131 (H) 01/21/2020   TRIG 77 01/21/2020   CHOLHDL 7.0 (H) 04/24/2018   Lab Results  Component Value Date   WBC 8.2 12/28/2018   HGB 12.7 12/28/2018   HCT 37.1 12/28/2018   MCV 84 12/28/2018   PLT 259 12/28/2018   Lab Results  Component Value Date   IRON 41 04/27/2018   TIBC 350 04/27/2018   FERRITIN 8 (L) 04/27/2018    Attestation Statements:   Reviewed by clinician on day of visit: allergies, medications, problem list, medical history, surgical history, family history, social history, and previous encounter notes.   I, Burt Knack, am acting as transcriptionist for Quillian Quince, MD.  I have reviewed the above documentation for accuracy and completeness, and I agree with the above. - Quillian Quince, MD

## 2020-03-20 ENCOUNTER — Encounter (INDEPENDENT_AMBULATORY_CARE_PROVIDER_SITE_OTHER): Payer: Self-pay | Admitting: Family Medicine

## 2020-03-24 ENCOUNTER — Other Ambulatory Visit: Payer: Self-pay

## 2020-03-24 ENCOUNTER — Encounter (INDEPENDENT_AMBULATORY_CARE_PROVIDER_SITE_OTHER): Payer: Self-pay | Admitting: Family Medicine

## 2020-03-24 ENCOUNTER — Telehealth (INDEPENDENT_AMBULATORY_CARE_PROVIDER_SITE_OTHER): Payer: BC Managed Care – PPO | Admitting: Family Medicine

## 2020-03-24 ENCOUNTER — Other Ambulatory Visit (INDEPENDENT_AMBULATORY_CARE_PROVIDER_SITE_OTHER): Payer: Self-pay | Admitting: Family Medicine

## 2020-03-24 DIAGNOSIS — Z6837 Body mass index (BMI) 37.0-37.9, adult: Secondary | ICD-10-CM | POA: Diagnosis not present

## 2020-03-24 DIAGNOSIS — U071 COVID-19: Secondary | ICD-10-CM | POA: Diagnosis not present

## 2020-03-24 DIAGNOSIS — E559 Vitamin D deficiency, unspecified: Secondary | ICD-10-CM

## 2020-03-24 NOTE — Progress Notes (Signed)
TeleHealth Visit:  Due to the COVID-19 pandemic, this visit was completed with telemedicine (audio/video) technology to reduce patient and provider exposure as well as to preserve personal protective equipment.   Alexis Jones has verbally consented to this TeleHealth visit. The patient is located at home, the provider is located at the [facility] office. The participants in this visit include the listed provider and patient. The visit was conducted today via MyChart video.   Chief Complaint: OBESITY Alexis Jones is here to discuss her progress with her obesity treatment plan along with follow-up of her obesity related diagnoses. Navi is on following a lower carbohydrate, vegetable and lean protein rich diet plan and states she is following her eating plan approximately 50% of the time. Alexis Jones states she is doing 0 minutes 0 times per week.  Today's visit was #: 34 Starting weight: 249 lbs Starting date: 05/17/2018  Interim History: Alexis Jones is currently sick at home with [place]. Her appetite is low and she has pharyngitis, so she is unable to eat a lot of protein.  Subjective:   1. COVID-19 virus infection Alexis Jones recently was diagnosed with COVID19. She notes fatigue, cough, and pharyngitis but no loss of taste or smell. She feels she got exposed from her children. She is isolating at home.  Assessment/Plan:   1. COVID-19 virus infection Alexis Jones was encouraged to increase her fluids and rest. She is not to worry about exercise and contact her primary care physician or myself if her symptoms worsen. We will continue to monitor. Orders and follow up as documented in patient record.  2. Class 2 severe obesity with serious comorbidity and body mass index (BMI) of 37.0 to 37.9 in adult, unspecified obesity type (HCC) Alexis Jones is currently in the action stage of change. As such, her goal is to continue with weight loss efforts. She has agreed to following a lower  carbohydrate, vegetable and lean protein rich diet plan.   Alexis Jones's goal is to restart the low carbohydrates plan after she has recovered. She will try to increase protein in the meanwhile.  Exercise goals: No exercise has been prescribed at this time.  Behavioral modification strategies: increasing lean protein intake and increasing water intake.  Alexis Jones has agreed to follow-up with our clinic in 2 to 3 weeks. She was informed of the importance of frequent follow-up visits to maximize her success with intensive lifestyle modifications for her multiple health conditions.  Objective:   VITALS: Per patient if applicable, see vitals. GENERAL: Alert and in no acute distress. CARDIOPULMONARY: No increased WOB. Speaking in clear sentences.  PSYCH: Pleasant and cooperative. Speech normal rate and rhythm. Affect is appropriate. Insight and judgement are appropriate. Attention is focused, linear, and appropriate.  NEURO: Oriented as arrived to appointment on time with no prompting.   Lab Results  Component Value Date   CREATININE 0.89 01/21/2020   BUN 10 01/21/2020   NA 139 01/21/2020   K 4.3 01/21/2020   CL 103 01/21/2020   CO2 23 01/21/2020   Lab Results  Component Value Date   ALT 15 01/21/2020   AST 12 01/21/2020   ALKPHOS 67 01/21/2020   BILITOT 0.4 01/21/2020   Lab Results  Component Value Date   HGBA1C 5.4 01/21/2020   HGBA1C 5.4 09/19/2019   HGBA1C 5.5 05/09/2019   HGBA1C 5.3 12/28/2018   HGBA1C 5.8 (H) 05/17/2018   Lab Results  Component Value Date   INSULIN 7.4 01/21/2020   INSULIN 7.3 09/19/2019   INSULIN  17.8 05/09/2019   INSULIN 104.0 (H) 12/28/2018   INSULIN 13.0 05/17/2018   Lab Results  Component Value Date   TSH 2.010 12/28/2018   Lab Results  Component Value Date   CHOL 181 01/21/2020   HDL 36 (L) 01/21/2020   LDLCALC 131 (H) 01/21/2020   TRIG 77 01/21/2020   CHOLHDL 7.0 (H) 04/24/2018   Lab Results  Component Value Date   WBC 8.2  12/28/2018   HGB 12.7 12/28/2018   HCT 37.1 12/28/2018   MCV 84 12/28/2018   PLT 259 12/28/2018   Lab Results  Component Value Date   IRON 41 04/27/2018   TIBC 350 04/27/2018   FERRITIN 8 (L) 04/27/2018    Attestation Statements:   Reviewed by clinician on day of visit: allergies, medications, problem list, medical history, surgical history, family history, social history, and previous encounter notes.   I, Trixie Dredge, am acting as transcriptionist for Dennard Nip, MD.  I have reviewed the above documentation for accuracy and completeness, and I agree with the above. - Dennard Nip, MD

## 2020-03-25 ENCOUNTER — Encounter (INDEPENDENT_AMBULATORY_CARE_PROVIDER_SITE_OTHER): Payer: Self-pay | Admitting: Family Medicine

## 2020-04-14 ENCOUNTER — Ambulatory Visit (INDEPENDENT_AMBULATORY_CARE_PROVIDER_SITE_OTHER): Payer: BC Managed Care – PPO | Admitting: Family Medicine

## 2020-04-14 ENCOUNTER — Other Ambulatory Visit: Payer: Self-pay

## 2020-04-14 ENCOUNTER — Encounter (INDEPENDENT_AMBULATORY_CARE_PROVIDER_SITE_OTHER): Payer: Self-pay | Admitting: Family Medicine

## 2020-04-14 VITALS — BP 103/65 | HR 89 | Temp 98.2°F | Ht 66.0 in | Wt 232.0 lb

## 2020-04-14 DIAGNOSIS — E559 Vitamin D deficiency, unspecified: Secondary | ICD-10-CM

## 2020-04-14 DIAGNOSIS — Z9189 Other specified personal risk factors, not elsewhere classified: Secondary | ICD-10-CM

## 2020-04-14 DIAGNOSIS — F3289 Other specified depressive episodes: Secondary | ICD-10-CM

## 2020-04-14 DIAGNOSIS — Z6837 Body mass index (BMI) 37.0-37.9, adult: Secondary | ICD-10-CM

## 2020-04-14 MED ORDER — BUPROPION HCL ER (SR) 150 MG PO TB12: 150.0000 mg | ORAL_TABLET | Freq: Two times a day (BID) | ORAL | 0 refills | Status: DC

## 2020-04-14 MED ORDER — VITAMIN D (ERGOCALCIFEROL) 1.25 MG (50000 UNIT) PO CAPS: 50000.0000 [IU] | ORAL_CAPSULE | ORAL | 0 refills | Status: DC

## 2020-04-16 NOTE — Progress Notes (Signed)
Chief Complaint:   OBESITY Alexis Jones is here to discuss her progress with her obesity treatment plan along with follow-up of her obesity related diagnoses. Alexis Jones is on following a lower carbohydrate, vegetable and lean protein rich diet plan and states she is following her eating plan approximately 50% of the time. Alexis Jones states she is doing 0 minutes 0 times per week.  Today's visit was #: 70 Starting weight: 249 lbs Starting date: 05/17/2018 Today's weight: 232 lbs Today's date: 04/14/2020 Total lbs lost to date: 17 Total lbs lost since last in-office visit: 0  Interim History: Alexis Jones has been recovering from [place] and she feels better, but she still notes some fatigue. She has struggled with the low carbohydrate plan, and she is open to looking at other options.  Subjective:   1. Vitamin D deficiency Alexis Jones is stable on Vit D, and she requests a refill today. She denies signs of over-replacement.  2. Other depression with emotional eating Alexis Jones is tolerating Wellbutrin, and she denies side effects and her blood pressure is well controlled. She is working on decreasing emotional eating behaviors, but this has been especially difficult while recovering from [place].  3. At risk for activity intolerance Alexis Jones is at risk for exercise intolerance due to decreased activity.  Assessment/Plan:   1. Vitamin D deficiency Low Vitamin D level contributes to fatigue and are associated with obesity, breast, and colon cancer. We will refill prescription Vitamin D for 1 month. Chen will follow-up for routine testing of Vitamin D, at least 2-3 times per year to avoid over-replacement.  2. Other depression with emotional eating Emotional eating strategies were discussed today. [place] will continue Wellbutrin SR and we will refill for 1 month. Orders and follow up as documented in patient record.   - buPROPion (WELLBUTRIN SR) 150 MG 12 hr tablet; Take 1 tablet  (150 mg total) by mouth 2 (two) times daily.  Dispense: 60 tablet; Refill: 0  3. At risk for activity intolerance Alexis Jones was given approximately 15 minutes of exercise intolerance counseling today. She is 40 y.o. female and has risk factors exercise intolerance including obesity. We discussed intensive lifestyle modifications today with an emphasis on specific weight loss instructions and strategies. Alexis Jones will slowly increase activity as tolerated.  Repetitive spaced learning was employed today to elicit superior memory formation and behavioral change.  4. Class 2 severe obesity with serious comorbidity and body mass index (BMI) of 37.0 to 37.9 in adult, unspecified obesity type (HCC) Alexis Jones is currently in the action stage of change. As such, her goal is to continue with weight loss efforts. She has agreed to change to keeping a food journal and adhering to recommended goals of 1200-1400 calories and 80+ grams of protein daily.   Behavioral modification strategies: meal planning and cooking strategies and keeping a strict food journal.  Alexis Jones has agreed to follow-up with our clinic in 3 weeks. She was informed of the importance of frequent follow-up visits to maximize her success with intensive lifestyle modifications for her multiple health conditions.   Objective:   Blood pressure 103/65, pulse 89, temperature 98.2 F (36.8 C), height 5\' 6"  (1.676 m), weight 232 lb (105.2 kg), SpO2 97 %. Body mass index is 37.45 kg/m.  General: Cooperative, alert, well developed, in no acute distress. HEENT: Conjunctivae and lids unremarkable. Cardiovascular: Regular rhythm.  Lungs: Normal work of breathing. Neurologic: No focal deficits.   Lab Results  Component Value Date   CREATININE 0.89 01/21/2020  BUN 10 01/21/2020   NA 139 01/21/2020   K 4.3 01/21/2020   CL 103 01/21/2020   CO2 23 01/21/2020   Lab Results  Component Value Date   ALT 15 01/21/2020   AST 12 01/21/2020    ALKPHOS 67 01/21/2020   BILITOT 0.4 01/21/2020   Lab Results  Component Value Date   HGBA1C 5.4 01/21/2020   HGBA1C 5.4 09/19/2019   HGBA1C 5.5 05/09/2019   HGBA1C 5.3 12/28/2018   HGBA1C 5.8 (H) 05/17/2018   Lab Results  Component Value Date   INSULIN 7.4 01/21/2020   INSULIN 7.3 09/19/2019   INSULIN 17.8 05/09/2019   INSULIN 104.0 (H) 12/28/2018   INSULIN 13.0 05/17/2018   Lab Results  Component Value Date   TSH 2.010 12/28/2018   Lab Results  Component Value Date   CHOL 181 01/21/2020   HDL 36 (L) 01/21/2020   LDLCALC 131 (H) 01/21/2020   TRIG 77 01/21/2020   CHOLHDL 7.0 (H) 04/24/2018   Lab Results  Component Value Date   WBC 8.2 12/28/2018   HGB 12.7 12/28/2018   HCT 37.1 12/28/2018   MCV 84 12/28/2018   PLT 259 12/28/2018   Lab Results  Component Value Date   IRON 41 04/27/2018   TIBC 350 04/27/2018   FERRITIN 8 (L) 04/27/2018   Attestation Statements:   Reviewed by clinician on day of visit: allergies, medications, problem list, medical history, surgical history, family history, social history, and previous encounter notes.   I, Trixie Dredge, am acting as transcriptionist for Dennard Nip, MD.  I have reviewed the above documentation for accuracy and completeness, and I agree with the above. -  Dennard Nip, MD

## 2020-05-08 ENCOUNTER — Ambulatory Visit (INDEPENDENT_AMBULATORY_CARE_PROVIDER_SITE_OTHER): Payer: BC Managed Care – PPO | Admitting: Physician Assistant

## 2020-05-27 ENCOUNTER — Ambulatory Visit (INDEPENDENT_AMBULATORY_CARE_PROVIDER_SITE_OTHER): Payer: BC Managed Care – PPO | Admitting: Family Medicine

## 2020-05-27 ENCOUNTER — Other Ambulatory Visit: Payer: Self-pay

## 2020-05-27 VITALS — BP 125/82 | HR 73 | Temp 98.2°F | Ht 66.0 in | Wt 231.0 lb

## 2020-05-27 DIAGNOSIS — Z6841 Body Mass Index (BMI) 40.0 and over, adult: Secondary | ICD-10-CM

## 2020-05-27 DIAGNOSIS — E559 Vitamin D deficiency, unspecified: Secondary | ICD-10-CM

## 2020-05-27 DIAGNOSIS — Z9189 Other specified personal risk factors, not elsewhere classified: Secondary | ICD-10-CM | POA: Diagnosis not present

## 2020-05-27 MED ORDER — VITAMIN D (ERGOCALCIFEROL) 1.25 MG (50000 UNIT) PO CAPS: 50000.0000 [IU] | ORAL_CAPSULE | ORAL | 0 refills | Status: DC

## 2020-06-02 NOTE — Progress Notes (Signed)
Chief Complaint:   OBESITY Alexis Jones is here to discuss her progress with her obesity treatment plan along with follow-up of her obesity related diagnoses. Alexis Jones is on keeping a food journal and adhering to recommended goals of 1200-1400 calories and 80+ grams of protein daily and states she is following her eating plan approximately 30-40% of the time. Alexis Jones states she is doing yard work (all day on Saturday) 1 time per week.  Today's visit was #: 64 Starting weight: 249 lbs Starting date: 05/17/2018 Today's weight: 231 lbs Today's date: 05/27/2020 Total lbs lost to date: 18 Total lbs lost since last in-office visit: 1  Interim History: Alexis Jones continues to do well with weight loss. She is working on meeting her protein goals, especially eating in the day. Her hunger is mostly controlled.  Subjective:   1. Vitamin D deficiency Alexis Jones is stable on Vit D, and she is at high risk of over-replacement.  2. At risk for nausea Alexis Jones is at risk for nausea due to if Vit D is over-replaced.  Assessment/Plan:   1. Vitamin D deficiency Low Vitamin D level contributes to fatigue and are associated with obesity, breast, and colon cancer. We will refill prescription Vitamin D for 1 month, and we will recheck labs at her next visit. Alexis Jones will follow-up for routine testing of Vitamin D, at least 2-3 times per year to avoid over-replacement.  - Vitamin D, Ergocalciferol, (DRISDOL) 1.25 MG (50000 UNIT) CAPS capsule; Take 1 capsule (50,000 Units total) by mouth every 14 (fourteen) days.  Dispense: 2 capsule; Refill: 0  2. At risk for nausea Alexis Jones was given approximately 15 minutes of nausea prevention counseling today. Alexis Jones is at risk for nausea due to her new or current medication. She was encouraged to titrate her medication slowly, make sure to stay hydrated, eat smaller portions throughout the day, and avoid high fat meals.   3. Obesity with current  BMI of 37.3  Alexis Jones is currently in the action stage of change. As such, her goal is to continue with weight loss efforts. She has agreed to keeping a food journal and adhering to recommended goals of 1200-1400 calories and 90+ grams of protein daily.   We will recheck fasting labs at her visit.  Exercise goals: As is.  Behavioral modification strategies: increasing lean protein intake and decreasing simple carbohydrates.  Alexis Jones has agreed to follow-up with our clinic in 4 weeks. She was informed of the importance of frequent follow-up visits to maximize her success with intensive lifestyle modifications for her multiple health conditions.   Objective:   Blood pressure 125/82, pulse 73, temperature 98.2 F (36.8 C), height 5\' 6"  (1.676 m), weight 231 lb (104.8 kg), SpO2 97 %. Body mass index is 37.28 kg/m.  General: Cooperative, alert, well developed, in no acute distress. HEENT: Conjunctivae and lids unremarkable. Cardiovascular: Regular rhythm.  Lungs: Normal work of breathing. Neurologic: No focal deficits.   Lab Results  Component Value Date   CREATININE 0.89 01/21/2020   BUN 10 01/21/2020   NA 139 01/21/2020   K 4.3 01/21/2020   CL 103 01/21/2020   CO2 23 01/21/2020   Lab Results  Component Value Date   ALT 15 01/21/2020   AST 12 01/21/2020   ALKPHOS 67 01/21/2020   BILITOT 0.4 01/21/2020   Lab Results  Component Value Date   HGBA1C 5.4 01/21/2020   HGBA1C 5.4 09/19/2019   HGBA1C 5.5 05/09/2019   HGBA1C 5.3 12/28/2018   HGBA1C  5.8 (H) 05/17/2018   Lab Results  Component Value Date   INSULIN 7.4 01/21/2020   INSULIN 7.3 09/19/2019   INSULIN 17.8 05/09/2019   INSULIN 104.0 (H) 12/28/2018   INSULIN 13.0 05/17/2018   Lab Results  Component Value Date   TSH 2.010 12/28/2018   Lab Results  Component Value Date   CHOL 181 01/21/2020   HDL 36 (L) 01/21/2020   LDLCALC 131 (H) 01/21/2020   TRIG 77 01/21/2020   CHOLHDL 7.0 (H) 04/24/2018   Lab  Results  Component Value Date   WBC 8.2 12/28/2018   HGB 12.7 12/28/2018   HCT 37.1 12/28/2018   MCV 84 12/28/2018   PLT 259 12/28/2018   Lab Results  Component Value Date   IRON 41 04/27/2018   TIBC 350 04/27/2018   FERRITIN 8 (L) 04/27/2018   Attestation Statements:   Reviewed by clinician on day of visit: allergies, medications, problem list, medical history, surgical history, family history, social history, and previous encounter notes.   I, Trixie Dredge, am acting as transcriptionist for Dennard Nip, MD.  I have reviewed the above documentation for accuracy and completeness, and I agree with the above. -  Dennard Nip, MD

## 2020-06-22 ENCOUNTER — Other Ambulatory Visit: Payer: Self-pay | Admitting: Obstetrics & Gynecology

## 2020-06-23 ENCOUNTER — Other Ambulatory Visit: Payer: Self-pay

## 2020-06-23 ENCOUNTER — Ambulatory Visit (INDEPENDENT_AMBULATORY_CARE_PROVIDER_SITE_OTHER): Payer: BC Managed Care – PPO | Admitting: Family Medicine

## 2020-06-23 ENCOUNTER — Other Ambulatory Visit (INDEPENDENT_AMBULATORY_CARE_PROVIDER_SITE_OTHER): Payer: Self-pay | Admitting: Family Medicine

## 2020-06-23 VITALS — BP 119/79 | HR 74 | Temp 97.9°F | Ht 66.0 in | Wt 235.0 lb

## 2020-06-23 DIAGNOSIS — E559 Vitamin D deficiency, unspecified: Secondary | ICD-10-CM

## 2020-06-23 DIAGNOSIS — R7303 Prediabetes: Secondary | ICD-10-CM | POA: Diagnosis not present

## 2020-06-23 DIAGNOSIS — E7849 Other hyperlipidemia: Secondary | ICD-10-CM

## 2020-06-23 DIAGNOSIS — F3289 Other specified depressive episodes: Secondary | ICD-10-CM

## 2020-06-23 DIAGNOSIS — Z9189 Other specified personal risk factors, not elsewhere classified: Secondary | ICD-10-CM | POA: Diagnosis not present

## 2020-06-23 DIAGNOSIS — Z6841 Body Mass Index (BMI) 40.0 and over, adult: Secondary | ICD-10-CM

## 2020-06-23 NOTE — Telephone Encounter (Signed)
Dr.Beasley 

## 2020-06-24 LAB — CMP14+EGFR
ALT: 15 IU/L (ref 0–32)
AST: 15 IU/L (ref 0–40)
Albumin/Globulin Ratio: 2 (ref 1.2–2.2)
Albumin: 4.3 g/dL (ref 3.8–4.8)
Alkaline Phosphatase: 66 IU/L (ref 44–121)
BUN/Creatinine Ratio: 11 (ref 9–23)
BUN: 8 mg/dL (ref 6–24)
Bilirubin Total: 0.4 mg/dL (ref 0.0–1.2)
CO2: 22 mmol/L (ref 20–29)
Calcium: 8.9 mg/dL (ref 8.7–10.2)
Chloride: 103 mmol/L (ref 96–106)
Creatinine, Ser: 0.75 mg/dL (ref 0.57–1.00)
Globulin, Total: 2.2 g/dL (ref 1.5–4.5)
Glucose: 81 mg/dL (ref 65–99)
Potassium: 3.8 mmol/L (ref 3.5–5.2)
Sodium: 138 mmol/L (ref 134–144)
Total Protein: 6.5 g/dL (ref 6.0–8.5)
eGFR: 102 mL/min/{1.73_m2} (ref >59)

## 2020-06-24 LAB — INSULIN, RANDOM: INSULIN: 9.1 u[IU]/mL (ref 2.6–24.9)

## 2020-06-24 LAB — LIPID PANEL WITH LDL/HDL RATIO
Cholesterol, Total: 200 mg/dL — ABNORMAL HIGH (ref 100–199)
HDL: 37 mg/dL — ABNORMAL LOW (ref >39)
LDL Chol Calc (NIH): 137 mg/dL — ABNORMAL HIGH (ref 0–99)
LDL/HDL Ratio: 3.7 ratio — ABNORMAL HIGH (ref 0.0–3.2)
Triglycerides: 144 mg/dL (ref 0–149)
VLDL Cholesterol Cal: 26 mg/dL (ref 5–40)

## 2020-06-24 LAB — VITAMIN D 25 HYDROXY (VIT D DEFICIENCY, FRACTURES): Vit D, 25-Hydroxy: 46.3 ng/mL (ref 30.0–100.0)

## 2020-06-24 LAB — HEMOGLOBIN A1C
Est. average glucose Bld gHb Est-mCnc: 105 mg/dL
Hgb A1c MFr Bld: 5.3 % (ref 4.8–5.6)

## 2020-06-24 MED ORDER — BUPROPION HCL ER (SR) 150 MG PO TB12: 150.0000 mg | ORAL_TABLET | Freq: Two times a day (BID) | ORAL | 0 refills | Status: DC

## 2020-06-24 NOTE — Progress Notes (Signed)
Chief Complaint:   OBESITY Alexis Jones is here to discuss her progress with her obesity treatment plan along with follow-up of her obesity related diagnoses. Alexis Jones is on keeping a food journal and adhering to recommended goals of 1200-1400 calories and 90+ grams of protein daily and states she is following her eating plan approximately 80% of the time. Alexis Jones states she is walking outside and bike riding for 30 minutes 2 times per week.  Today's visit was #: 29 Starting weight: 249 lbs Starting date: 05/17/2018 Today's weight: 235 lbs Today's date: 06/23/2020 Total lbs lost to date: 14 Total lbs lost since last in-office visit: 0  Interim History: Alexis Jones did some celebration eating over Easter, but the holiday candy is now out of the house, and she is getting back on track with diet and exercise. Her weight gain today appears to water weight.  Subjective:   1. Vitamin D deficiency Alexis Jones's last Vit D level was at goal, and so she is at high risk of over-replacement. She denies nausea, vomiting, or muscle weakness.  2. Other hyperlipidemia Alexis Jones is working on diet and exercise, and she is due for labs.  3. Pre-diabetes Alexis Jones is working on diet and exercise, and she is due for labs.  4. Other depression with emotional eating Alexis Jones's mood is stable on her medications. She has increased temptation, but has been working on decreased emotional eating behaviors.   5. At risk for impaired metabolic function Alexis Jones is at increased risk for impaired metabolic function if protein decreases.  Assessment/Plan:   1. Vitamin D deficiency Low Vitamin D level contributes to fatigue and are associated with obesity, breast, and colon cancer. We will check labs today, and we will refill prescription Vitamin D 50,000 IU every 14 days #2 for 1 month. Alexis Jones will follow-up for routine testing of Vitamin D, at least 2-3 times per year to avoid over-replacement.  -  VITAMIN D 25 Hydroxy (Vit-D Deficiency, Fractures)  2. Other hyperlipidemia Cardiovascular risk and specific lipid/LDL goals reviewed. We discussed several lifestyle modifications today. We wil check labs today. Alexis Jones will continue to work on diet, exercise and weight loss efforts. Orders and follow up as documented in patient record.   - Lipid Panel With LDL/HDL Ratio  3. Pre-diabetes Alexis Jones will continue to work on weight loss, diet, exercise, and decreasing simple carbohydrates to help decrease the risk of diabetes. We will check labs today.  - CMP14+EGFR - Hemoglobin A1c - Insulin, random  4. Other depression with emotional eating Behavior modification techniques were discussed today to help Alexis Jones deal with her emotional/non-hunger eating behaviors. We will refill Wellbutrin SR 150 mg BID #60 for 1 month. Orders and follow up as documented in patient record.   5. At risk for impaired metabolic function Alexis Jones was given approximately 15 minutes of impaired  metabolic function prevention counseling today. We discussed intensive lifestyle modifications today with an emphasis on specific nutrition and exercise instructions and strategies.   Repetitive spaced learning was employed today to elicit superior memory formation and behavioral change.  6. Class 3 severe obesity with serious comorbidity and body mass index (BMI) of 40.0 to 44.9 in adult, unspecified obesity type (HCC) Alexis Jones is currently in the action stage of change. As such, her goal is to continue with weight loss efforts. She has agreed to keeping a food journal and adhering to recommended goals of 1200-1400 calories and 90+ grams of protein daily.   Exercise goals: As is.  Behavioral modification  strategies: decreasing simple carbohydrates and meal planning and cooking strategies.  Alexis Jones has agreed to follow-up with our clinic in 3 to 4 weeks. She was informed of the importance of frequent follow-up  visits to maximize her success with intensive lifestyle modifications for her multiple health conditions.   Alexis Jones was informed we would discuss her lab results at her next visit unless there is a critical issue that needs to be addressed sooner. Alexis Jones agreed to keep her next visit at the agreed upon time to discuss these results.  Objective:   Blood pressure 119/79, pulse 74, temperature 97.9 F (36.6 C), height _0  (1.676 m), weight 235 lb (106.6 kg), SpO2 99 %. Body mass index is 37.93 kg/m.  General: Cooperative, alert, well developed, in no acute distress. HEENT: Conjunctivae and lids unremarkable. Cardiovascular: Regular rhythm.  Lungs: Normal work of breathing. Neurologic: No focal deficits.   Lab Results  Component Value Date   CREATININE 0.75 06/23/2020   BUN 8 06/23/2020   NA 138 06/23/2020   K 3.8 06/23/2020   CL 103 06/23/2020   CO2 22 06/23/2020   Lab Results  Component Value Date   ALT 15 06/23/2020   AST 15 06/23/2020   ALKPHOS 66 06/23/2020   BILITOT 0.4 06/23/2020   Lab Results  Component Value Date   HGBA1C 5.3 06/23/2020   HGBA1C 5.4 01/21/2020   HGBA1C 5.4 09/19/2019   HGBA1C 5.5 05/09/2019   HGBA1C 5.3 12/28/2018   Lab Results  Component Value Date   INSULIN 9.1 06/23/2020   INSULIN 7.4 01/21/2020   INSULIN 7.3 09/19/2019   INSULIN 17.8 05/09/2019   INSULIN 104.0 (H) 12/28/2018   Lab Results  Component Value Date   TSH 2.010 12/28/2018   Lab Results  Component Value Date   CHOL 200 (H) 06/23/2020   HDL 37 (L) 06/23/2020   LDLCALC 137 (H) 06/23/2020   TRIG 144 06/23/2020   CHOLHDL 7.0 (H) 04/24/2018   Lab Results  Component Value Date   WBC 8.2 12/28/2018   HGB 12.7 12/28/2018   HCT 37.1 12/28/2018   MCV 84 12/28/2018   PLT 259 12/28/2018   Lab Results  Component Value Date   IRON 41 04/27/2018   TIBC 350 04/27/2018   FERRITIN 8 (L) 04/27/2018   Attestation Statements:   Reviewed by clinician on day of visit:  allergies, medications, problem list, medical history, surgical history, family history, social history, and previous encounter notes.   I, Trixie Dredge, am acting as transcriptionist for Dennard Nip, MD.  I have reviewed the above documentation for accuracy and completeness, and I agree with the above. -  Dennard Nip, MD

## 2020-06-25 ENCOUNTER — Encounter (INDEPENDENT_AMBULATORY_CARE_PROVIDER_SITE_OTHER): Payer: Self-pay | Admitting: Family Medicine

## 2020-06-25 NOTE — Telephone Encounter (Signed)
Would you like to refill? Pt's next appt is at the end of May.

## 2020-06-25 NOTE — Telephone Encounter (Signed)
Ok to rf x 1

## 2020-06-25 NOTE — Telephone Encounter (Signed)
Ok x 1

## 2020-07-21 ENCOUNTER — Other Ambulatory Visit (INDEPENDENT_AMBULATORY_CARE_PROVIDER_SITE_OTHER): Payer: Self-pay | Admitting: Family Medicine

## 2020-07-21 DIAGNOSIS — E559 Vitamin D deficiency, unspecified: Secondary | ICD-10-CM

## 2020-07-22 ENCOUNTER — Encounter (INDEPENDENT_AMBULATORY_CARE_PROVIDER_SITE_OTHER): Payer: Self-pay | Admitting: Family Medicine

## 2020-07-22 ENCOUNTER — Other Ambulatory Visit: Payer: Self-pay

## 2020-07-22 ENCOUNTER — Other Ambulatory Visit (INDEPENDENT_AMBULATORY_CARE_PROVIDER_SITE_OTHER): Payer: Self-pay | Admitting: Family Medicine

## 2020-07-22 ENCOUNTER — Ambulatory Visit (INDEPENDENT_AMBULATORY_CARE_PROVIDER_SITE_OTHER): Payer: BC Managed Care – PPO | Admitting: Family Medicine

## 2020-07-22 VITALS — BP 120/74 | HR 70 | Temp 98.1°F | Ht 66.0 in | Wt 234.0 lb

## 2020-07-22 DIAGNOSIS — Z6841 Body Mass Index (BMI) 40.0 and over, adult: Secondary | ICD-10-CM | POA: Diagnosis not present

## 2020-07-22 DIAGNOSIS — E559 Vitamin D deficiency, unspecified: Secondary | ICD-10-CM

## 2020-07-22 DIAGNOSIS — F3289 Other specified depressive episodes: Secondary | ICD-10-CM

## 2020-07-22 DIAGNOSIS — Z9189 Other specified personal risk factors, not elsewhere classified: Secondary | ICD-10-CM

## 2020-07-22 NOTE — Telephone Encounter (Signed)
Patient is requesting a refill of the following medications: Requested Prescriptions          Pending Prescriptions Disp Refills   buPROPion (WELLBUTRIN SR) 150 MG 12 hr tablet [Pharmacy Med Name: BUPROPION HCL SR 150 MG TABLET] 60 tablet 0      Sig: TAKE 1 TABLET BY MOUTH TWICE A DAY    Last office visit: 06/23/20 Date of last refill: 06/24/20 Last refill amount: 60 Follow up time period per chart: 4 week Next appt : 07/22/20

## 2020-07-22 NOTE — Telephone Encounter (Signed)
Patient is requesting a refill of the following medications: Requested Prescriptions   Pending Prescriptions Disp Refills   buPROPion (WELLBUTRIN SR) 150 MG 12 hr tablet [Pharmacy Med Name: BUPROPION HCL SR 150 MG TABLET] 60 tablet 0    Sig: TAKE 1 TABLET BY MOUTH TWICE A DAY   Last office visit: 06/23/20 Date of last refill: 06/24/20 Last refill amount: 60 Follow up time period per chart: 4 week Next appt : 07/22/20

## 2020-07-23 MED ORDER — BUPROPION HCL ER (SR) 150 MG PO TB12: 150.0000 mg | ORAL_TABLET | Freq: Two times a day (BID) | ORAL | 0 refills | Status: DC

## 2020-07-23 MED ORDER — VITAMIN D (ERGOCALCIFEROL) 1.25 MG (50000 UNIT) PO CAPS: 50000.0000 [IU] | ORAL_CAPSULE | ORAL | 0 refills | Status: DC

## 2020-07-24 NOTE — Progress Notes (Signed)
Chief Complaint:   OBESITY Alexis Jones is here to discuss her progress with her obesity treatment plan along with follow-up of her obesity related diagnoses. Alexis Jones is on keeping a food journal and adhering to recommended goals of 1200-1400 calories and 90+ grams of protein daily and states she is following her eating plan approximately 50% of the time. Alexis Jones states she is biking, gardening, and painting for 30-60 minutes 2 times per week.  Today's visit was #: 52 Starting weight: 249 lbs Starting date: 05/17/2018 Today's weight: 234 lbs Today's date: 07/22/2020 Total lbs lost to date: 15 Total lbs lost since last in-office visit: 1  Interim History: Alexis Jones continue to do well with weight loss. She has been exercising more and working on journaling. She is open to discussing more protein rich food options especially for breakfast.  Subjective:   1. Vitamin D deficiency Alexis Jones is stable on Vit D, and she requests a refill. She denies nausea, vomiting, or muscle weakness.  2. Other depression with emotional eating Alexis Jones is stable on Wellbutrin. She is doing well with minimizing emotional eating behaviors.  3. At risk for impaired metabolic function Alexis Jones is at increased risk for impaired metabolic function if protein decreases.  Assessment/Plan:   1. Vitamin D deficiency Low Vitamin D level contributes to fatigue and are associated with obesity, breast, and colon cancer. We will refill prescription Vitamin D 50,000 IU every 14 days for 1 month. Alexis Jones will follow-up for routine testing of Vitamin D, at least 2-3 times per year to avoid over-replacement.  - Vitamin D, Ergocalciferol, (DRISDOL) 1.25 MG (50000 UNIT) CAPS capsule; Take 1 capsule (50,000 Units total) by mouth every 14 (fourteen) days.  Dispense: 2 capsule; Refill: 0  2. Other depression with emotional eating Behavior modification techniques were discussed today to help Alexis Jones deal with her  emotional/non-hunger eating behaviors. We will refill Wellbutrin SR for 1 month. Orders and follow up as documented in patient record.   - buPROPion (WELLBUTRIN SR) 150 MG 12 hr tablet; Take 1 tablet (150 mg total) by mouth 2 (two) times daily.  Dispense: 60 tablet; Refill: 0  3. At risk for impaired metabolic function Alexis Jones was given approximately 15 minutes of impaired  metabolic function prevention counseling today. We discussed intensive lifestyle modifications today with an emphasis on specific nutrition and exercise instructions and strategies.   Repetitive spaced learning was employed today to elicit superior memory formation and behavioral change.  4. Obesity with current BMI 37.8 Alexis Jones is currently in the action stage of change. As such, her goal is to continue with weight loss efforts. She has agreed to keeping a food journal and adhering to recommended goals of 1200-1400 calories and 90 grams of protein daily.   Exercise goals: As is.  Behavioral modification strategies: increasing lean protein intake.  Alexis Jones has agreed to follow-up with our clinic in 3 weeks. She was informed of the importance of frequent follow-up visits to maximize her success with intensive lifestyle modifications for her multiple health conditions.   Objective:   Blood pressure 120/74, pulse 70, temperature 98.1 F (36.7 C), height 5\' 6"  (1.676 m), weight 234 lb (106.1 kg), SpO2 98 %. Body mass index is 37.77 kg/m.  General: Cooperative, alert, well developed, in no acute distress. HEENT: Conjunctivae and lids unremarkable. Cardiovascular: Regular rhythm.  Lungs: Normal work of breathing. Neurologic: No focal deficits.   Lab Results  Component Value Date   CREATININE 0.75 06/23/2020   BUN 8 06/23/2020  NA 138 06/23/2020   K 3.8 06/23/2020   CL 103 06/23/2020   CO2 22 06/23/2020   Lab Results  Component Value Date   ALT 15 06/23/2020   AST 15 06/23/2020   ALKPHOS 66 06/23/2020    BILITOT 0.4 06/23/2020   Lab Results  Component Value Date   HGBA1C 5.3 06/23/2020   HGBA1C 5.4 01/21/2020   HGBA1C 5.4 09/19/2019   HGBA1C 5.5 05/09/2019   HGBA1C 5.3 12/28/2018   Lab Results  Component Value Date   INSULIN 9.1 06/23/2020   INSULIN 7.4 01/21/2020   INSULIN 7.3 09/19/2019   INSULIN 17.8 05/09/2019   INSULIN 104.0 (H) 12/28/2018   Lab Results  Component Value Date   TSH 2.010 12/28/2018   Lab Results  Component Value Date   CHOL 200 (H) 06/23/2020   HDL 37 (L) 06/23/2020   LDLCALC 137 (H) 06/23/2020   TRIG 144 06/23/2020   CHOLHDL 7.0 (H) 04/24/2018   Lab Results  Component Value Date   WBC 8.2 12/28/2018   HGB 12.7 12/28/2018   HCT 37.1 12/28/2018   MCV 84 12/28/2018   PLT 259 12/28/2018   Lab Results  Component Value Date   IRON 41 04/27/2018   TIBC 350 04/27/2018   FERRITIN 8 (L) 04/27/2018   Attestation Statements:   Reviewed by clinician on day of visit: allergies, medications, problem list, medical history, surgical history, family history, social history, and previous encounter notes.   I, Trixie Dredge, am acting as transcriptionist for Dennard Nip, MD.  I have reviewed the above documentation for accuracy and completeness, and I agree with the above. -  Dennard Nip, MD

## 2020-08-08 ENCOUNTER — Other Ambulatory Visit: Payer: Self-pay

## 2020-08-12 ENCOUNTER — Encounter (INDEPENDENT_AMBULATORY_CARE_PROVIDER_SITE_OTHER): Payer: Self-pay | Admitting: Family Medicine

## 2020-08-12 ENCOUNTER — Ambulatory Visit (INDEPENDENT_AMBULATORY_CARE_PROVIDER_SITE_OTHER): Payer: BC Managed Care – PPO | Admitting: Family Medicine

## 2020-08-12 ENCOUNTER — Other Ambulatory Visit: Payer: Self-pay

## 2020-08-12 VITALS — BP 113/72 | HR 74 | Temp 97.9°F | Ht 66.0 in | Wt 232.0 lb

## 2020-08-12 DIAGNOSIS — E559 Vitamin D deficiency, unspecified: Secondary | ICD-10-CM | POA: Diagnosis not present

## 2020-08-12 DIAGNOSIS — Z6841 Body Mass Index (BMI) 40.0 and over, adult: Secondary | ICD-10-CM | POA: Diagnosis not present

## 2020-08-12 MED ORDER — VITAMIN D (ERGOCALCIFEROL) 1.25 MG (50000 UNIT) PO CAPS: 50000.0000 [IU] | ORAL_CAPSULE | ORAL | 0 refills | Status: DC

## 2020-08-17 ENCOUNTER — Other Ambulatory Visit: Payer: Self-pay | Admitting: Obstetrics & Gynecology

## 2020-08-17 ENCOUNTER — Encounter (HOSPITAL_BASED_OUTPATIENT_CLINIC_OR_DEPARTMENT_OTHER): Payer: Self-pay

## 2020-08-17 DIAGNOSIS — K219 Gastro-esophageal reflux disease without esophagitis: Secondary | ICD-10-CM

## 2020-08-18 ENCOUNTER — Other Ambulatory Visit (INDEPENDENT_AMBULATORY_CARE_PROVIDER_SITE_OTHER): Payer: Self-pay | Admitting: Family Medicine

## 2020-08-18 DIAGNOSIS — E559 Vitamin D deficiency, unspecified: Secondary | ICD-10-CM

## 2020-08-18 NOTE — Progress Notes (Signed)
Chief Complaint:   OBESITY Alexis Jones is here to discuss her progress with her obesity treatment plan along with follow-up of her obesity related diagnoses. Alexis Jones is on keeping a food journal and adhering to recommended goals of 1200-1400 calories and 90 grams of protein daily and states she is following her eating plan approximately 80% of the time. Alexis Jones states she is biking and gardening for 30-60 minutes 2 times per week.  Today's visit was #: 68 Starting weight: 249 lbs Starting date: 05/16/2017 Today's weight: 232 lbs Today's date: 08/12/2020 Total lbs lost to date: 17 Total lbs lost since last in-office visit: 2  Interim History: Alexis Jones continues to do well with weight loss. She is working on [employer], [employer] and trying to meet her calories and protein goals. She is doing well with minimizing her emotional eating behaviors, and she feels she is doing well.  Subjective:   1. Vitamin D deficiency Alexis Jones is stable on Vit D, and she is requesting a refill today. She denies signs of over-replacement.  Assessment/Plan:   1. Vitamin D deficiency Low Vitamin D level contributes to fatigue and are associated with obesity, breast, and colon cancer. We will refill prescription Vitamin D for 1 month. Alexis Jones will follow-up for routine testing of Vitamin D, at least 2-3 times per year to avoid over-replacement.  - Vitamin D, Ergocalciferol, (DRISDOL) 1.25 MG (50000 UNIT) CAPS capsule; Take 1 capsule (50,000 Units total) by mouth every 14 (fourteen) days.  Dispense: 2 capsule; Refill: 0  2. Obesity with current BMI 37.6 Alexis Jones is currently in the action stage of change. As such, her goal is to continue with weight loss efforts. She has agreed to keeping a food journal and adhering to recommended goals of 1200-1400 calories and 90 grams of protein daily.   Exercise goals: As is.  Behavioral modification strategies: increasing lean protein intake and emotional eating  strategies.  Alexis Jones has agreed to follow-up with our clinic in 3 weeks. She was informed of the importance of frequent follow-up visits to maximize her success with intensive lifestyle modifications for her multiple health conditions.   Objective:   Blood pressure 113/72, pulse 74, temperature 97.9 F (36.6 C), height 5\' 6"  (1.676 m), weight 232 lb (105.2 kg), SpO2 98 %. Body mass index is 37.45 kg/m.  General: Cooperative, alert, well developed, in no acute distress. HEENT: Conjunctivae and lids unremarkable. Cardiovascular: Regular rhythm.  Lungs: Normal work of breathing. Neurologic: No focal deficits.   Lab Results  Component Value Date   CREATININE 0.75 06/23/2020   BUN 8 06/23/2020   NA 138 06/23/2020   K 3.8 06/23/2020   CL 103 06/23/2020   CO2 22 06/23/2020   Lab Results  Component Value Date   ALT 15 06/23/2020   AST 15 06/23/2020   ALKPHOS 66 06/23/2020   BILITOT 0.4 06/23/2020   Lab Results  Component Value Date   HGBA1C 5.3 06/23/2020   HGBA1C 5.4 01/21/2020   HGBA1C 5.4 09/19/2019   HGBA1C 5.5 05/09/2019   HGBA1C 5.3 12/28/2018   Lab Results  Component Value Date   INSULIN 9.1 06/23/2020   INSULIN 7.4 01/21/2020   INSULIN 7.3 09/19/2019   INSULIN 17.8 05/09/2019   INSULIN 104.0 (H) 12/28/2018   Lab Results  Component Value Date   TSH 2.010 12/28/2018   Lab Results  Component Value Date   CHOL 200 (H) 06/23/2020   HDL 37 (L) 06/23/2020   LDLCALC 137 (H) 06/23/2020   TRIG 144  06/23/2020   CHOLHDL 7.0 (H) 04/24/2018   Lab Results  Component Value Date   WBC 8.2 12/28/2018   HGB 12.7 12/28/2018   HCT 37.1 12/28/2018   MCV 84 12/28/2018   PLT 259 12/28/2018   Lab Results  Component Value Date   IRON 41 04/27/2018   TIBC 350 04/27/2018   FERRITIN 8 (L) 04/27/2018   Attestation Statements:   Reviewed by clinician on day of visit: allergies, medications, problem list, medical history, surgical history, family history, social  history, and previous encounter notes.  Time spent on visit including pre-visit chart review and post-visit care and charting was 30 minutes.    I, Trixie Dredge, am acting as transcriptionist for Dennard Nip, MD.  I have reviewed the above documentation for accuracy and completeness, and I agree with the above. -  Dennard Nip, MD

## 2020-08-22 ENCOUNTER — Ambulatory Visit: Payer: BC Managed Care – PPO

## 2020-08-22 ENCOUNTER — Encounter (INDEPENDENT_AMBULATORY_CARE_PROVIDER_SITE_OTHER): Payer: Self-pay | Admitting: Family Medicine

## 2020-08-27 ENCOUNTER — Ambulatory Visit (INDEPENDENT_AMBULATORY_CARE_PROVIDER_SITE_OTHER): Payer: BC Managed Care – PPO | Admitting: Family Medicine

## 2020-08-28 ENCOUNTER — Encounter (HOSPITAL_BASED_OUTPATIENT_CLINIC_OR_DEPARTMENT_OTHER): Payer: Self-pay | Admitting: *Deleted

## 2020-09-11 ENCOUNTER — Other Ambulatory Visit (INDEPENDENT_AMBULATORY_CARE_PROVIDER_SITE_OTHER): Payer: Self-pay | Admitting: Family Medicine

## 2020-09-11 DIAGNOSIS — E559 Vitamin D deficiency, unspecified: Secondary | ICD-10-CM

## 2020-09-11 NOTE — Telephone Encounter (Signed)
Last OV with Dr. Beasley 

## 2020-09-17 ENCOUNTER — Ambulatory Visit (INDEPENDENT_AMBULATORY_CARE_PROVIDER_SITE_OTHER): Payer: BC Managed Care – PPO | Admitting: Family Medicine

## 2020-09-17 ENCOUNTER — Encounter (INDEPENDENT_AMBULATORY_CARE_PROVIDER_SITE_OTHER): Payer: Self-pay | Admitting: Family Medicine

## 2020-09-17 ENCOUNTER — Other Ambulatory Visit: Payer: Self-pay

## 2020-09-17 VITALS — BP 108/73 | HR 77 | Temp 98.4°F | Ht 66.0 in | Wt 232.0 lb

## 2020-09-17 DIAGNOSIS — Z6841 Body Mass Index (BMI) 40.0 and over, adult: Secondary | ICD-10-CM

## 2020-09-17 DIAGNOSIS — E559 Vitamin D deficiency, unspecified: Secondary | ICD-10-CM | POA: Diagnosis not present

## 2020-09-17 DIAGNOSIS — E66813 Obesity, class 3: Secondary | ICD-10-CM

## 2020-09-17 DIAGNOSIS — F3289 Other specified depressive episodes: Secondary | ICD-10-CM | POA: Diagnosis not present

## 2020-09-17 DIAGNOSIS — Z9189 Other specified personal risk factors, not elsewhere classified: Secondary | ICD-10-CM | POA: Diagnosis not present

## 2020-09-17 MED ORDER — BUPROPION HCL ER (SR) 150 MG PO TB12: 150.0000 mg | ORAL_TABLET | Freq: Two times a day (BID) | ORAL | 0 refills | Status: DC

## 2020-09-17 MED ORDER — VITAMIN D (ERGOCALCIFEROL) 1.25 MG (50000 UNIT) PO CAPS: 50000.0000 [IU] | ORAL_CAPSULE | ORAL | 0 refills | Status: DC

## 2020-09-20 ENCOUNTER — Other Ambulatory Visit: Payer: Self-pay | Admitting: Obstetrics & Gynecology

## 2020-09-22 NOTE — Telephone Encounter (Signed)
Pt has annual exam with Dr Sabra Heck 09/24/20.  KW CMA

## 2020-09-24 ENCOUNTER — Ambulatory Visit (INDEPENDENT_AMBULATORY_CARE_PROVIDER_SITE_OTHER): Payer: BC Managed Care – PPO | Admitting: Obstetrics & Gynecology

## 2020-09-24 ENCOUNTER — Other Ambulatory Visit: Payer: Self-pay

## 2020-09-24 ENCOUNTER — Encounter (HOSPITAL_BASED_OUTPATIENT_CLINIC_OR_DEPARTMENT_OTHER): Payer: Self-pay | Admitting: Obstetrics & Gynecology

## 2020-09-24 VITALS — BP 119/74 | HR 77 | Ht 65.5 in | Wt 233.4 lb

## 2020-09-24 DIAGNOSIS — Z01419 Encounter for gynecological examination (general) (routine) without abnormal findings: Secondary | ICD-10-CM | POA: Diagnosis not present

## 2020-09-24 DIAGNOSIS — N8110 Cystocele, unspecified: Secondary | ICD-10-CM

## 2020-09-24 DIAGNOSIS — Z1231 Encounter for screening mammogram for malignant neoplasm of breast: Secondary | ICD-10-CM

## 2020-09-24 DIAGNOSIS — F3289 Other specified depressive episodes: Secondary | ICD-10-CM

## 2020-09-24 DIAGNOSIS — Z8041 Family history of malignant neoplasm of ovary: Secondary | ICD-10-CM

## 2020-09-24 MED ORDER — ESCITALOPRAM OXALATE 10 MG PO TABS: 10.0000 mg | ORAL_TABLET | Freq: Every day | ORAL | 4 refills | Status: DC

## 2020-09-24 NOTE — Progress Notes (Addendum)
40 y.o. [id number] Legally Separated White or Caucasian female here for annual exam.  She and husband separated last fall.  He was having an affair.  She has been seeing a therapist, Rodena Goldmann.  Does not need STD testing.  Has Mirena IUD 05/05/2015 placed.  7 year indication for contraception discussed.    No LMP recorded. (Menstrual status: IUD).          Sexually active: No.  The current method of family planning is IUD.    Exercising: Yes.    Home exercise routine includes walking 30 hrs per days. Smoker:  no  Health Maintenance: Pap:  05/21/19 neg, HR HPV neg History of abnormal Pap:  no MMG:  06/27/19.  Order placed.   Colonoscopy:  guidelines changed TDaP:  04/24/18 Hep C testing: not done Screening Labs: having done with [facility]   reports that she has never smoked. She has never used smokeless tobacco. She reports that she does not drink alcohol and does not use drugs.  Past Medical History:  Diagnosis Date   Anxiety    Asthma    Dyspnea    Female bladder prolapse    GERD (gastroesophageal reflux disease)    Hiatal hernia    High triglycerides    HLD (hyperlipidemia)    Left ovarian cyst    Low HDL (under 40)    Lower extremity edema    Microcytic anemia    PCOS (polycystic ovarian syndrome)    Prediabetes    Rectocele    Urinary incontinence    Vitamin D deficiency     Past Surgical History:  Procedure Laterality Date   BREAST REDUCTION SURGERY  2000   LIPOMA EXCISION  2015   left shoulder lipoma removed (2)   REDUCTION MAMMAPLASTY     TEAR DUCT PROBING  04/1979    Current Outpatient Medications  Medication Sig Dispense Refill   buPROPion (WELLBUTRIN SR) 150 MG 12 hr tablet Take 1 tablet (150 mg total) by mouth 2 (two) times daily. 60 tablet 0   escitalopram (LEXAPRO) 10 MG tablet TAKE 1 TABLET BY MOUTH EVERY DAY 90 tablet 0   ibuprofen (ADVIL,MOTRIN) 200 MG tablet Take 200 mg by mouth every 8 (eight) hours as needed.     levonorgestrel  (MIRENA) 20 MCG/24HR IUD 1 each by Intrauterine route once. Placed 05/05/15     Multiple Vitamins-Minerals (WOMENS MULTI VITAMIN & MINERAL PO) Take 1 tablet by mouth daily.     omeprazole (PRILOSEC) 40 MG capsule TAKE 1 CAPSULE BY MOUTH EVERY DAY 90 capsule 1   spironolactone (ALDACTONE) 100 MG tablet Take 1 tablet by mouth daily.     Vitamin D, Ergocalciferol, (DRISDOL) 1.25 MG (50000 UNIT) CAPS capsule Take 1 capsule (50,000 Units total) by mouth every 14 (fourteen) days. 2 capsule 0   No current facility-administered medications for this visit.    Family History  Problem Relation Age of Onset   Hypertension Mother    Hyperlipidemia Mother    Thyroid disease Mother    Anxiety disorder Mother    Obesity Mother    Cancer Father        prostate cancer   Mitral valve prolapse Father    Obesity Father    Breast cancer Maternal Aunt    Ovarian cancer Maternal Grandmother    Skin cancer Maternal Grandfather    Lung cancer Maternal Grandfather     Review of Systems  All other systems reviewed and are negative.  Exam:  BP 119/74   Pulse 77   Ht 5' 5.5" (1.664 m)   Wt 233 lb 6.4 oz (105.9 kg)   BMI 38.25 kg/m   Height: 5' 5.5" (166.4 cm)  General appearance: alert, cooperative and appears stated age Head: Normocephalic, without obvious abnormality, atraumatic Neck: no adenopathy, supple, symmetrical, trachea midline and thyroid normal to inspection and palpation Lungs: clear to auscultation bilaterally Breasts: normal appearance, no masses or tenderness Heart: regular rate and rhythm Abdomen: soft, non-tender; bowel sounds normal; no masses,  no organomegaly Extremities: extremities normal, atraumatic, no cyanosis or edema Skin: Skin color, texture, turgor normal. No rashes or lesions Lymph nodes: Cervical, supraclavicular, and axillary nodes normal. No abnormal inguinal nodes palpated Neurologic: Grossly normal   Pelvic: External genitalia:  no lesions               Urethra:  normal appearing urethra with no masses, tenderness or lesions              Bartholins and Skenes: normal                 Vagina: normal appearing vagina with normal color and no discharge, no lesions, stable second degree cystocele noted              Cervix: no lesions              Pap taken: No. Bimanual Exam:  Uterus:  normal size, contour, position, consistency, mobility, non-tender              Adnexa: normal adnexa and no mass, fullness, tenderness               Rectovaginal: Confirms               Anus:  normal sphincter tone, no lesions  Chaperone, Octaviano Batty, CMA, was present for exam.  Assessment/Plan: 1. Well woman exam with routine gynecological exam - pap obtained 05/21/2019.  Not indicated today. - MMG 06/27/2019.  Order placed for MMG today. - colonoscopy guidelines reviewed - vaccines reviewed and updated - lab work being done with [facility] at this time  2. Family history of ovarian cancer in MGM (mother with negative genetic testing)  3. Female bladder prolapse - stable on exam  4. Other depression - escitalopram (LEXAPRO) 10 MG tablet; Take 1 tablet (10 mg total) by mouth daily.  Dispense: 90 tablet; Refill: 4

## 2020-09-25 NOTE — Progress Notes (Signed)
Chief Complaint:   OBESITY Alexis Jones is here to discuss her progress with her obesity treatment plan along with follow-up of her obesity related diagnoses. Alexis Jones is on keeping a food journal and adhering to recommended goals of 1200-1400 calories and 90 grams of protein daily and states she is following her eating plan approximately 60% of the time. Alexis Jones states she is walking, biking, and gardening for 20-30 minutes 2 times per week.  Today's visit was #: 34 Starting weight: 249 lbs Starting date: 05/16/2017 Today's weight: 232 lbs Today's date: 09/17/2020 Total lbs lost to date: 17 Total lbs lost since last in-office visit: 0  Interim History: Kessie has done well with maintaining her weight loss. She is staying active but the heat has made this more difficult. She is trying to increase walking at work indoors with a co-worker.  Subjective:   1. Vitamin D deficiency Alexis Jones continues to do well on Vit D, and she denies signs of over-replacement.  2. Other depression with emotional eating Alexis Jones's mood is stable. She notes increased work stress and some extra temptations, but she is doing well overall.  3. At risk for impaired metabolic function Alexis Jones is at increased risk for impaired metabolic function due to current nutrition and muscle mass.  Assessment/Plan:   1. Vitamin D deficiency Low Vitamin D level contributes to fatigue and are associated with obesity, breast, and colon cancer. We will refill prescription Vitamin D 50,000 IU every 14 days for 1 month. Anabrenda will follow-up for routine testing of Vitamin D, at least 2-3 times per year to avoid over-replacement.  - Vitamin D, Ergocalciferol, (DRISDOL) 1.25 MG (50000 UNIT) CAPS capsule; Take 1 capsule (50,000 Units total) by mouth every 14 (fourteen) days.  Dispense: 2 capsule; Refill: 0  2. Other depression with emotional eating Behavior modification techniques were discussed today to help  Alexis Jones deal with her emotional/non-hunger eating behaviors. We will refill Wellbutrin SR for 1 month. Orders and follow up as documented in patient record.   - buPROPion (WELLBUTRIN SR) 150 MG 12 hr tablet; Take 1 tablet (150 mg total) by mouth 2 (two) times daily.  Dispense: 60 tablet; Refill: 0  3. At risk for impaired metabolic function Mose was given approximately 15 minutes of impaired  metabolic function prevention counseling today. We discussed intensive lifestyle modifications today with an emphasis on specific nutrition and exercise instructions and strategies.   Repetitive spaced learning was employed today to elicit superior memory formation and behavioral change.  4. Obesity with current BMI 37.5 Alexis Jones is currently in the action stage of change. As such, her goal is to continue with weight loss efforts. She has agreed to keeping a food journal and adhering to recommended goals of 1200-1400 calories and 90+ grams of protein daily.   Protein recipes were given.  Exercise goals: As is.  Behavioral modification strategies: increasing lean protein intake and keeping a strict food journal.  Alexis Jones has agreed to follow-up with our clinic in 2 to 3 weeks. She was informed of the importance of frequent follow-up visits to maximize her success with intensive lifestyle modifications for her multiple health conditions.   Objective:   Blood pressure 108/73, pulse 77, temperature 98.4 F (36.9 C), height '5\' 6"'$  (1.676 m), weight 232 lb (105.2 kg), SpO2 97 %. Body mass index is 37.45 kg/m.  General: Cooperative, alert, well developed, in no acute distress. HEENT: Conjunctivae and lids unremarkable. Cardiovascular: Regular rhythm.  Lungs: Normal work of breathing. Neurologic: No  focal deficits.   Lab Results  Component Value Date   CREATININE 0.75 06/23/2020   BUN 8 06/23/2020   NA 138 06/23/2020   K 3.8 06/23/2020   CL 103 06/23/2020   CO2 22 06/23/2020   Lab  Results  Component Value Date   ALT 15 06/23/2020   AST 15 06/23/2020   ALKPHOS 66 06/23/2020   BILITOT 0.4 06/23/2020   Lab Results  Component Value Date   HGBA1C 5.3 06/23/2020   HGBA1C 5.4 01/21/2020   HGBA1C 5.4 09/19/2019   HGBA1C 5.5 05/09/2019   HGBA1C 5.3 12/28/2018   Lab Results  Component Value Date   INSULIN 9.1 06/23/2020   INSULIN 7.4 01/21/2020   INSULIN 7.3 09/19/2019   INSULIN 17.8 05/09/2019   INSULIN 104.0 (H) 12/28/2018   Lab Results  Component Value Date   TSH 2.010 12/28/2018   Lab Results  Component Value Date   CHOL 200 (H) 06/23/2020   HDL 37 (L) 06/23/2020   LDLCALC 137 (H) 06/23/2020   TRIG 144 06/23/2020   CHOLHDL 7.0 (H) 04/24/2018   Lab Results  Component Value Date   VD25OH 46.3 06/23/2020   VD25OH 80.0 01/21/2020   VD25OH 66.2 09/19/2019   Lab Results  Component Value Date   WBC 8.2 12/28/2018   HGB 12.7 12/28/2018   HCT 37.1 12/28/2018   MCV 84 12/28/2018   PLT 259 12/28/2018   Lab Results  Component Value Date   IRON 41 04/27/2018   TIBC 350 04/27/2018   FERRITIN 8 (L) 04/27/2018   Attestation Statements:   Reviewed by clinician on day of visit: allergies, medications, problem list, medical history, surgical history, family history, social history, and previous encounter notes.   I, Trixie Dredge, am acting as transcriptionist for Dennard Nip, MD.  I have reviewed the above documentation for accuracy and completeness, and I agree with the above. -  Dennard Nip, MD

## 2020-09-30 ENCOUNTER — Ambulatory Visit (HOSPITAL_BASED_OUTPATIENT_CLINIC_OR_DEPARTMENT_OTHER)
Admission: RE | Admit: 2020-09-30 | Discharge: 2020-09-30 | Disposition: A | Discharge status: Home / Self Care | Payer: BC Managed Care – PPO | Source: Ambulatory Visit | Attending: Obstetrics & Gynecology | Admitting: Obstetrics & Gynecology

## 2020-09-30 ENCOUNTER — Other Ambulatory Visit: Payer: Self-pay

## 2020-09-30 DIAGNOSIS — Z1231 Encounter for screening mammogram for malignant neoplasm of breast: Secondary | ICD-10-CM | POA: Insufficient documentation

## 2020-10-01 ENCOUNTER — Ambulatory Visit (INDEPENDENT_AMBULATORY_CARE_PROVIDER_SITE_OTHER): Payer: BC Managed Care – PPO | Admitting: Family Medicine

## 2020-10-01 ENCOUNTER — Encounter (INDEPENDENT_AMBULATORY_CARE_PROVIDER_SITE_OTHER): Payer: Self-pay | Admitting: Family Medicine

## 2020-10-01 VITALS — BP 111/74 | HR 73 | Temp 98.1°F | Ht 66.0 in | Wt 232.0 lb

## 2020-10-01 DIAGNOSIS — E559 Vitamin D deficiency, unspecified: Secondary | ICD-10-CM | POA: Diagnosis not present

## 2020-10-01 DIAGNOSIS — Z6841 Body Mass Index (BMI) 40.0 and over, adult: Secondary | ICD-10-CM | POA: Diagnosis not present

## 2020-10-01 DIAGNOSIS — Z9189 Other specified personal risk factors, not elsewhere classified: Secondary | ICD-10-CM

## 2020-10-01 MED ORDER — VITAMIN D (ERGOCALCIFEROL) 1.25 MG (50000 UNIT) PO CAPS: 50000.0000 [IU] | ORAL_CAPSULE | ORAL | 0 refills | Status: DC

## 2020-10-02 NOTE — Progress Notes (Signed)
Chief Complaint:   OBESITY Alexis Jones is here to discuss her progress with her obesity treatment plan along with follow-up of her obesity related diagnoses. Alexis Jones is on keeping a food journal and adhering to recommended goals of 1200-1400 calories and 90+ grams of protein daily and states she is following her eating plan approximately 70% of the time. Alexis Jones states she is doing gardening, walking, and row machine for 30 minutes 2 times per week.  Today's visit was #: 36 Starting weight: 249 lbs Starting date: 05/16/2017 Today's weight: 232 lbs Today's date: 09/30/2020 Total lbs lost to date: 17 Total lbs lost since last in-office visit: 0  Interim History: Alexis Jones continue to do well maintaining her weight loss. She has a lot of stress at work but she has worked to [employer] She feels this will be improving soon.  Subjective:   1. Vitamin D deficiency Alexis Jones will continue to do well with remembering to take her weekly Vit D prescription. She denies signs of over-replacement.  2. At risk for dehydration Alexis Jones is at risk for dehydration due to inadequate water intake.  Assessment/Plan:   1. Vitamin D deficiency Low Vitamin D level contributes to fatigue and are associated with obesity, breast, and colon cancer. We will refill prescription Vitamin D for 1 month. Alexis Jones will follow-up for routine testing of Vitamin D, at least 2-3 times per year to avoid over-replacement.  - Vitamin D, Ergocalciferol, (DRISDOL) 1.25 MG (50000 UNIT) CAPS capsule; Take 1 capsule (50,000 Units total) by mouth every 14 (fourteen) days.  Dispense: 2 capsule; Refill: 0  2. At risk for dehydration Alexis Jones was given approximately 15 minutes dehydration prevention counseling today. Alexis Jones is at risk for dehydration due to weight loss and current medication(s). She was encouraged to hydrate and monitor fluid status to avoid dehydration as well as weight loss plateaus.     3. Obesity with current BMI 37.6 Alexis Jones is currently in the action stage of change. As such, her goal is to continue with weight loss efforts. She has agreed to keeping a food journal and adhering to recommended goals of 1200-1400 calories and 90+ grams of protein daily.   Exercise goals: As is.  Behavioral modification strategies: increasing lean protein intake.  Alexis Jones has agreed to follow-up with our clinic in 3 weeks. She was informed of the importance of frequent follow-up visits to maximize her success with intensive lifestyle modifications for her multiple health conditions.   Objective:   Blood pressure 111/74, pulse 73, temperature 98.1 F (36.7 C), height '5\' 6"'$  (1.676 m), weight 232 lb (105.2 kg), SpO2 97 %. Body mass index is 37.45 kg/m.  General: Cooperative, alert, well developed, in no acute distress. HEENT: Conjunctivae and lids unremarkable. Cardiovascular: Regular rhythm.  Lungs: Normal work of breathing. Neurologic: No focal deficits.   Lab Results  Component Value Date   CREATININE 0.75 06/23/2020   BUN 8 06/23/2020   NA 138 06/23/2020   K 3.8 06/23/2020   CL 103 06/23/2020   CO2 22 06/23/2020   Lab Results  Component Value Date   ALT 15 06/23/2020   AST 15 06/23/2020   ALKPHOS 66 06/23/2020   BILITOT 0.4 06/23/2020   Lab Results  Component Value Date   HGBA1C 5.3 06/23/2020   HGBA1C 5.4 01/21/2020   HGBA1C 5.4 09/19/2019   HGBA1C 5.5 05/09/2019   HGBA1C 5.3 12/28/2018   Lab Results  Component Value Date   INSULIN 9.1 06/23/2020   INSULIN 7.4  01/21/2020   INSULIN 7.3 09/19/2019   INSULIN 17.8 05/09/2019   INSULIN 104.0 (H) 12/28/2018   Lab Results  Component Value Date   TSH 2.010 12/28/2018   Lab Results  Component Value Date   CHOL 200 (H) 06/23/2020   HDL 37 (L) 06/23/2020   LDLCALC 137 (H) 06/23/2020   TRIG 144 06/23/2020   CHOLHDL 7.0 (H) 04/24/2018   Lab Results  Component Value Date   VD25OH 46.3 06/23/2020    VD25OH 80.0 01/21/2020   VD25OH 66.2 09/19/2019   Lab Results  Component Value Date   WBC 8.2 12/28/2018   HGB 12.7 12/28/2018   HCT 37.1 12/28/2018   MCV 84 12/28/2018   PLT 259 12/28/2018   Lab Results  Component Value Date   IRON 41 04/27/2018   TIBC 350 04/27/2018   FERRITIN 8 (L) 04/27/2018   Attestation Statements:   Reviewed by clinician on day of visit: allergies, medications, problem list, medical history, surgical history, family history, social history, and previous encounter notes.  I, Trixie Dredge, am acting as transcriptionist for Dennard Nip, MD.  I have reviewed the above documentation for accuracy and completeness, and I agree with the above. -  Dennard Nip, MD

## 2020-10-06 ENCOUNTER — Encounter (HOSPITAL_BASED_OUTPATIENT_CLINIC_OR_DEPARTMENT_OTHER): Payer: Self-pay

## 2020-10-07 ENCOUNTER — Other Ambulatory Visit: Payer: Self-pay | Admitting: Obstetrics & Gynecology

## 2020-10-07 DIAGNOSIS — R928 Other abnormal and inconclusive findings on diagnostic imaging of breast: Secondary | ICD-10-CM

## 2020-10-09 ENCOUNTER — Other Ambulatory Visit (INDEPENDENT_AMBULATORY_CARE_PROVIDER_SITE_OTHER): Payer: Self-pay | Admitting: Family Medicine

## 2020-10-09 DIAGNOSIS — E559 Vitamin D deficiency, unspecified: Secondary | ICD-10-CM

## 2020-10-17 ENCOUNTER — Ambulatory Visit (HOSPITAL_BASED_OUTPATIENT_CLINIC_OR_DEPARTMENT_OTHER): Payer: Self-pay | Admitting: Obstetrics & Gynecology

## 2020-10-20 ENCOUNTER — Encounter (INDEPENDENT_AMBULATORY_CARE_PROVIDER_SITE_OTHER): Payer: Self-pay | Admitting: Family Medicine

## 2020-10-20 ENCOUNTER — Ambulatory Visit (INDEPENDENT_AMBULATORY_CARE_PROVIDER_SITE_OTHER): Payer: BC Managed Care – PPO | Admitting: Family Medicine

## 2020-10-20 ENCOUNTER — Other Ambulatory Visit: Payer: Self-pay

## 2020-10-20 VITALS — BP 116/73 | HR 70 | Temp 98.0°F | Ht 66.0 in | Wt 233.0 lb

## 2020-10-20 DIAGNOSIS — K2100 Gastro-esophageal reflux disease with esophagitis, without bleeding: Secondary | ICD-10-CM

## 2020-10-20 DIAGNOSIS — Z6841 Body Mass Index (BMI) 40.0 and over, adult: Secondary | ICD-10-CM

## 2020-10-20 DIAGNOSIS — Z9189 Other specified personal risk factors, not elsewhere classified: Secondary | ICD-10-CM

## 2020-10-20 DIAGNOSIS — F3289 Other specified depressive episodes: Secondary | ICD-10-CM | POA: Diagnosis not present

## 2020-10-20 MED ORDER — BUPROPION HCL ER (SR) 150 MG PO TB12: 150.0000 mg | ORAL_TABLET | Freq: Two times a day (BID) | ORAL | 0 refills | Status: DC

## 2020-10-21 ENCOUNTER — Encounter (HOSPITAL_BASED_OUTPATIENT_CLINIC_OR_DEPARTMENT_OTHER): Payer: Self-pay

## 2020-10-21 NOTE — Progress Notes (Signed)
Chief Complaint:   OBESITY Alexis Jones is here to discuss her progress with her obesity treatment plan along with follow-up of her obesity related diagnoses. Alexis Jones is on keeping a food journal and adhering to recommended goals of 1200-1400 calories and 90+ grams of protein daily and states she is following her eating plan approximately 20% of the time. Alexis Jones states she is doing 0 minutes 0 times per week.  Today's visit was #: 68 Starting weight: 249 lbs Starting date: 05/16/2017 Today's weight: 233 lbs Today's date: 10/20/2020 Total lbs lost to date: 16 Total lbs lost since last in-office visit: 0  Interim History: Miana was on vacation and she did a bit of celebration eating. She is ready to get back on track and she is open to finding easy protein ideas.  Subjective:   1. Gastroesophageal reflux disease with esophagitis, unspecified whether hemorrhage Alexis Jones is on Omeprazole, and she is doing well with diet and weight loss. She has bee on a PPI for years.  2. Other depression with emotional eating Alexis Jones is stable on her medications, and she is doing well with decreasing emotional eating behaviors. No side effects were noted.  3. At risk for osteoporosis Alexis Jones is at higher risk of osteopenia and osteoporosis due to Vitamin D deficiency and chronic PPI medications.   Assessment/Plan:   1. Gastroesophageal reflux disease with esophagitis, unspecified whether hemorrhage Intensive lifestyle modifications are the first line treatment for this issue. We discussed several lifestyle modifications today. Alexis Jones is ok to continue as is, and continue to work on diet, exercise and weight loss efforts. Orders and follow up as documented in patient record.   2. Other depression with emotional eating Behavior modification techniques were discussed today to help Alexis Jones deal with her emotional/non-hunger eating behaviors. We will refill Wellbutrin SR for 1 month, and  Chantee will continue Lexapro. Orders and follow up as documented in patient record.   - buPROPion (WELLBUTRIN SR) 150 MG 12 hr tablet; Take 1 tablet (150 mg total) by mouth 2 (two) times daily.  Dispense: 60 tablet; Refill: 0  3. At risk for osteoporosis Alexis Jones was given approximately 15 minutes of osteoporosis prevention counseling today. Alexis Jones is at risk for osteopenia and osteoporosis due to her Vitamin D deficiency. She was encouraged to take her Vitamin D and follow her higher calcium diet and increase strengthening exercise to help strengthen her bones and decrease her risk of osteopenia and osteoporosis.  Repetitive spaced learning was employed today to elicit superior memory formation and behavioral change.  4. Obesity with current BMI 37.6 Alexis Jones is currently in the action stage of change. As such, her goal is to continue with weight loss efforts. She has agreed to keeping a food journal and adhering to recommended goals of 1200-1400 calories and 90+ grams of protein daily.   Protein recipes were discussed today.  Exercise goals: All adults should avoid inactivity. Some physical activity is better than none, and adults who participate in any amount of physical activity gain some health benefits.  Behavioral modification strategies: increasing lean protein intake and meal planning and cooking strategies.  Alexis Jones has agreed to follow-up with our clinic in 3 weeks. She was informed of the importance of frequent follow-up visits to maximize her success with intensive lifestyle modifications for her multiple health conditions.   Objective:   Blood pressure 116/73, pulse 70, temperature 98 F (36.7 C), height '5\' 6"'$  (1.676 m), weight 233 lb (105.7 kg), SpO2 97 %. Body mass  index is 37.61 kg/m.  General: Cooperative, alert, well developed, in no acute distress. HEENT: Conjunctivae and lids unremarkable. Cardiovascular: Regular rhythm.  Lungs: Normal work of  breathing. Neurologic: No focal deficits.   Lab Results  Component Value Date   CREATININE 0.75 06/23/2020   BUN 8 06/23/2020   NA 138 06/23/2020   K 3.8 06/23/2020   CL 103 06/23/2020   CO2 22 06/23/2020   Lab Results  Component Value Date   ALT 15 06/23/2020   AST 15 06/23/2020   ALKPHOS 66 06/23/2020   BILITOT 0.4 06/23/2020   Lab Results  Component Value Date   HGBA1C 5.3 06/23/2020   HGBA1C 5.4 01/21/2020   HGBA1C 5.4 09/19/2019   HGBA1C 5.5 05/09/2019   HGBA1C 5.3 12/28/2018   Lab Results  Component Value Date   INSULIN 9.1 06/23/2020   INSULIN 7.4 01/21/2020   INSULIN 7.3 09/19/2019   INSULIN 17.8 05/09/2019   INSULIN 104.0 (H) 12/28/2018   Lab Results  Component Value Date   TSH 2.010 12/28/2018   Lab Results  Component Value Date   CHOL 200 (H) 06/23/2020   HDL 37 (L) 06/23/2020   LDLCALC 137 (H) 06/23/2020   TRIG 144 06/23/2020   CHOLHDL 7.0 (H) 04/24/2018   Lab Results  Component Value Date   VD25OH 46.3 06/23/2020   VD25OH 80.0 01/21/2020   VD25OH 66.2 09/19/2019   Lab Results  Component Value Date   WBC 8.2 12/28/2018   HGB 12.7 12/28/2018   HCT 37.1 12/28/2018   MCV 84 12/28/2018   PLT 259 12/28/2018   Lab Results  Component Value Date   IRON 41 04/27/2018   TIBC 350 04/27/2018   FERRITIN 8 (L) 04/27/2018   Attestation Statements:   Reviewed by clinician on day of visit: allergies, medications, problem list, medical history, surgical history, family history, social history, and previous encounter notes.   I, Trixie Dredge, am acting as transcriptionist for Dennard Nip, MD.  I have reviewed the above documentation for accuracy and completeness, and I agree with the above. -  Dennard Nip, MD

## 2020-11-05 ENCOUNTER — Ambulatory Visit (INDEPENDENT_AMBULATORY_CARE_PROVIDER_SITE_OTHER): Payer: BC Managed Care – PPO | Admitting: Family Medicine

## 2020-11-05 ENCOUNTER — Other Ambulatory Visit: Payer: Self-pay

## 2020-11-05 ENCOUNTER — Encounter (INDEPENDENT_AMBULATORY_CARE_PROVIDER_SITE_OTHER): Payer: Self-pay | Admitting: Family Medicine

## 2020-11-05 VITALS — BP 109/68 | HR 77 | Temp 98.1°F | Ht 66.0 in | Wt 235.0 lb

## 2020-11-05 DIAGNOSIS — F439 Reaction to severe stress, unspecified: Secondary | ICD-10-CM

## 2020-11-05 DIAGNOSIS — Z6841 Body Mass Index (BMI) 40.0 and over, adult: Secondary | ICD-10-CM | POA: Diagnosis not present

## 2020-11-06 NOTE — Progress Notes (Signed)
Chief Complaint:   OBESITY Alexis Jones is here to discuss her progress with her obesity treatment plan along with follow-up of her obesity related diagnoses. Alexis Jones is on keeping a food journal and adhering to recommended goals of 1200-1400 calories and 90+ grams of protein daily and states she is following her eating plan approximately 30% of the time. Alexis Jones states she is doing 0 minutes 0 times per week.  Today's visit was #: 71 Starting weight: 249 lbs Starting date: 05/16/2017 Today's weight: 235 lbs Today's date: 11/05/2020 Total lbs lost to date: 14 Total lbs lost since last in-office visit: 0  Interim History: Alexis Jones is retaining some water weight today. 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  Subjective:   1. 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 Her mood is appropriate, but this has hindered her weight loss efforts.  Assessment/Plan:   1. Stress Alexis Jones was given easy meal planning ideas and was offered support and encouragement. We will follow up in 3 weeks to see how she is doing.  2. Obesity with current BMI 37.9 Alexis Jones is currently in the action stage of change. As such, her goal is to continue with weight loss efforts. She has agreed to keeping a food journal and adhering to recommended goals of 1200-1400 calories and 90+ grams of protein daily.   Behavioral modification strategies: meal planning and cooking strategies.  Alexis Jones has agreed to follow-up with our clinic in 3 weeks. She was informed of the importance of frequent follow-up visits to maximize her success with intensive lifestyle modifications for her multiple health conditions.   Objective:   Blood pressure 109/68, pulse 77, temperature 98.1 F (36.7 C), height '5\' 6"'$  (1.676 m), weight 235 lb (106.6 kg), SpO2 97 %. Body mass index is 37.93 kg/m.  General: Cooperative, alert,  well developed, in no acute distress. HEENT: Conjunctivae and lids unremarkable. Cardiovascular: Regular rhythm.  Lungs: Normal work of breathing. Neurologic: No focal deficits.   Lab Results  Component Value Date   CREATININE 0.75 06/23/2020   BUN 8 06/23/2020   NA 138 06/23/2020   K 3.8 06/23/2020   CL 103 06/23/2020   CO2 22 06/23/2020   Lab Results  Component Value Date   ALT 15 06/23/2020   AST 15 06/23/2020   ALKPHOS 66 06/23/2020   BILITOT 0.4 06/23/2020   Lab Results  Component Value Date   HGBA1C 5.3 06/23/2020   HGBA1C 5.4 01/21/2020   HGBA1C 5.4 09/19/2019   HGBA1C 5.5 05/09/2019   HGBA1C 5.3 12/28/2018   Lab Results  Component Value Date   INSULIN 9.1 06/23/2020   INSULIN 7.4 01/21/2020   INSULIN 7.3 09/19/2019   INSULIN 17.8 05/09/2019   INSULIN 104.0 (H) 12/28/2018   Lab Results  Component Value Date   TSH 2.010 12/28/2018   Lab Results  Component Value Date   CHOL 200 (H) 06/23/2020   HDL 37 (L) 06/23/2020   LDLCALC 137 (H) 06/23/2020   TRIG 144 06/23/2020   CHOLHDL 7.0 (H) 04/24/2018   Lab Results  Component Value Date   VD25OH 46.3 06/23/2020   VD25OH 80.0 01/21/2020   VD25OH 66.2 09/19/2019   Lab Results  Component Value Date   WBC 8.2 12/28/2018   HGB 12.7 12/28/2018   HCT 37.1 12/28/2018   MCV 84 12/28/2018   PLT 259 12/28/2018   Lab Results  Component Value Date  IRON 41 04/27/2018   TIBC 350 04/27/2018   FERRITIN 8 (L) 04/27/2018   Attestation Statements:   Reviewed by clinician on day of visit: allergies, medications, problem list, medical history, surgical history, family history, social history, and previous encounter notes.  Time spent on visit including pre-visit chart review and post-visit care and charting was 32 minutes.    I, Trixie Dredge, am acting as transcriptionist for Dennard Nip, MD.  I have reviewed the above documentation for accuracy and completeness, and I agree with the above. -  Dennard Nip, MD

## 2020-11-11 ENCOUNTER — Other Ambulatory Visit: Payer: Self-pay

## 2020-11-11 ENCOUNTER — Other Ambulatory Visit: Payer: Self-pay | Admitting: Obstetrics & Gynecology

## 2020-11-11 ENCOUNTER — Ambulatory Visit
Admission: RE | Admit: 2020-11-11 | Discharge: 2020-11-11 | Disposition: A | Discharge status: Home / Self Care | Payer: BC Managed Care – PPO | Source: Ambulatory Visit | Attending: Obstetrics & Gynecology | Admitting: Obstetrics & Gynecology

## 2020-11-11 DIAGNOSIS — R928 Other abnormal and inconclusive findings on diagnostic imaging of breast: Secondary | ICD-10-CM

## 2020-11-11 DIAGNOSIS — N6489 Other specified disorders of breast: Secondary | ICD-10-CM

## 2020-11-14 ENCOUNTER — Ambulatory Visit
Admission: RE | Admit: 2020-11-14 | Discharge: 2020-11-14 | Disposition: A | Discharge status: Home / Self Care | Payer: BC Managed Care – PPO | Source: Ambulatory Visit | Attending: Obstetrics & Gynecology | Admitting: Obstetrics & Gynecology

## 2020-11-14 ENCOUNTER — Other Ambulatory Visit (INDEPENDENT_AMBULATORY_CARE_PROVIDER_SITE_OTHER): Payer: Self-pay | Admitting: Family Medicine

## 2020-11-14 ENCOUNTER — Other Ambulatory Visit: Payer: Self-pay | Admitting: Obstetrics & Gynecology

## 2020-11-14 ENCOUNTER — Other Ambulatory Visit: Payer: Self-pay

## 2020-11-14 DIAGNOSIS — N6489 Other specified disorders of breast: Secondary | ICD-10-CM

## 2020-11-14 DIAGNOSIS — E559 Vitamin D deficiency, unspecified: Secondary | ICD-10-CM

## 2020-11-17 NOTE — Telephone Encounter (Signed)
Last OV with Dr. Beasley 

## 2020-11-17 NOTE — Telephone Encounter (Signed)
LAST APPOINTMENT DATE: 11/05/20 NEXT APPOINTMENT DATE: 11/26/20   [id number] [place], [place] [address] [address]Phone: [phone]: [phone] Patient is requesting a refill of the following medications: Requested Prescriptions   Pending Prescriptions Disp Refills   Vitamin D, Ergocalciferol, (DRISDOL) 1.25 MG (50000 UNIT) CAPS capsule [Pharmacy Med Name: VITAMIN D2 1.'25MG'$ (50,000 UNIT)] 2 capsule 0    Sig: TAKE 1 CAPSULE (50,000 UNITS TOTAL) BY MOUTH EVERY 14 (FOURTEEN) DAYS.    Date last filled: 10/01/20 [facility] Lab Results  Component Value Date   HGBA1C 5.3 06/23/2020   HGBA1C 5.4 01/21/2020   HGBA1C 5.4 09/19/2019   Lab Results  Component Value Date   LDLCALC 137 (H) 06/23/2020   CREATININE 0.75 06/23/2020   Lab Results  Component Value Date   VD25OH 46.3 06/23/2020   VD25OH 80.0 01/21/2020   VD25OH 66.2 09/19/2019    BP Readings from Last 3 Encounters:  11/05/20 109/68  10/20/20 116/73  10/01/20 111/74

## 2020-11-21 ENCOUNTER — Other Ambulatory Visit (INDEPENDENT_AMBULATORY_CARE_PROVIDER_SITE_OTHER): Payer: Self-pay | Admitting: Family Medicine

## 2020-11-21 DIAGNOSIS — F3289 Other specified depressive episodes: Secondary | ICD-10-CM

## 2020-11-21 NOTE — Telephone Encounter (Signed)
LAST APPOINTMENT DATE: 11/05/20 NEXT APPOINTMENT DATE: 11/26/20   [id number] [place], [place] [address] [address] Phone: [phone] Fax: [phone]  Patient is requesting a refill of the following medications: Requested Prescriptions   Pending Prescriptions Disp Refills   buPROPion (WELLBUTRIN SR) 150 MG 12 hr tablet [Pharmacy Med Name: BUPROPION HCL SR 150 MG TABLET] 60 tablet 0    Sig: TAKE 1 TABLET BY MOUTH TWICE A DAY    Date last filled: 10/20/20 Previously prescribed by [facility]  Lab Results  Component Value Date   HGBA1C 5.3 06/23/2020   HGBA1C 5.4 01/21/2020   HGBA1C 5.4 09/19/2019   Lab Results  Component Value Date   LDLCALC 137 (H) 06/23/2020   CREATININE 0.75 06/23/2020   Lab Results  Component Value Date   VD25OH 46.3 06/23/2020   VD25OH 80.0 01/21/2020   VD25OH 66.2 09/19/2019    BP Readings from Last 3 Encounters:  11/05/20 109/68  10/20/20 116/73  10/01/20 111/74

## 2020-11-26 ENCOUNTER — Ambulatory Visit (INDEPENDENT_AMBULATORY_CARE_PROVIDER_SITE_OTHER): Payer: BC Managed Care – PPO | Admitting: Family Medicine

## 2020-11-26 ENCOUNTER — Encounter (INDEPENDENT_AMBULATORY_CARE_PROVIDER_SITE_OTHER): Payer: Self-pay

## 2020-12-10 ENCOUNTER — Other Ambulatory Visit (INDEPENDENT_AMBULATORY_CARE_PROVIDER_SITE_OTHER): Payer: Self-pay | Admitting: Family Medicine

## 2020-12-10 DIAGNOSIS — E559 Vitamin D deficiency, unspecified: Secondary | ICD-10-CM

## 2020-12-10 NOTE — Telephone Encounter (Signed)
Dr.Beasley 

## 2020-12-10 NOTE — Telephone Encounter (Signed)
LAST APPOINTMENT DATE: 11/05/20 NEXT APPOINTMENT DATE: 12/17/20   [id number] [place] [place] [address] [place] [address] Phone: [phone] Fax: [phone]  Patient is requesting a refill of the following medications: Requested Prescriptions   Pending Prescriptions Disp Refills   Vitamin D, Ergocalciferol, (DRISDOL) 1.25 MG (50000 UNIT) CAPS capsule [Pharmacy Med Name: VITAMIN D2 1.25MG (50,000 UNIT)] 2 capsule 0    Sig: TAKE 1 CAPSULE (50,000 UNITS TOTAL) BY MOUTH EVERY 14 DAYS    Date last filled: 11/17/20 Previously prescribed by Dr. Leafy Ro  Lab Results  Component Value Date   HGBA1C 5.3 06/23/2020   HGBA1C 5.4 01/21/2020   HGBA1C 5.4 09/19/2019   Lab Results  Component Value Date   LDLCALC 137 (H) 06/23/2020   CREATININE 0.75 06/23/2020   Lab Results  Component Value Date   VD25OH 46.3 06/23/2020   VD25OH 80.0 01/21/2020   VD25OH 66.2 09/19/2019    BP Readings from Last 3 Encounters:  11/05/20 109/68  10/20/20 116/73  10/01/20 111/74

## 2020-12-16 ENCOUNTER — Encounter (HOSPITAL_BASED_OUTPATIENT_CLINIC_OR_DEPARTMENT_OTHER): Payer: Self-pay

## 2020-12-17 ENCOUNTER — Ambulatory Visit (INDEPENDENT_AMBULATORY_CARE_PROVIDER_SITE_OTHER): Payer: BC Managed Care – PPO | Admitting: Family Medicine

## 2021-01-04 ENCOUNTER — Other Ambulatory Visit (INDEPENDENT_AMBULATORY_CARE_PROVIDER_SITE_OTHER): Payer: Self-pay | Admitting: Family Medicine

## 2021-01-04 DIAGNOSIS — F3289 Other specified depressive episodes: Secondary | ICD-10-CM

## 2021-01-05 NOTE — Telephone Encounter (Signed)
Pt needs a fu appointment

## 2021-01-05 NOTE — Telephone Encounter (Signed)
Pt last seen by Dr. Beasley.  

## 2021-01-05 NOTE — Telephone Encounter (Signed)
LAST APPOINTMENT DATE: 11/05/20 NEXT APPOINTMENT DATE: None   [id number] [place] [place] [address] [address] Phone: [phone] Fax: [phone]  Patient is requesting a refill of the following medications: Requested Prescriptions   Pending Prescriptions Disp Refills   buPROPion (WELLBUTRIN SR) 150 MG 12 hr tablet [Pharmacy Med Name: BUPROPION HCL SR 150 MG TABLET] 60 tablet 0    Sig: TAKE 1 TABLET BY MOUTH TWICE A DAY    Date last filled: 11/24/20 Previously prescribed by Dr. Leafy Ro  Lab Results  Component Value Date   HGBA1C 5.3 06/23/2020   HGBA1C 5.4 01/21/2020   HGBA1C 5.4 09/19/2019   Lab Results  Component Value Date   LDLCALC 137 (H) 06/23/2020   CREATININE 0.75 06/23/2020   Lab Results  Component Value Date   VD25OH 46.3 06/23/2020   VD25OH 80.0 01/21/2020   VD25OH 66.2 09/19/2019    BP Readings from Last 3 Encounters:  11/05/20 109/68  10/20/20 116/73  10/01/20 111/74

## 2021-01-12 ENCOUNTER — Other Ambulatory Visit (INDEPENDENT_AMBULATORY_CARE_PROVIDER_SITE_OTHER): Payer: Self-pay | Admitting: Family Medicine

## 2021-01-12 DIAGNOSIS — E559 Vitamin D deficiency, unspecified: Secondary | ICD-10-CM

## 2021-01-12 NOTE — Telephone Encounter (Signed)
Pt last seen by Dr. Beasley.  

## 2021-01-12 NOTE — Telephone Encounter (Signed)
LAST APPOINTMENT DATE: 11/05/20 NEXT APPOINTMENT DATE: None scheduled   [id number] [place] [place] [address] [place] [address] Phone: [phone] Fax: [phone]  Patient is requesting a refill of the following medications: Requested Prescriptions   Pending Prescriptions Disp Refills   Vitamin D, Ergocalciferol, (DRISDOL) 1.25 MG (50000 UNIT) CAPS capsule [Pharmacy Med Name: VITAMIN D2 1.25MG (50,000 UNIT)] 2 capsule 0    Sig: TAKE 1 CAPSULE (50,000 UNITS TOTAL) BY MOUTH EVERY 14 DAYS    Date last filled: 12/10/20 Previously prescribed by Dr. Leafy Ro  Lab Results  Component Value Date   HGBA1C 5.3 06/23/2020   HGBA1C 5.4 01/21/2020   HGBA1C 5.4 09/19/2019   Lab Results  Component Value Date   LDLCALC 137 (H) 06/23/2020   CREATININE 0.75 06/23/2020   Lab Results  Component Value Date   VD25OH 46.3 06/23/2020   VD25OH 80.0 01/21/2020   VD25OH 66.2 09/19/2019    BP Readings from Last 3 Encounters:  11/05/20 109/68  10/20/20 116/73  10/01/20 111/74

## 2021-01-13 NOTE — Telephone Encounter (Signed)
Please call pt and get her scheduled, then will refill

## 2021-01-18 ENCOUNTER — Encounter (HOSPITAL_BASED_OUTPATIENT_CLINIC_OR_DEPARTMENT_OTHER): Payer: Self-pay | Admitting: Obstetrics & Gynecology

## 2021-02-14 ENCOUNTER — Other Ambulatory Visit: Payer: Self-pay | Admitting: Obstetrics & Gynecology

## 2021-02-14 DIAGNOSIS — K219 Gastro-esophageal reflux disease without esophagitis: Secondary | ICD-10-CM

## 2021-05-15 ENCOUNTER — Other Ambulatory Visit: Payer: Self-pay | Admitting: Obstetrics & Gynecology

## 2021-05-15 ENCOUNTER — Ambulatory Visit
Admission: RE | Admit: 2021-05-15 | Discharge: 2021-05-15 | Disposition: A | Discharge status: Home / Self Care | Payer: BC Managed Care – PPO | Source: Ambulatory Visit | Attending: Obstetrics & Gynecology | Admitting: Obstetrics & Gynecology

## 2021-05-15 DIAGNOSIS — N6489 Other specified disorders of breast: Secondary | ICD-10-CM

## 2021-08-27 ENCOUNTER — Other Ambulatory Visit: Payer: Self-pay | Admitting: Obstetrics & Gynecology

## 2021-08-27 DIAGNOSIS — K219 Gastro-esophageal reflux disease without esophagitis: Secondary | ICD-10-CM

## 2021-10-07 ENCOUNTER — Encounter (INDEPENDENT_AMBULATORY_CARE_PROVIDER_SITE_OTHER): Payer: Self-pay

## 2021-10-09 ENCOUNTER — Ambulatory Visit (HOSPITAL_BASED_OUTPATIENT_CLINIC_OR_DEPARTMENT_OTHER): Payer: BC Managed Care – PPO | Admitting: Obstetrics & Gynecology

## 2021-11-04 ENCOUNTER — Encounter (HOSPITAL_BASED_OUTPATIENT_CLINIC_OR_DEPARTMENT_OTHER): Payer: Self-pay | Admitting: Obstetrics & Gynecology

## 2021-11-16 ENCOUNTER — Ambulatory Visit
Admission: RE | Admit: 2021-11-16 | Discharge: 2021-11-16 | Disposition: A | Discharge status: Home / Self Care | Payer: BC Managed Care – PPO | Source: Ambulatory Visit | Attending: Obstetrics & Gynecology | Admitting: Obstetrics & Gynecology

## 2021-11-16 DIAGNOSIS — N6489 Other specified disorders of breast: Secondary | ICD-10-CM

## 2021-11-25 ENCOUNTER — Emergency Department (HOSPITAL_COMMUNITY): Payer: BC Managed Care – PPO

## 2021-11-25 ENCOUNTER — Observation Stay (HOSPITAL_COMMUNITY)
Admission: EM | Admit: 2021-11-25 | Discharge: 2021-11-26 | Disposition: A | Discharge status: Home / Self Care | Payer: BC Managed Care – PPO | Attending: General Surgery | Admitting: General Surgery

## 2021-11-25 ENCOUNTER — Encounter (HOSPITAL_COMMUNITY): Payer: Self-pay

## 2021-11-25 ENCOUNTER — Other Ambulatory Visit: Payer: Self-pay

## 2021-11-25 DIAGNOSIS — J45909 Unspecified asthma, uncomplicated: Secondary | ICD-10-CM | POA: Diagnosis not present

## 2021-11-25 DIAGNOSIS — K810 Acute cholecystitis: Secondary | ICD-10-CM

## 2021-11-25 DIAGNOSIS — K819 Cholecystitis, unspecified: Secondary | ICD-10-CM | POA: Diagnosis present

## 2021-11-25 DIAGNOSIS — K8012 Calculus of gallbladder with acute and chronic cholecystitis without obstruction: Principal | ICD-10-CM | POA: Insufficient documentation

## 2021-11-25 DIAGNOSIS — R1011 Right upper quadrant pain: Secondary | ICD-10-CM | POA: Diagnosis present

## 2021-11-25 DIAGNOSIS — Z79899 Other long term (current) drug therapy: Secondary | ICD-10-CM | POA: Diagnosis not present

## 2021-11-25 LAB — COMPREHENSIVE METABOLIC PANEL
ALT: 15 U/L (ref 0–44)
AST: 14 U/L — ABNORMAL LOW (ref 15–41)
Albumin: 4.4 g/dL (ref 3.5–5.0)
Alkaline Phosphatase: 45 U/L (ref 38–126)
Anion gap: 5 (ref 5–15)
BUN: 15 mg/dL (ref 6–20)
CO2: 26 mmol/L (ref 22–32)
Calcium: 9.5 mg/dL (ref 8.9–10.3)
Chloride: 107 mmol/L (ref 98–111)
Creatinine, Ser: 0.87 mg/dL (ref 0.44–1.00)
GFR, Estimated: >60 mL/min (ref >60)
Glucose, Bld: 102 mg/dL — ABNORMAL HIGH (ref 70–99)
Potassium: 4.5 mmol/L (ref 3.5–5.1)
Sodium: 138 mmol/L (ref 135–145)
Total Bilirubin: 0.7 mg/dL (ref 0.3–1.2)
Total Protein: 7.6 g/dL (ref 6.5–8.1)

## 2021-11-25 LAB — URINALYSIS, ROUTINE W REFLEX MICROSCOPIC
Bacteria, UA: NONE SEEN
Bilirubin Urine: NEGATIVE
Glucose, UA: NEGATIVE mg/dL
Ketones, ur: NEGATIVE mg/dL
Leukocytes,Ua: NEGATIVE
Nitrite: NEGATIVE
Protein, ur: NEGATIVE mg/dL
Specific Gravity, Urine: 1.021 (ref 1.005–1.030)
pH: 5 (ref 5.0–8.0)

## 2021-11-25 LAB — I-STAT BETA HCG BLOOD, ED (MC, WL, AP ONLY): I-stat hCG, quantitative: <5 m[IU]/mL (ref <5)

## 2021-11-25 LAB — LIPASE, BLOOD: Lipase: 27 U/L (ref 11–51)

## 2021-11-25 LAB — CBC WITH DIFFERENTIAL/PLATELET
Abs Immature Granulocytes: 0.02 10*3/uL (ref 0.00–0.07)
Basophils Absolute: 0 10*3/uL (ref 0.0–0.1)
Basophils Relative: 0 %
Eosinophils Absolute: 0 10*3/uL (ref 0.0–0.5)
Eosinophils Relative: 0 %
HCT: 41.1 % (ref 36.0–46.0)
Hemoglobin: 13.6 g/dL (ref 12.0–15.0)
Immature Granulocytes: 0 %
Lymphocytes Relative: 16 %
Lymphs Abs: 1.5 10*3/uL (ref 0.7–4.0)
MCH: 30.2 pg (ref 26.0–34.0)
MCHC: 33.1 g/dL (ref 30.0–36.0)
MCV: 91.1 fL (ref 80.0–100.0)
Monocytes Absolute: 0.5 10*3/uL (ref 0.1–1.0)
Monocytes Relative: 5 %
Neutro Abs: 7.4 10*3/uL (ref 1.7–7.7)
Neutrophils Relative %: 79 %
Platelets: 253 10*3/uL (ref 150–400)
RBC: 4.51 MIL/uL (ref 3.87–5.11)
RDW: 13.4 % (ref 11.5–15.5)
WBC: 9.4 10*3/uL (ref 4.0–10.5)
nRBC: 0 % (ref 0.0–0.2)

## 2021-11-25 MED ORDER — OXYCODONE HCL 5 MG PO TABS: 5.0000 mg | ORAL_TABLET | ORAL | Status: DC | PRN

## 2021-11-25 MED ORDER — ENOXAPARIN SODIUM 40 MG/0.4ML IJ SOSY
40.0000 mg | PREFILLED_SYRINGE | INTRAMUSCULAR | Status: DC
  Administered 2021-11-25: 40 mg via SUBCUTANEOUS
  Filled 2021-11-25: qty 0.4

## 2021-11-25 MED ORDER — ONDANSETRON HCL 4 MG/2ML IJ SOLN: 4.0000 mg | Freq: Four times a day (QID) | INTRAMUSCULAR | Status: DC | PRN

## 2021-11-25 MED ORDER — SODIUM CHLORIDE (PF) 0.9 % IJ SOLN
INTRAMUSCULAR | Status: AC
  Filled 2021-11-25: qty 50

## 2021-11-25 MED ORDER — IOHEXOL 300 MG/ML  SOLN
100.0000 mL | Freq: Once | INTRAMUSCULAR | Status: AC | PRN
  Administered 2021-11-25: 100 mL via INTRAVENOUS

## 2021-11-25 MED ORDER — ACETAMINOPHEN 650 MG RE SUPP: 650.0000 mg | Freq: Four times a day (QID) | RECTAL | Status: DC | PRN

## 2021-11-25 MED ORDER — ESCITALOPRAM OXALATE 10 MG PO TABS
10.0000 mg | ORAL_TABLET | Freq: Once | ORAL | Status: AC
  Administered 2021-11-25: 10 mg via ORAL
  Filled 2021-11-25: qty 1

## 2021-11-25 MED ORDER — SODIUM CHLORIDE 0.9 % IV SOLN: 2.0000 g | INTRAVENOUS | Status: DC

## 2021-11-25 MED ORDER — ONDANSETRON 4 MG PO TBDP: 4.0000 mg | ORAL_TABLET | Freq: Four times a day (QID) | ORAL | Status: DC | PRN

## 2021-11-25 MED ORDER — MORPHINE SULFATE (PF) 2 MG/ML IV SOLN: 1.0000 mg | INTRAVENOUS | Status: DC | PRN

## 2021-11-25 MED ORDER — SODIUM CHLORIDE 0.9 % IV SOLN
2.0000 g | Freq: Once | INTRAVENOUS | Status: AC
  Administered 2021-11-25: 2 g via INTRAVENOUS
  Filled 2021-11-25: qty 20

## 2021-11-25 MED ORDER — PANTOPRAZOLE SODIUM 40 MG PO TBEC
40.0000 mg | DELAYED_RELEASE_TABLET | Freq: Every day | ORAL | Status: DC
  Administered 2021-11-25: 40 mg via ORAL
  Filled 2021-11-25: qty 1

## 2021-11-25 MED ORDER — ACETAMINOPHEN 325 MG PO TABS: 650.0000 mg | ORAL_TABLET | Freq: Four times a day (QID) | ORAL | Status: DC | PRN

## 2021-11-25 MED ORDER — SODIUM CHLORIDE 0.9 % IV SOLN: INTRAVENOUS | Status: DC

## 2021-11-25 NOTE — H&P (Signed)
Alexis Jones is an 41 y.o. female.   Chief Complaint: ab pain HPI: 56 yof with ab pain in ruq started at 5 am. Never had this before.  Some nausea, no emesis.  No change bowel function. Was not getting better so presented to UC where she was thought to have gb disease and sent to the ER. Had a low grade fever associated with pain.  Labs all normal.  Has a ct and [name] that show possible cholecystitis and gallstones. I was asked to see her.   Past Medical History:  Diagnosis Date   Anxiety    Asthma    Dyspnea    Female bladder prolapse    GERD (gastroesophageal reflux disease)    Hiatal hernia    High triglycerides    HLD (hyperlipidemia)    Left ovarian cyst    Low HDL (under 40)    Lower extremity edema    Microcytic anemia    PCOS (polycystic ovarian syndrome)    Prediabetes    Rectocele    Urinary incontinence    Vitamin D deficiency     Past Surgical History:  Procedure Laterality Date   BREAST REDUCTION SURGERY  2000   LIPOMA EXCISION  2015   left shoulder lipoma removed (2)   REDUCTION MAMMAPLASTY     TEAR DUCT PROBING  04/1979    Family History  Problem Relation Age of Onset   Hypertension Mother    Hyperlipidemia Mother    Thyroid disease Mother    Anxiety disorder Mother    Obesity Mother    Cancer Father        prostate cancer   Mitral valve prolapse Father    Obesity Father    Breast cancer Maternal Aunt    Ovarian cancer Maternal Grandmother    Skin cancer Maternal Grandfather    Lung cancer Maternal Grandfather    Social History:  reports that she has never smoked. She has never used smokeless tobacco. She reports that she does not drink alcohol and does not use drugs.  Allergies: No Known Allergies  No current facility-administered medications on file prior to encounter.   Current Outpatient Medications on File Prior to Encounter  Medication Sig Dispense Refill   buPROPion (WELLBUTRIN SR) 150 MG 12 hr tablet TAKE 1 TABLET BY MOUTH TWICE  A DAY 60 tablet 0   escitalopram (LEXAPRO) 10 MG tablet Take 1 tablet (10 mg total) by mouth daily. 90 tablet 4   ibuprofen (ADVIL,MOTRIN) 200 MG tablet Take 200 mg by mouth every 8 (eight) hours as needed.     levonorgestrel (MIRENA) 20 MCG/24HR IUD 1 each by Intrauterine route once. Placed 05/05/15     Multiple Vitamins-Minerals (WOMENS MULTI VITAMIN & MINERAL PO) Take 1 tablet by mouth daily.     omeprazole (PRILOSEC) 40 MG capsule TAKE 1 CAPSULE BY MOUTH EVERY DAY 90 capsule 1   spironolactone (ALDACTONE) 100 MG tablet Take 1 tablet by mouth daily.     Vitamin D, Ergocalciferol, (DRISDOL) 1.25 MG (50000 UNIT) CAPS capsule TAKE 1 CAPSULE (50,000 UNITS TOTAL) BY MOUTH EVERY 14 DAYS 2 capsule 0     Results for orders placed or performed during the hospital encounter of 11/25/21 (from the past 48 hour(s))  CBC with Differential     Status: None   Collection Time: 11/25/21  1:23 PM  Result Value Ref Range   WBC 9.4 4.0 - 10.5 K/uL   RBC 4.51 3.87 - 5.11 MIL/uL   Hemoglobin 13.6  12.0 - 15.0 g/dL   HCT 41.1 36.0 - 46.0 %   MCV 91.1 80.0 - 100.0 fL   MCH 30.2 26.0 - 34.0 pg   MCHC 33.1 30.0 - 36.0 g/dL   RDW 13.4 11.5 - 15.5 %   Platelets 253 150 - 400 K/uL   nRBC 0.0 0.0 - 0.2 %   Neutrophils Relative % 79 %   Neutro Abs 7.4 1.7 - 7.7 K/uL   Lymphocytes Relative 16 %   Lymphs Abs 1.5 0.7 - 4.0 K/uL   Monocytes Relative 5 %   Monocytes Absolute 0.5 0.1 - 1.0 K/uL   Eosinophils Relative 0 %   Eosinophils Absolute 0.0 0.0 - 0.5 K/uL   Basophils Relative 0 %   Basophils Absolute 0.0 0.0 - 0.1 K/uL   Immature Granulocytes 0 %   Abs Immature Granulocytes 0.02 0.00 - 0.07 K/uL    Comment: Performed at [facility], [place] [address], [place], [place] [address]  Comprehensive metabolic panel     Status: Abnormal   Collection Time: 11/25/21  1:23 PM  Result Value Ref Range   Sodium 138 135 - 145 mmol/L   Potassium 4.5 3.5 - 5.1 mmol/L   Chloride 107 98 - 111 mmol/L   CO2  26 22 - 32 mmol/L   Glucose, Bld 102 (H) 70 - 99 mg/dL    Comment: Glucose reference range applies only to samples taken after fasting for at least 8 hours.   BUN 15 6 - 20 mg/dL   Creatinine, Ser 0.87 0.44 - 1.00 mg/dL   Calcium 9.5 8.9 - 10.3 mg/dL   Total Protein 7.6 6.5 - 8.1 g/dL   Albumin 4.4 3.5 - 5.0 g/dL   AST 14 (L) 15 - 41 U/L   ALT 15 0 - 44 U/L   Alkaline Phosphatase 45 38 - 126 U/L   Total Bilirubin 0.7 0.3 - 1.2 mg/dL   GFR, Estimated >60 >60 mL/min    Comment: (NOTE) Calculated using the CKD-EPI Creatinine Equation (2021)    Anion gap 5 5 - 15    Comment: Performed at [facility], [place] [address] [place], [place] [address]  Lipase, blood     Status: None   Collection Time: 11/25/21  1:23 PM  Result Value Ref Range   Lipase 27 11 - 51 U/L    Comment: Performed at [facility], [place] [address], [place], [place] [address]  Urinalysis, Routine w reflex microscopic Urine, Clean Catch     Status: Abnormal   Collection Time: 11/25/21  1:24 PM  Result Value Ref Range   Color, Urine YELLOW YELLOW   APPearance CLEAR CLEAR   Specific Gravity, Urine 1.021 1.005 - 1.030   pH 5.0 5.0 - 8.0   Glucose, UA NEGATIVE NEGATIVE mg/dL   Hgb urine dipstick SMALL (A) NEGATIVE   Bilirubin Urine NEGATIVE NEGATIVE   Ketones, ur NEGATIVE NEGATIVE mg/dL   Protein, ur NEGATIVE NEGATIVE mg/dL   Nitrite NEGATIVE NEGATIVE   Leukocytes,Ua NEGATIVE NEGATIVE   RBC / HPF 0-5 0 - 5 RBC/hpf   WBC, UA 0-5 0 - 5 WBC/hpf   Bacteria, UA NONE SEEN NONE SEEN   Squamous Epithelial / LPF 0-5 0 - 5   Mucus PRESENT    Hyaline Casts, UA PRESENT     Comment: Performed at [facility], [place] [address] [place],  [address]  POC beta hCG blood, ED     Status:  None   Collection Time: 11/25/21  1:32 PM  Result Value Ref Range   I-stat hCG, quantitative <5.0 <5 mIU/mL   Comment 3            Comment:   GEST. AGE      CONC.  (mIU/mL)   <=1 WEEK         5 - 50     2 WEEKS       50 - 500     3 WEEKS       100 - 10,000     4 WEEKS     1,000 - 30,000        FEMALE AND NON-PREGNANT FEMALE:     LESS THAN 5 mIU/mL    US Abdomen Limited RUQ (LIVER/GB)  Result Date: 11/25/2021 CLINICAL DATA:  Right upper quadrant pain. EXAM: ULTRASOUND ABDOMEN LIMITED RIGHT UPPER QUADRANT COMPARISON:  None Available. FINDINGS: Gallbladder: A 2.8 cm shadowing echogenic gallstone is seen within the gallbladder lumen. The gallbladder wall measures 2.5 mm in thickness. No sonographic Murphy sign noted by sonographer. Common bile duct: Diameter: 1.7 Liver: No focal lesion identified. Diffusely increased echogenicity of the liver parenchyma is noted. Portal vein is patent on color Doppler imaging with normal direction of blood flow towards the liver. Other: None. IMPRESSION: 1. Cholelithiasis, without evidence of acute cholecystitis. 2. Hepatic steatosis without focal liver lesions. Electronically Signed   By: Virgina Norfolk M.D.   On: 11/25/2021 18:35   CT Abdomen Pelvis W Contrast  Result Date: 11/25/2021 CLINICAL DATA:  Acute abdominal pain. EXAM: CT ABDOMEN AND PELVIS WITH CONTRAST TECHNIQUE: Multidetector CT imaging of the abdomen and pelvis was performed using the standard protocol following bolus administration of intravenous contrast. RADIATION DOSE REDUCTION: This exam was performed according to the departmental dose-optimization program which includes automated exposure control, adjustment of the mA and/or kV according to patient size and/or use of iterative reconstruction technique. CONTRAST:  124m OMNIPAQUE IOHEXOL 300 MG/ML  SOLN COMPARISON:  None Available. FINDINGS: Lower chest: No acute abnormality. Hepatobiliary: The gallbladder is distended and multiple gallstones are present. There is mild gallbladder wall thickening. No biliary ductal dilatation identified. No focal liver lesions. Pancreas: Unremarkable. No pancreatic ductal dilatation or surrounding  inflammatory changes. Spleen: Normal in size without focal abnormality. Adrenals/Urinary Tract: Adrenal glands are unremarkable. Kidneys are normal, without renal calculi, focal lesion, or hydronephrosis. Bladder is unremarkable. Stomach/Bowel: Stomach is within normal limits. Appendix appears normal. No evidence of bowel wall thickening, distention, or inflammatory changes. Vascular/Lymphatic: No significant vascular findings are present. No enlarged abdominal or pelvic lymph nodes. Reproductive: There is an IUD in the uterus. The ovaries are within normal limits. Other: There is trace free fluid in the pelvis, likely physiologic. There is a small fat containing umbilical hernia. Musculoskeletal: No acute or significant osseous findings. IMPRESSION: 1. Distended gallbladder with gallstones and mild gallbladder wall thickening. Findings are concerning for acute cholecystitis. 2. Trace free fluid in the pelvis, likely physiologic. Electronically Signed   By: ARonney AstersM.D.   On: 11/25/2021 15:20    Review of Systems  Constitutional:  Positive for fever.  Gastrointestinal:  Positive for abdominal pain and nausea.  All other systems reviewed and are negative.   Blood pressure 131/65, pulse (!) 59, temperature 98.4 F (36.9 C), temperature source Oral, resp. rate 17, height '5\' 6"'$  (1.676 m), weight 106 kg, SpO2 94 %. Physical Exam Vitals reviewed.  Constitutional:      Appearance: She is well-developed.  Eyes:     General: No scleral icterus. Cardiovascular:     Rate and Rhythm: Normal rate and regular rhythm.  Pulmonary:     Effort: Pulmonary effort is normal.  Abdominal:     Palpations: Abdomen is soft.     Tenderness: There is abdominal tenderness in the right upper quadrant.     Hernia: No hernia is present.  Skin:    General: Skin is warm and dry.     Capillary Refill: Capillary refill takes less than 2 seconds.  Neurological:     General: No focal deficit present.     Mental Status:  She is alert.  Psychiatric:        Mood and Affect: Mood normal.        Behavior: Behavior normal.      Assessment/Plan Cholecystitis  Admission, abx, clears until MN, npo after mn Lap chole tomorrow I discussed the procedure in detail.  We discussed the risks and benefits of a laparoscopic cholecystectomy and possible cholangiogram including, but not limited to bleeding, infection, injury to surrounding structures such as the intestine or liver, bile leak, retained gallstones, need to convert to an open procedure, prolonged diarrhea, blood clots such as  DVT, common bile duct injury, anesthesia risks, and possible need for additional procedures.  The likelihood of improvement in symptoms and return to the patient's normal status is good. We discussed the typical post-operative recovery course.   Rolm Bookbinder, MD 11/25/2021, 7:20 PM

## 2021-11-25 NOTE — ED Provider Triage Note (Signed)
Emergency Medicine Provider Triage Evaluation Note  Alexis Jones , a 41 y.o. female  was evaluated in triage.  Pt complains of abdominal pain. Started 5 am this morning while sleeping. Located under bottom rib on the right side. Non radiating. Endorsing nausea, no vomiting. Seen by urgent care today. Concerned for gall bladder pathology. No fever. Given zofran for nausea. No chest pain or SOB. No hematuria, bloody stools or hemoptysis.  Review of Systems  Positive: See above Negative: See above  Physical Exam  BP (!) 142/78   Pulse 68   Temp 98.5 F (36.9 C)   Resp 20   Ht '5\' 6"'$  (1.676 m)   Wt 106 kg   SpO2 100%   BMI 37.72 kg/m  Gen:   Awake, no distress   Resp:  Normal effort  MSK:   Moves extremities without difficulty  Other:    Medical Decision Making  Medically screening exam initiated at 12:05 PM.  Appropriate orders placed.  Alexis Jones was informed that the remainder of the evaluation will be completed by another provider, this initial triage assessment does not replace that evaluation, and the importance of remaining in the ED until their evaluation is complete.  Abdominal labs and ct scan ab/pelvis   Harriet Pho, PA-C 11/25/21 1212

## 2021-11-25 NOTE — ED Provider Notes (Signed)
[facility] DEPT Provider Note   CSN: [id number] Arrival date & time: 11/25/21  1124     History  Chief Complaint  Patient presents with   Abdominal Pain    HPI Alexis Jones , a 41 y.o. female  presenting for abdominal pain. Started 5 am this morning while sleeping. Located under bottom rib on the right side. Non radiating. Endorsing nausea, no vomiting. Seen by urgent care today. Concerned for gall bladder pathology. No fever. Given zofran for nausea. No chest pain or SOB. No hematuria, bloody stools or hemoptysis.   Abdominal Pain      Home Medications Prior to Admission medications   Medication Sig Start Date End Date Taking? Authorizing Provider  buPROPion (WELLBUTRIN SR) 150 MG 12 hr tablet TAKE 1 TABLET BY MOUTH TWICE A DAY 11/24/20   Dennard Nip D, MD  escitalopram (LEXAPRO) 10 MG tablet Take 1 tablet (10 mg total) by mouth daily. 09/24/20   Megan Salon, MD  ibuprofen (ADVIL,MOTRIN) 200 MG tablet Take 200 mg by mouth every 8 (eight) hours as needed.    [provider]  levonorgestrel (MIRENA) 20 MCG/24HR IUD 1 each by Intrauterine route once. Placed 05/05/15    [provider]  Multiple Vitamins-Minerals (WOMENS MULTI VITAMIN & MINERAL PO) Take 1 tablet by mouth daily.    [provider]  omeprazole (PRILOSEC) 40 MG capsule TAKE 1 CAPSULE BY MOUTH EVERY DAY 08/27/21   Megan Salon, MD  spironolactone (ALDACTONE) 100 MG tablet Take 1 tablet by mouth daily. 04/20/18   [provider]  Vitamin D, Ergocalciferol, (DRISDOL) 1.25 MG (50000 UNIT) CAPS capsule TAKE 1 CAPSULE (50,000 UNITS TOTAL) BY MOUTH EVERY 14 DAYS 12/10/20   Dennard Nip D, MD      Allergies    Patient has no known allergies.    Review of Systems   Review of Systems  Gastrointestinal:  Positive for abdominal pain.    Physical Exam Updated Vital Signs BP 131/65   Pulse (!) 59   Temp 98.4 F (36.9 C) (Oral)   Resp 17    Ht '5\' 6"'$  (1.676 m)   Wt 106 kg   SpO2 94%   BMI 37.72 kg/m  Physical Exam Vitals and nursing note reviewed.  HENT:     Head: Normocephalic and atraumatic.     Mouth/Throat:     Mouth: Mucous membranes are moist.  Eyes:     General:        Right eye: No discharge.        Left eye: No discharge.     Conjunctiva/sclera: Conjunctivae normal.  Cardiovascular:     Rate and Rhythm: Normal rate and regular rhythm.     Pulses: Normal pulses.     Heart sounds: Normal heart sounds.  Pulmonary:     Effort: Pulmonary effort is normal.     Breath sounds: Normal breath sounds.  Abdominal:     General: Abdomen is flat.     Palpations: Abdomen is soft.     Tenderness: There is abdominal tenderness in the right upper quadrant. There is no right CVA tenderness or left CVA tenderness.  Skin:    General: Skin is warm and dry.  Neurological:     General: No focal deficit present.  Psychiatric:        Mood and Affect: Mood normal.     ED Results / Procedures / Treatments   Labs (all labs ordered are listed, but only abnormal results  are displayed) Labs Reviewed  COMPREHENSIVE METABOLIC PANEL - Abnormal; Notable for the following components:      Result Value   Glucose, Bld 102 (*)    AST 14 (*)    All other components within normal limits  URINALYSIS, ROUTINE W REFLEX MICROSCOPIC - Abnormal; Notable for the following components:   Hgb urine dipstick SMALL (*)    All other components within normal limits  CBC WITH DIFFERENTIAL/PLATELET  LIPASE, BLOOD  I-STAT BETA HCG BLOOD, ED ([facility], [facility], [facility] ONLY)    EKG None  Radiology US Abdomen Limited RUQ (LIVER/GB)  Result Date: 11/25/2021 CLINICAL DATA:  Right upper quadrant pain. EXAM: ULTRASOUND ABDOMEN LIMITED RIGHT UPPER QUADRANT COMPARISON:  None Available. FINDINGS: Gallbladder: A 2.8 cm shadowing echogenic gallstone is seen within the gallbladder lumen. The gallbladder wall measures 2.5 mm in thickness. No sonographic Murphy sign noted by  sonographer. Common bile duct: Diameter: 1.7 Liver: No focal lesion identified. Diffusely increased echogenicity of the liver parenchyma is noted. Portal vein is patent on color Doppler imaging with normal direction of blood flow towards the liver. Other: None. IMPRESSION: 1. Cholelithiasis, without evidence of acute cholecystitis. 2. Hepatic steatosis without focal liver lesions. Electronically Signed   By: Virgina Norfolk M.D.   On: 11/25/2021 18:35   CT Abdomen Pelvis W Contrast  Result Date: 11/25/2021 CLINICAL DATA:  Acute abdominal pain. EXAM: CT ABDOMEN AND PELVIS WITH CONTRAST TECHNIQUE: Multidetector CT imaging of the abdomen and pelvis was performed using the standard protocol following bolus administration of intravenous contrast. RADIATION DOSE REDUCTION: This exam was performed according to the departmental dose-optimization program which includes automated exposure control, adjustment of the mA and/or kV according to patient size and/or use of iterative reconstruction technique. CONTRAST:  119m OMNIPAQUE IOHEXOL 300 MG/ML  SOLN COMPARISON:  None Available. FINDINGS: Lower chest: No acute abnormality. Hepatobiliary: The gallbladder is distended and multiple gallstones are present. There is mild gallbladder wall thickening. No biliary ductal dilatation identified. No focal liver lesions. Pancreas: Unremarkable. No pancreatic ductal dilatation or surrounding inflammatory changes. Spleen: Normal in size without focal abnormality. Adrenals/Urinary Tract: Adrenal glands are unremarkable. Kidneys are normal, without renal calculi, focal lesion, or hydronephrosis. Bladder is unremarkable. Stomach/Bowel: Stomach is within normal limits. Appendix appears normal. No evidence of bowel wall thickening, distention, or inflammatory changes. Vascular/Lymphatic: No significant vascular findings are present. No enlarged abdominal or pelvic lymph nodes. Reproductive: There is an IUD in the uterus. The ovaries are  within normal limits. Other: There is trace free fluid in the pelvis, likely physiologic. There is a small fat containing umbilical hernia. Musculoskeletal: No acute or significant osseous findings. IMPRESSION: 1. Distended gallbladder with gallstones and mild gallbladder wall thickening. Findings are concerning for acute cholecystitis. 2. Trace free fluid in the pelvis, likely physiologic. Electronically Signed   By: ARonney AstersM.D.   On: 11/25/2021 15:20    Procedures Procedures    Medications Ordered in ED Medications  enoxaparin (LOVENOX) injection 40 mg (has no administration in time range)  0.9 %  sodium chloride infusion (has no administration in time range)  cefTRIAXone (ROCEPHIN) 2 g in sodium chloride 0.9 % 100 mL IVPB (has no administration in time range)  acetaminophen (TYLENOL) tablet 650 mg (has no administration in time range)    Or  acetaminophen (TYLENOL) suppository 650 mg (has no administration in time range)  oxyCODONE (Oxy IR/ROXICODONE) immediate release tablet 5-10 mg (has no administration in time range)  morphine (PF) 2 MG/ML injection 1  mg (has no administration in time range)  ondansetron (ZOFRAN-ODT) disintegrating tablet 4 mg (has no administration in time range)    Or  ondansetron (ZOFRAN) injection 4 mg (has no administration in time range)  iohexol (OMNIPAQUE) 300 MG/ML solution 100 mL (100 mLs Intravenous Contrast Given 11/25/21 1508)  sodium chloride (PF) 0.9 % injection (  Given by Other 11/25/21 1743)  cefTRIAXone (ROCEPHIN) 2 g in sodium chloride 0.9 % 100 mL IVPB (0 g Intravenous Stopped 11/25/21 1816)    ED Course/ Medical Decision Making/ A&P                           Medical Decision Making Amount and/or Complexity of Data Reviewed Labs: ordered. Radiology: ordered.   This patient presents to the ED for concern of abdominal pain, this involves a number of treatment options, and is a complaint that carries with it a high risk of complications  and morbidity.  The differential diagnosis includes gallbladder pathology, hepatitis, pancreatitis, ACS, and pyelonephritis.    Co morbidities: Discussed in HPI   EMR reviewed including pt PMHx, past surgical history and past visits to ER.   See HPI for more details   Lab Tests:   I ordered and independently interpreted labs. Labs notable for hyperglycemia   Imaging Studies:  Abnormal findings. I personally reviewed all imaging studies. Imaging notable for CT abdomen/pelvis 1. Distended gallbladder with gallstones and mild gallbladder wall  thickening. Findings are concerning for acute cholecystitis.      Cardiac Monitoring:  The patient was maintained on a cardiac monitor.  I personally viewed and interpreted the cardiac monitored which showed an underlying rhythm of: NSR NA   Medicines ordered:  I ordered medication including Rocephin for acute cholecystitis. Reevaluation of the patient after these medicines showed that the patient improved I have reviewed the patients home medicines and have made adjustments as needed     Consults/Attending Physician   I requested consultation with Dr. Donne Hazel,  and discussed lab and imaging findings as well as pertinent plan - they recommend: Admission to the hospital for surgical management of acute cholecystitis   Reevaluation:  After the interventions noted above I re-evaluated patient and found that they have :improved.  Upon reevaluation of her pain.  Patient stated that she was still tender in her right upper quadrant but that has improved since she had presented to the ED.    Problem List / ED Course: Presented for right upper quadrant pain started this morning.  CT scan did reveal concerns for acute cholecystitis.  Consulted surgery which advised to admit her for surgical management of her acute cholecystitis.  Upon reevaluation of her pain, patient stated that it was improved.  For this reason I did not offer  pain medication.  Ordered Rocephin for infection related to acute cholecystitis.   Dispostion:  After consideration of the diagnostic results and the patients response to treatment, I feel that the patient would benefit from admission to the hospital for surgical management of her acute cholecystitis.         Final Clinical Impression(s) / ED Diagnoses Final diagnoses:  Cholecystitis, acute    Rx / DC Orders ED Discharge Orders     None         Harriet Pho, PA-C 11/25/21 Doran Heater    Carmin Muskrat, MD 11/25/21 [phone]

## 2021-11-25 NOTE — ED Triage Notes (Signed)
Pt reports woke from sleep @ 0500 with right sided abd pain with nausea. Went to UC and was told she had trace of blood in urinalysis and sent to ER to R/U gallstones.  Zofran admin at urgent care.

## 2021-11-25 NOTE — Plan of Care (Signed)
  Problem: Education: Goal: Knowledge of General Education information will improve Description: Including pain rating scale, medication(s)/side effects and non-pharmacologic comfort measures Outcome: Progressing   Problem: Clinical Measurements: Goal: Ability to maintain clinical measurements within normal limits will improve Outcome: Progressing Goal: Will remain free from infection Outcome: Progressing   Problem: Activity: Goal: Risk for activity intolerance will decrease Outcome: Progressing   Problem: Coping: Goal: Level of anxiety will decrease Outcome: Progressing   Problem: Pain Managment: Goal: General experience of comfort will improve Outcome: Progressing   Problem: Safety: Goal: Ability to remain free from injury will improve Outcome: Progressing   Problem: Skin Integrity: Goal: Risk for impaired skin integrity will decrease Outcome: Progressing

## 2021-11-26 ENCOUNTER — Other Ambulatory Visit: Payer: Self-pay

## 2021-11-26 ENCOUNTER — Observation Stay (HOSPITAL_COMMUNITY): Payer: BC Managed Care – PPO | Admitting: Anesthesiology

## 2021-11-26 ENCOUNTER — Encounter (HOSPITAL_COMMUNITY): Payer: Self-pay

## 2021-11-26 ENCOUNTER — Encounter (HOSPITAL_COMMUNITY)
Admission: EM | Disposition: A | Discharge status: Home / Self Care | Payer: Self-pay | Source: Home / Self Care | Attending: Emergency Medicine

## 2021-11-26 HISTORY — PX: CHOLECYSTECTOMY: SHX55

## 2021-11-26 LAB — SURGICAL PCR SCREEN
MRSA, PCR: NEGATIVE
Staphylococcus aureus: NEGATIVE

## 2021-11-26 SURGERY — LAPAROSCOPIC CHOLECYSTECTOMY WITH INTRAOPERATIVE CHOLANGIOGRAM
Anesthesia: General

## 2021-11-26 MED ORDER — FENTANYL CITRATE PF 50 MCG/ML IJ SOSY
PREFILLED_SYRINGE | INTRAMUSCULAR | Status: AC
  Filled 2021-11-26: qty 2

## 2021-11-26 MED ORDER — ONDANSETRON HCL 4 MG/2ML IJ SOLN
INTRAMUSCULAR | Status: DC | PRN
  Administered 2021-11-26: 4 mg via INTRAVENOUS

## 2021-11-26 MED ORDER — ONDANSETRON HCL 4 MG/2ML IJ SOLN
INTRAMUSCULAR | Status: AC
  Filled 2021-11-26: qty 2

## 2021-11-26 MED ORDER — OXYCODONE HCL 5 MG PO TABS: 5.0000 mg | ORAL_TABLET | Freq: Four times a day (QID) | ORAL | 0 refills | Status: DC | PRN

## 2021-11-26 MED ORDER — CELECOXIB 200 MG PO CAPS
200.0000 mg | ORAL_CAPSULE | Freq: Once | ORAL | Status: AC
  Administered 2021-11-26: 200 mg via ORAL
  Filled 2021-11-26: qty 1

## 2021-11-26 MED ORDER — EPHEDRINE SULFATE-NACL 50-0.9 MG/10ML-% IV SOSY
PREFILLED_SYRINGE | INTRAVENOUS | Status: DC | PRN
  Administered 2021-11-26: 10 mg via INTRAVENOUS
  Administered 2021-11-26: 15 mg via INTRAVENOUS

## 2021-11-26 MED ORDER — FENTANYL CITRATE (PF) 100 MCG/2ML IJ SOLN
INTRAMUSCULAR | Status: AC
  Filled 2021-11-26: qty 2

## 2021-11-26 MED ORDER — PROPOFOL 10 MG/ML IV BOLUS
INTRAVENOUS | Status: AC
  Filled 2021-11-26: qty 20

## 2021-11-26 MED ORDER — LIDOCAINE HCL (CARDIAC) PF 100 MG/5ML IV SOSY
PREFILLED_SYRINGE | INTRAVENOUS | Status: DC | PRN
  Administered 2021-11-26: 100 mg via INTRAVENOUS

## 2021-11-26 MED ORDER — OXYCODONE HCL 5 MG/5ML PO SOLN: 5.0000 mg | Freq: Once | ORAL | Status: DC | PRN

## 2021-11-26 MED ORDER — SCOPOLAMINE 1 MG/3DAYS TD PT72
1.0000 | MEDICATED_PATCH | TRANSDERMAL | Status: DC
  Administered 2021-11-26: 1.5 mg via TRANSDERMAL
  Filled 2021-11-26: qty 1

## 2021-11-26 MED ORDER — OXYCODONE HCL 5 MG PO TABS: 5.0000 mg | ORAL_TABLET | ORAL | Status: DC | PRN

## 2021-11-26 MED ORDER — DOCUSATE SODIUM 100 MG PO CAPS: 100.0000 mg | ORAL_CAPSULE | Freq: Two times a day (BID) | ORAL | 0 refills | Status: DC

## 2021-11-26 MED ORDER — ACETAMINOPHEN 500 MG PO TABS
1000.0000 mg | ORAL_TABLET | Freq: Once | ORAL | Status: AC
  Administered 2021-11-26: 1000 mg via ORAL
  Filled 2021-11-26: qty 2

## 2021-11-26 MED ORDER — OXYCODONE HCL 5 MG PO TABS: 5.0000 mg | ORAL_TABLET | Freq: Once | ORAL | Status: DC | PRN

## 2021-11-26 MED ORDER — MIDAZOLAM HCL 5 MG/5ML IJ SOLN
INTRAMUSCULAR | Status: DC | PRN
  Administered 2021-11-26: 2 mg via INTRAVENOUS

## 2021-11-26 MED ORDER — FENTANYL CITRATE (PF) 100 MCG/2ML IJ SOLN
INTRAMUSCULAR | Status: DC | PRN
  Administered 2021-11-26: 100 ug via INTRAVENOUS
  Administered 2021-11-26 (×2): 50 ug via INTRAVENOUS

## 2021-11-26 MED ORDER — SUGAMMADEX SODIUM 200 MG/2ML IV SOLN
INTRAVENOUS | Status: DC | PRN
  Administered 2021-11-26: 200 mg via INTRAVENOUS

## 2021-11-26 MED ORDER — LACTATED RINGERS IV SOLN: INTRAVENOUS | Status: DC

## 2021-11-26 MED ORDER — INDOCYANINE GREEN 25 MG IV SOLR: 7.5000 mg | Freq: Once | INTRAVENOUS | Status: DC

## 2021-11-26 MED ORDER — LACTATED RINGERS IV SOLN: INTRAVENOUS | Status: DC | PRN

## 2021-11-26 MED ORDER — PROMETHAZINE HCL 25 MG/ML IJ SOLN: 6.2500 mg | INTRAMUSCULAR | Status: DC | PRN

## 2021-11-26 MED ORDER — LACTATED RINGERS IR SOLN
Status: DC | PRN
  Administered 2021-11-26: 1000 mL

## 2021-11-26 MED ORDER — KETOROLAC TROMETHAMINE 30 MG/ML IJ SOLN
INTRAMUSCULAR | Status: AC
  Filled 2021-11-26: qty 1

## 2021-11-26 MED ORDER — KETOROLAC TROMETHAMINE 30 MG/ML IJ SOLN
INTRAMUSCULAR | Status: DC | PRN
  Administered 2021-11-26: 30 mg via INTRAVENOUS

## 2021-11-26 MED ORDER — STERILE WATER FOR INJECTION IJ SOLN
INTRAMUSCULAR | Status: DC | PRN
  Administered 2021-11-26: 1 mL via INTRAVENOUS

## 2021-11-26 MED ORDER — ROCURONIUM BROMIDE 100 MG/10ML IV SOLN
INTRAVENOUS | Status: DC | PRN
  Administered 2021-11-26: 70 mg via INTRAVENOUS

## 2021-11-26 MED ORDER — PROPOFOL 10 MG/ML IV BOLUS
INTRAVENOUS | Status: DC | PRN
  Administered 2021-11-26: 170 mg via INTRAVENOUS

## 2021-11-26 MED ORDER — DEXAMETHASONE SODIUM PHOSPHATE 10 MG/ML IJ SOLN
INTRAMUSCULAR | Status: AC
  Filled 2021-11-26: qty 1

## 2021-11-26 MED ORDER — DEXAMETHASONE SODIUM PHOSPHATE 10 MG/ML IJ SOLN
INTRAMUSCULAR | Status: DC | PRN
  Administered 2021-11-26: 10 mg via INTRAVENOUS

## 2021-11-26 MED ORDER — MIDAZOLAM HCL 2 MG/2ML IJ SOLN
INTRAMUSCULAR | Status: AC
  Filled 2021-11-26: qty 2

## 2021-11-26 MED ORDER — FENTANYL CITRATE PF 50 MCG/ML IJ SOSY
25.0000 ug | PREFILLED_SYRINGE | INTRAMUSCULAR | Status: DC | PRN
  Administered 2021-11-26 (×2): 50 ug via INTRAVENOUS

## 2021-11-26 MED ORDER — BUPIVACAINE-EPINEPHRINE (PF) 0.25% -1:200000 IJ SOLN
INTRAMUSCULAR | Status: AC
  Filled 2021-11-26: qty 30

## 2021-11-26 MED ORDER — 0.9 % SODIUM CHLORIDE (POUR BTL) OPTIME
TOPICAL | Status: DC | PRN
  Administered 2021-11-26: 1000 mL

## 2021-11-26 MED ORDER — MORPHINE SULFATE (PF) 2 MG/ML IV SOLN: 1.0000 mg | INTRAVENOUS | Status: DC | PRN

## 2021-11-26 MED ORDER — BUPIVACAINE-EPINEPHRINE 0.25% -1:200000 IJ SOLN
INTRAMUSCULAR | Status: DC | PRN
  Administered 2021-11-26: 30 mL

## 2021-11-26 MED ORDER — CHLORHEXIDINE GLUCONATE 0.12 % MT SOLN
15.0000 mL | Freq: Once | OROMUCOSAL | Status: AC
  Administered 2021-11-26: 15 mL via OROMUCOSAL

## 2021-11-26 SURGICAL SUPPLY — 44 items
ADH SKN CLS APL DERMABOND .7 (GAUZE/BANDAGES/DRESSINGS) ×1
APL PRP STRL LF DISP 70% ISPRP (MISCELLANEOUS) ×1
APPLIER CLIP 5 13 M/L LIGAMAX5 (MISCELLANEOUS) ×1
APR CLP MED LRG 5 ANG JAW (MISCELLANEOUS) ×1
BAG COUNTER SPONGE SURGICOUNT (BAG) IMPLANT
BAG SPNG CNTER NS LX DISP (BAG)
CABLE HIGH FREQUENCY MONO STRZ (ELECTRODE) ×2 IMPLANT
CHLORAPREP W/TINT 26 (MISCELLANEOUS) ×2 IMPLANT
CLIP APPLIE 5 13 M/L LIGAMAX5 (MISCELLANEOUS) ×2 IMPLANT
COVER MAYO STAND XLG (MISCELLANEOUS) ×2 IMPLANT
COVER SURGICAL LIGHT HANDLE (MISCELLANEOUS) ×4 IMPLANT
DERMABOND ADVANCED .7 DNX12 (GAUZE/BANDAGES/DRESSINGS) ×2 IMPLANT
DEVICE TROCAR PUNCTURE CLOSURE (ENDOMECHANICALS) IMPLANT
DRAPE C-ARM 42X120 X-RAY (DRAPES) IMPLANT
DRAPE LAPAROSCOPIC ABDOMINAL (DRAPES) ×2 IMPLANT
ELECT REM PT RETURN 15FT ADLT (MISCELLANEOUS) ×2 IMPLANT
GLOVE BIO SURGEON STRL SZ 6.5 (GLOVE) ×2 IMPLANT
GLOVE BIOGEL PI IND STRL 7.0 (GLOVE) ×2 IMPLANT
GLOVE INDICATOR 6.5 STRL GRN (GLOVE) ×2 IMPLANT
GOWN STRL REUS W/ TWL XL LVL3 (GOWN DISPOSABLE) ×6 IMPLANT
GOWN STRL REUS W/TWL XL LVL3 (GOWN DISPOSABLE) ×3
IRRIG SUCT STRYKERFLOW 2 WTIP (MISCELLANEOUS) ×1
IRRIGATION SUCT STRKRFLW 2 WTP (MISCELLANEOUS) ×2 IMPLANT
IV CATH 14GX2 1/4 (CATHETERS) IMPLANT
KIT BASIN OR (CUSTOM PROCEDURE TRAY) ×2 IMPLANT
KIT TURNOVER KIT A (KITS) IMPLANT
PENCIL SMOKE EVACUATOR (MISCELLANEOUS) IMPLANT
SCISSORS LAP 5X35 DISP (ENDOMECHANICALS) ×2 IMPLANT
SET CHOLANGIOGRAPH MIX (MISCELLANEOUS) IMPLANT
SET TUBE SMOKE EVAC HIGH FLOW (TUBING) ×2 IMPLANT
SLEEVE Z-THREAD 5X100MM (TROCAR) ×4 IMPLANT
SUT MNCRL AB 4-0 PS2 18 (SUTURE) IMPLANT
SUT VIC AB 2-0 SH 27 (SUTURE)
SUT VIC AB 2-0 SH 27X BRD (SUTURE) IMPLANT
SUT VIC AB 4-0 PS2 18 (SUTURE) ×2 IMPLANT
SUT VICRYL 0 UR6 27IN ABS (SUTURE) IMPLANT
SYS BAG RETRIEVAL 10MM (BASKET) ×1
SYSTEM BAG RETRIEVAL 10MM (BASKET) ×2 IMPLANT
TOWEL OR 17X26 10 PK STRL BLUE (TOWEL DISPOSABLE) ×2 IMPLANT
TOWEL OR NON WOVEN STRL DISP B (DISPOSABLE) ×2 IMPLANT
TRAY LAPAROSCOPIC (CUSTOM PROCEDURE TRAY) ×2 IMPLANT
TROCAR 11X100 Z THREAD (TROCAR) IMPLANT
TROCAR BALLN 12MMX100 BLUNT (TROCAR) IMPLANT
TROCAR Z-THREAD OPTICAL 5X100M (TROCAR) ×2 IMPLANT

## 2021-11-26 NOTE — Discharge Instructions (Signed)
[facility]  Please arrive at least 30 min before your appointment to complete your check in paperwork.  If you are unable to arrive 30 min prior to your appointment time we may have to cancel or reschedule you. LAPAROSCOPIC SURGERY: POST OP INSTRUCTIONS Always review your discharge instruction sheet given to you by the facility where your surgery was performed. IF YOU HAVE DISABILITY OR FAMILY LEAVE FORMS, YOU MUST BRING THEM TO THE OFFICE FOR PROCESSING.   DO NOT GIVE THEM TO YOUR DOCTOR.  PAIN CONTROL  First take acetaminophen (Tylenol) AND/or ibuprofen (Advil) to control your pain after surgery.  Follow directions on package.  Taking acetaminophen (Tylenol) and/or ibuprofen (Advil) regularly after surgery will help to control your pain and lower the amount of prescription pain medication you may need.  You should not take more than 4,000 mg (4 grams) of acetaminophen (Tylenol) in 24 hours.  You should not take ibuprofen (Advil), aleve, motrin, naprosyn or other NSAIDS if you have a history of stomach ulcers or chronic kidney disease.  A prescription for pain medication may be given to you upon discharge.  Take your pain medication as prescribed, if you still have uncontrolled pain after taking acetaminophen (Tylenol) or ibuprofen (Advil). Use ice packs to help control pain. If you need a refill on your pain medication, please contact your pharmacy.  They will contact our office to request authorization. Prescriptions will not be filled after 5pm or on week-ends.  HOME MEDICATIONS Take your usually prescribed medications unless otherwise directed.  DIET You should follow a light diet the first few days after arrival home.  Be sure to include lots of fluids daily. Avoid fatty, fried foods.   CONSTIPATION It is common to experience some constipation after surgery and if you are taking pain medication.  Increasing fluid intake and taking a stool softener (such as Colace)  will usually help or prevent this problem from occurring.  A mild laxative (Milk of Magnesia or Miralax) should be taken according to package instructions if there are no bowel movements after 48 hours.  WOUND/INCISION CARE Most patients will experience some swelling and bruising in the area of the incisions.  Ice packs will help.  Swelling and bruising can take several days to resolve.  Unless discharge instructions indicate otherwise, follow guidelines below  STERI-STRIPS - you may remove your outer bandages 48 hours after surgery, and you may shower at that time.  You have steri-strips (small skin tapes) in place directly over the incision.  These strips should be left on the skin for 7-10 days.   DERMABOND/SKIN GLUE - you may shower in 24 hours.  The glue will flake off over the next 2-3 weeks. Any sutures or staples will be removed at the office during your follow-up visit.  ACTIVITIES You may resume regular (light) daily activities beginning the next day--such as daily self-care, walking, climbing stairs--gradually increasing activities as tolerated.  You may have sexual intercourse when it is comfortable.  Refrain from any heavy lifting or straining until approved by your doctor. You may drive when you are no longer taking prescription pain medication, you can comfortably wear a seatbelt, and you can safely maneuver your car and apply brakes.  FOLLOW-UP You should see your doctor in the office for a follow-up appointment approximately 2-3 weeks after your surgery.  You should have been given your post-op/follow-up appointment when your surgery was scheduled.  If you did not receive a post-op/follow-up appointment, make sure   that you call for this appointment within a day or two after you arrive home to insure a convenient appointment time.  OTHER INSTRUCTIONS  WHEN TO CALL YOUR DOCTOR: Fever over 101.0 Inability to urinate Continued bleeding from incision. Increased pain, redness, or  drainage from the incision. Increasing abdominal pain  The clinic staff is available to answer your questions during regular business hours.  Please don't hesitate to call and ask to speak to one of the nurses for clinical concerns.  If you have a medical emergency, go to the nearest emergency room or call 911.  A surgeon from [facility] is always on call at the hospital. [address], [address], [place], [place]  [address] ? [address] [address], [place], [place]   [address] [phone] ? [phone] ? FAX [phone]   

## 2021-11-26 NOTE — Progress Notes (Signed)
Cholecystitis  Subjective: No changes overnight  Objective: Vital signs in last 24 hours: Temp:  [98.3 F (36.8 C)-98.7 F (37.1 C)] 98.4 F (36.9 C) (09/28 0527) Pulse Rate:  [54-69] 62 (09/28 0527) Resp:  [12-20] 18 (09/27 2210) BP: (107-142)/(56-78) 117/62 (09/28 0527) SpO2:  [94 %-100 %] 98 % (09/28 0527) Weight:  [106 kg] 106 kg (09/27 1147) Last BM Date : 11/24/21  Intake/Output from previous day: 09/27 0701 - 09/28 0700 In: 453.6 [I.V.:353.6; IV Piggyback:100] Out: 0  Intake/Output this shift: No intake/output data recorded.  General appearance: alert and cooperative GI: normal findings: TTP RUQ  Lab Results:  Results for orders placed or performed during the hospital encounter of 11/25/21 (from the past 24 hour(s))  CBC with Differential     Status: None   Collection Time: 11/25/21  1:23 PM  Result Value Ref Range   WBC 9.4 4.0 - 10.5 K/uL   RBC 4.51 3.87 - 5.11 MIL/uL   Hemoglobin 13.6 12.0 - 15.0 g/dL   HCT 41.1 36.0 - 46.0 %   MCV 91.1 80.0 - 100.0 fL   MCH 30.2 26.0 - 34.0 pg   MCHC 33.1 30.0 - 36.0 g/dL   RDW 13.4 11.5 - 15.5 %   Platelets 253 150 - 400 K/uL   nRBC 0.0 0.0 - 0.2 %   Neutrophils Relative % 79 %   Neutro Abs 7.4 1.7 - 7.7 K/uL   Lymphocytes Relative 16 %   Lymphs Abs 1.5 0.7 - 4.0 K/uL   Monocytes Relative 5 %   Monocytes Absolute 0.5 0.1 - 1.0 K/uL   Eosinophils Relative 0 %   Eosinophils Absolute 0.0 0.0 - 0.5 K/uL   Basophils Relative 0 %   Basophils Absolute 0.0 0.0 - 0.1 K/uL   Immature Granulocytes 0 %   Abs Immature Granulocytes 0.02 0.00 - 0.07 K/uL  Comprehensive metabolic panel     Status: Abnormal   Collection Time: 11/25/21  1:23 PM  Result Value Ref Range   Sodium 138 135 - 145 mmol/L   Potassium 4.5 3.5 - 5.1 mmol/L   Chloride 107 98 - 111 mmol/L   CO2 26 22 - 32 mmol/L   Glucose, Bld 102 (H) 70 - 99 mg/dL   BUN 15 6 - 20 mg/dL   Creatinine, Ser 0.87 0.44 - 1.00 mg/dL   Calcium 9.5 8.9 - 10.3 mg/dL   Total  Protein 7.6 6.5 - 8.1 g/dL   Albumin 4.4 3.5 - 5.0 g/dL   AST 14 (L) 15 - 41 U/L   ALT 15 0 - 44 U/L   Alkaline Phosphatase 45 38 - 126 U/L   Total Bilirubin 0.7 0.3 - 1.2 mg/dL   GFR, Estimated >60 >60 mL/min   Anion gap 5 5 - 15  Lipase, blood     Status: None   Collection Time: 11/25/21  1:23 PM  Result Value Ref Range   Lipase 27 11 - 51 U/L  Urinalysis, Routine w reflex microscopic Urine, Clean Catch     Status: Abnormal   Collection Time: 11/25/21  1:24 PM  Result Value Ref Range   Color, Urine YELLOW YELLOW   APPearance CLEAR CLEAR   Specific Gravity, Urine 1.021 1.005 - 1.030   pH 5.0 5.0 - 8.0   Glucose, UA NEGATIVE NEGATIVE mg/dL   Hgb urine dipstick SMALL (A) NEGATIVE   Bilirubin Urine NEGATIVE NEGATIVE   Ketones, ur NEGATIVE NEGATIVE mg/dL   Protein, ur NEGATIVE NEGATIVE mg/dL  Nitrite NEGATIVE NEGATIVE   Leukocytes,Ua NEGATIVE NEGATIVE   RBC / HPF 0-5 0 - 5 RBC/hpf   WBC, UA 0-5 0 - 5 WBC/hpf   Bacteria, UA NONE SEEN NONE SEEN   Squamous Epithelial / LPF 0-5 0 - 5   Mucus PRESENT    Hyaline Casts, UA PRESENT   POC beta hCG blood, ED     Status: None   Collection Time: 11/25/21  1:32 PM  Result Value Ref Range   I-stat hCG, quantitative <5.0 <5 mIU/mL   Comment 3          Surgical pcr screen     Status: None   Collection Time: 11/25/21 10:54 PM   Specimen: Nasal Mucosa; Nasal Swab  Result Value Ref Range   MRSA, PCR NEGATIVE NEGATIVE   Staphylococcus aureus NEGATIVE NEGATIVE     Studies/Results Radiology     MEDS, Scheduled  enoxaparin (LOVENOX) injection  40 mg Subcutaneous Q24H   pantoprazole  40 mg Oral Daily     Assessment: Cholecystitis   Plan: OR today for lap cholecystectomy.  Risks and benefits reviewed again with pt.  All questions answered  We discussed the risks and benefits of a laparoscopic cholecystectomy and possible cholangiogram including, but not limited to bleeding, infection, injury to surrounding structures such as the  intestine or liver, bile leak, retained gallstones, need to convert to an open procedure, prolonged diarrhea, blood clots such as  DVT, common bile duct injury, anesthesia risks, and possible need for additional procedures.  The likelihood of improvement in symptoms and return to the patient's normal status is good. We discussed the typical post-operative recovery course.  LOS: 0 days    Rosario Adie, MD [facility]  Patient's medical decision making was straightforward (25 mins met or exceeded with patient care and documentation).   11/26/2021 8:29 AM

## 2021-11-26 NOTE — Anesthesia Procedure Notes (Signed)
Procedure Name: Intubation Date/Time: 11/26/2021 11:53 AM  Performed by: [place] Manus Rudd, CRNAPre-anesthesia Checklist: Patient identified, Emergency Drugs available, Suction available and Patient being monitored Patient Re-evaluated:Patient Re-evaluated prior to induction Oxygen Delivery Method: Circle system utilized Preoxygenation: Pre-oxygenation with 100% oxygen Induction Type: IV induction Ventilation: Mask ventilation without difficulty Laryngoscope Size: Mac and 3 Grade View: Grade I Tube type: Oral Tube size: 7.0 mm Number of attempts: 1 Airway Equipment and Method: Stylet and Oral airway Placement Confirmation: ETT inserted through vocal cords under direct vision, positive ETCO2 and breath sounds checked- equal and bilateral Secured at: 20 cm Tube secured with: Tape Dental Injury: Teeth and Oropharynx as per pre-operative assessment

## 2021-11-26 NOTE — Transfer of Care (Signed)
Immediate Anesthesia Transfer of Care Note  Patient: Alexis Jones  Procedure(s) Performed: LAPAROSCOPIC CHOLECYSTECTOMY  Patient Location: PACU  Anesthesia Type:General  Level of Consciousness: awake, alert  and oriented  Airway & Oxygen Therapy: Patient Spontanous Breathing and Patient connected to face mask oxygen  Post-op Assessment: Report given to RN and Post -op Vital signs reviewed and stable  Post vital signs: Reviewed and stable  Last Vitals:  Vitals Value Taken Time  BP 137/112 11/26/21 1315  Temp 36.8 C 11/26/21 1315  Pulse 98 11/26/21 1319  Resp 15 11/26/21 1319  SpO2 89 % 11/26/21 1319  Vitals shown include unvalidated device data.  Last Pain:  Vitals:   11/26/21 1315  TempSrc:   PainSc: 3          Complications: No notable events documented.

## 2021-11-26 NOTE — Discharge Summary (Signed)
Physician Discharge Summary  Patient ID: Alexis Jones MRN: [id number] DOB/AGE: [birth year] 43 y.o.  Admit date: 11/25/2021 Discharge date: 11/26/2021  Admission Diagnoses: Cholecystitis   Discharge Diagnoses:  Principal Problem:   Cholecystitis   Discharged Condition: good  Hospital Course: Patient admitted for cholecystitis.  She underwent lap cholecystectomy.  After surgery, she was tolerating a solid diet and pain was controlled with oral medications.  She was urinating without difficulty and ambulating without assistance.  Patient was felt to be in stable condition for discharge to home.   Consults: None  Significant Diagnostic Studies: labs: cbc, cmet, lipase and radiology: CT scan: cholecystitis, RUQ [name]: cholecystitis   Treatments: antibiotics: ceftriaxone and metronidazole and surgery: lap chole  Discharge Exam: Blood pressure 132/71, pulse 67, temperature (!) 97.5 F (36.4 C), temperature source Oral, resp. rate 16, height '5\' 6"'$  (1.676 m), weight 108.9 kg, SpO2 98 %. General appearance: alert and cooperative GI: soft, appropriately tender Incision/Wound: no active drainage  Disposition: Home   Allergies as of 11/26/2021   No Known Allergies      Medication List     STOP taking these medications    buPROPion 150 MG 12 hr tablet Commonly known as: WELLBUTRIN SR   Vitamin D (Ergocalciferol) 1.25 MG (50000 UNIT) Caps capsule Commonly known as: DRISDOL       TAKE these medications    acetaminophen 325 MG tablet Commonly known as: TYLENOL Take 325-650 mg by mouth every 6 (six) hours as needed for headache or mild pain.   docusate sodium 100 MG capsule Commonly known as: Colace Take 1 capsule (100 mg total) by mouth 2 (two) times daily.   escitalopram 10 MG tablet Commonly known as: LEXAPRO Take 1 tablet (10 mg total) by mouth daily. What changed: when to take this   ibuprofen 200 MG tablet Commonly known as: ADVIL Take 200-400 mg by mouth  every 8 (eight) hours as needed for headache or mild pain.   levonorgestrel 20 MCG/24HR IUD Commonly known as: MIRENA 1 each by Intrauterine route once. Placed 05/05/15   omeprazole 40 MG capsule Commonly known as: PRILOSEC TAKE 1 CAPSULE BY MOUTH EVERY DAY What changed:  how much to take when to take this   oxyCODONE 5 MG immediate release tablet Commonly known as: Oxy IR/ROXICODONE Take 1-2 tablets (5-10 mg total) by mouth every 6 (six) hours as needed for moderate pain or severe pain ('5mg'$  moderate, '10mg'$  severe).   WOMENS MULTI VITAMIN & MINERAL PO Take 1 tablet by mouth daily with breakfast.        Follow-up Information     [facility], [place]. Go on 12/17/2021.   Specialty: General Surgery Why: Your appointment is 12/17/21 at 1:30 pm, Arrive 50mn early to check in, fill out paperwork, BEngineer, civil (consulting)ID and insurance information [address] information: [phone]               Signed: ARosario Adie90/25/8527 3:30 PM

## 2021-11-26 NOTE — Anesthesia Preprocedure Evaluation (Addendum)
Anesthesia Evaluation  Patient identified by MRN, date of birth, ID band Patient awake    Reviewed: Allergy & Precautions, NPO status , Patient's Chart, lab work & pertinent test results  History of Anesthesia Complications Negative for: history of anesthetic complications  Airway Mallampati: II  TM Distance: >3 FB Neck ROM: Full    Dental  (+) Dental Advisory Given, Teeth Intact   Pulmonary asthma ,    Pulmonary exam normal        Cardiovascular negative cardio ROS Normal cardiovascular exam     Neuro/Psych PSYCHIATRIC DISORDERS Anxiety Depression negative neurological ROS     GI/Hepatic Neg liver ROS, hiatal hernia, GERD  Controlled and Medicated,  Endo/Other   Obesity Pre-DM   Renal/GU negative Renal ROS  Female GU complaint     Musculoskeletal negative musculoskeletal ROS (+)   Abdominal   Peds  Hematology negative hematology ROS (+)   Anesthesia Other Findings   Reproductive/Obstetrics  PCOS                             Anesthesia Physical Anesthesia Plan  ASA: 2  Anesthesia Plan: General   Post-op Pain Management: Celebrex PO (pre-op)* and Tylenol PO (pre-op)*   Induction: Intravenous  PONV Risk Score and Plan: 3 and Treatment may vary due to age or medical condition, Ondansetron, Dexamethasone, Midazolam and Scopolamine patch - Pre-op  Airway Management Planned: Oral ETT  Additional Equipment: None  Intra-op Plan:   Post-operative Plan: Extubation in OR  Informed Consent: I have reviewed the patients History and Physical, chart, labs and discussed the procedure including the risks, benefits and alternatives for the proposed anesthesia with the patient or authorized representative who has indicated his/her understanding and acceptance.     Dental advisory given  Plan Discussed with: CRNA and Anesthesiologist  Anesthesia Plan Comments:         Anesthesia Quick Evaluation

## 2021-11-26 NOTE — Op Note (Signed)
11/26/2021  1:06 PM  PATIENT:  Alexis Jones  41 y.o. female  Patient Care Team: Megan Salon, MD as PCP - General (Obstetrics and Gynecology)  PRE-OPERATIVE DIAGNOSIS:  acute cholecystitis  POST-OPERATIVE DIAGNOSIS:  acute cholecystitis  PROCEDURE:  LAPAROSCOPIC CHOLECYSTECTOMY with INTRAOPERATIVE ICG EVALUATION OF BILIARY SYSTEM    Surgeon(s): Leighton Ruff, MD  ASSISTANT: Margie Billet, PA  ANESTHESIA:   local and general  EBL: 23m  Total I/O In: 1000 [I.V.:1000] Out: 0   DRAINS: none   SPECIMEN:  Source of Specimen:  gallbladder  DISPOSITION OF SPECIMEN:  PATHOLOGY  COUNTS:  YES  PLAN OF CARE: Discharge to home after PACU  PATIENT DISPOSITION:  PACU - hemodynamically stable.  INDICATION:  The anatomy & physiology of hepatobiliary & pancreatic function was discussed.  The pathophysiology of gallbladder dysfunction was discussed.  Natural history risks without surgery was discussed.   I feel the risks of no intervention will lead to serious problems that outweigh the operative risks; therefore, I recommended cholecystectomy to remove the pathology.  I explained laparoscopic techniques with possible need for an open approach.  Probable cholangiogram to evaluate the bilary tract was explained as well.    Risks such as bleeding, infection, abscess, leak, injury to other organs, need for further treatment, heart attack, death, and other risks were discussed.  I noted a good likelihood this will help address the problem.  Possibility that this will not correct all abdominal symptoms was explained.  Goals of post-operative recovery were discussed as well.    OR FINDINGS: cholecystitis   DESCRIPTION:   The patient was identified & brought into the operating room. The patient was positioned supine with arms tucked. SCDs were active during the entire case. The patient underwent general anesthesia without any difficulty.  The abdomen was prepped and draped in a  sterile fashion. A Surgical Timeout was performed and confirmed our plan.  IV ICG was given to the patient ~30 min prior to surgery.    We positioned the patient in reverse [place] & right side up.  I placed a Hassan laparoscopic port through the umbilicus using open entry technique.  Entry was clean. There were no adhesions to the anterior abdominal wall supraumbilically.  We induced carbon dioxide insufflation. Camera inspection revealed no injury.   I proceeded to continue with laparoscopic technique. I placed a 5 mm port in mid subcostal region, another 572mport in the right flank near the anterior axillary line, and a 41m28mort in the left subxiphoid region obliquely within the falciform ligament.  I turned attention to the right upper quadrant.  The gallbladder fundus was elevated cephalad. I used cautery and blunt dissection to free the peritoneal coverings between the gallbladder and the liver on the posteriolateral and anteriomedial walls.   I used careful blunt and cautery dissection with a maryland dissector to help get a good critical view of the cystic artery and cystic duct. I did further dissection to free a few centimeters of the  gallbladder off the liver bed to get a good critical view of the infundibulum and cystic duct. I mobilized the cystic artery.  I skeletonized the cystic duct.  We then used the ICG setting on the camera to identify the common bile duct and cystic duct.  The patient also had a large cystic artery.    I placed clips on the cystic duct x3. I completed cystic duct transection.   I placed clips on the cystic artery x3 with 2  proximally.  I ligated the cystic artery using scissors. I freed the gallbladder from its remaining attachments to the liver. I ensured hemostasis on the gallbladder fossa of the liver and elsewhere. I inspected the rest of the abdomen & detected no injury nor bleeding elsewhere. I irrigated the RUQ with normal saline.  I removed the gallbladder  through the umbilical port site.  I closed the umbilical fascia using 0 Vicryl stitches x3.   I closed the skin using 4-0 vicryl stitch.  Sterile dressings were applied. The patient was extubated & arrived in the PACU in stable condition.  I had discussed postoperative care with the patient in the holding area.   I will discuss  operative findings and postoperative goals / instructions with the patient's family.  Instructions are written in the chart as well.   Rosario Adie, MD  [facility]

## 2021-11-26 NOTE — TOC Progression Note (Signed)
Transition of Care [facility]) - Progression Note    Patient Details  Name: Kylee Umana MRN: [id number] Date of Birth: 21-Jun-1980  Transition of Care [facility]) CM/SW Contact  Servando Snare, [place] Phone Number: 11/26/2021, 8:47 AM  Clinical Narrative:    Transition of Care (TOC) Screening Note   Patient Details  Name: Jandy Brackens Date of Birth: 01-21-1981   Transition of Care [facility]) CM/SW Contact:    Servando Snare, LCSW Phone Number: 11/26/2021, 8:47 AM    Transition of Care Department [facility]) has reviewed patient and no TOC needs have been identified at this time. We will continue to monitor patient advancement through interdisciplinary progression rounds. If new patient transition needs arise, please place a TOC consult.          Expected Discharge Plan and Services                                                 Social Determinants of Health (SDOH) Interventions    Readmission Risk Interventions     No data to display

## 2021-11-26 NOTE — Progress Notes (Signed)
Patient was given discharge instructions, and all questions were answered.  Patient was stable for discharge and was taken to the main exit by wheelchair. 

## 2021-11-26 NOTE — Anesthesia Postprocedure Evaluation (Signed)
Anesthesia Post Note  Patient: Alexis Jones  Procedure(s) Performed: LAPAROSCOPIC CHOLECYSTECTOMY     Patient location during evaluation: PACU Anesthesia Type: General Level of consciousness: awake and alert Pain management: pain level controlled Vital Signs Assessment: post-procedure vital signs reviewed and stable Respiratory status: spontaneous breathing, nonlabored ventilation, respiratory function stable and patient connected to nasal cannula oxygen Cardiovascular status: blood pressure returned to baseline and stable Postop Assessment: no apparent nausea or vomiting Anesthetic complications: no   No notable events documented.  Last Vitals:  Vitals:   11/26/21 1400 11/26/21 1424  BP: 122/68 132/71  Pulse: 84 67  Resp: 11 16  Temp: 36.9 C (!) 36.4 C  SpO2: 100% 98%    Last Pain:  Vitals:   11/26/21 1424  TempSrc: Oral  PainSc: [place]

## 2021-11-26 NOTE — Progress Notes (Signed)
Mobility Specialist Cancellation/Refusal Note:    11/26/21 1113  Mobility  Activity Refused mobility    Reason for Cancellation/Refusal: Pt declined mobility at this time. Pt in procedure. Will check back as schedule permits.      [facility]

## 2021-11-26 NOTE — Anesthesia Postprocedure Evaluation (Deleted)
Anesthesia Post Note  Patient: Alexis Jones  Procedure(s) Performed: LAPAROSCOPIC CHOLECYSTECTOMY     Anesthesia Post Evaluation No notable events documented.  Last Vitals:  Vitals:   11/26/21 1041 11/26/21 1315  BP: 118/68 (!) 137/112  Pulse: 61 95  Resp: 16 16  Temp: 37 C 36.8 C  SpO2: 97% 100%    Last Pain:  Vitals:   11/26/21 1315  TempSrc:   PainSc: 3                  Natahsa Marian C [place]

## 2021-11-27 ENCOUNTER — Encounter (HOSPITAL_COMMUNITY): Payer: Self-pay | Admitting: General Surgery

## 2021-11-27 LAB — SURGICAL PATHOLOGY

## 2022-01-03 ENCOUNTER — Encounter (HOSPITAL_BASED_OUTPATIENT_CLINIC_OR_DEPARTMENT_OTHER): Payer: Self-pay | Admitting: Obstetrics & Gynecology

## 2022-01-04 ENCOUNTER — Other Ambulatory Visit (HOSPITAL_BASED_OUTPATIENT_CLINIC_OR_DEPARTMENT_OTHER): Payer: Self-pay | Admitting: *Deleted

## 2022-01-04 DIAGNOSIS — F3289 Other specified depressive episodes: Secondary | ICD-10-CM

## 2022-01-04 MED ORDER — ESCITALOPRAM OXALATE 10 MG PO TABS: 10.0000 mg | ORAL_TABLET | Freq: Every day | ORAL | 0 refills | Status: DC

## 2022-01-04 NOTE — Progress Notes (Signed)
Pt requests refill on lexapro, has appt in December. Refill sent.

## 2022-01-07 ENCOUNTER — Encounter (HOSPITAL_BASED_OUTPATIENT_CLINIC_OR_DEPARTMENT_OTHER): Payer: Self-pay | Admitting: Obstetrics & Gynecology

## 2022-02-25 ENCOUNTER — Other Ambulatory Visit (HOSPITAL_COMMUNITY)
Admission: RE | Admit: 2022-02-25 | Discharge: 2022-02-25 | Disposition: A | Discharge status: Home / Self Care | Payer: BC Managed Care – PPO | Source: Ambulatory Visit | Attending: Obstetrics & Gynecology | Admitting: Obstetrics & Gynecology

## 2022-02-25 ENCOUNTER — Encounter (HOSPITAL_BASED_OUTPATIENT_CLINIC_OR_DEPARTMENT_OTHER): Payer: Self-pay | Admitting: Obstetrics & Gynecology

## 2022-02-25 ENCOUNTER — Ambulatory Visit (INDEPENDENT_AMBULATORY_CARE_PROVIDER_SITE_OTHER): Payer: BC Managed Care – PPO | Admitting: Obstetrics & Gynecology

## 2022-02-25 VITALS — BP 117/75 | HR 74 | Ht 66.0 in | Wt 239.8 lb

## 2022-02-25 DIAGNOSIS — Z01419 Encounter for gynecological examination (general) (routine) without abnormal findings: Secondary | ICD-10-CM

## 2022-02-25 DIAGNOSIS — F32A Depression, unspecified: Secondary | ICD-10-CM

## 2022-02-25 DIAGNOSIS — Z124 Encounter for screening for malignant neoplasm of cervix: Secondary | ICD-10-CM | POA: Diagnosis present

## 2022-02-25 DIAGNOSIS — Z6838 Body mass index (BMI) 38.0-38.9, adult: Secondary | ICD-10-CM | POA: Diagnosis not present

## 2022-02-25 DIAGNOSIS — F3289 Other specified depressive episodes: Secondary | ICD-10-CM

## 2022-02-25 DIAGNOSIS — N8110 Cystocele, unspecified: Secondary | ICD-10-CM

## 2022-02-25 DIAGNOSIS — T8332XD Displacement of intrauterine contraceptive device, subsequent encounter: Secondary | ICD-10-CM | POA: Diagnosis not present

## 2022-02-25 NOTE — Progress Notes (Signed)
41 y.o. [id number] Divorced White or Caucasian female here for annual exam.  IUD placed 05/05/2015.  Has started having some spotting.  Would like removed and replaced if possible today.  Has reconnected with a good friend from college.    Had to have cholecystectomy done this fall.  Had acute pain.  Went to urgent care and then ended up going to the ER.  Had surgery on 9/28 with Dr. Marcello Moores.    Sister just had colonoscopy and had had polyps removed.  She was 48.  Family encouraged to screen earlier.    Has lost 22 pounds since July.  Stopped going to [employer].  Is doing more exercise.     No LMP recorded. (Menstrual status: IUD).          Sexually active:  No  The current method of family planning is IUD.     Upstream - 02/25/22 1338       Pregnancy Intention Screening   Does the patient want to become pregnant in the next year? No    Would the patient like to discuss contraceptive options today? No      Contraception Wrap Up   Current Method IUD or IUS           Smoker:  no  Health Maintenance: Pap:  04/2019 History of abnormal Pap:  no MMG:  10/2021 Colonoscopy:  guidelines reviewed Screening Labs: will draw today   reports that she has never smoked. She has never used smokeless tobacco. She reports that she does not drink alcohol and does not use drugs.  Past Medical History:  Diagnosis Date   Anxiety    Asthma    Dyspnea    Female bladder prolapse    GERD (gastroesophageal reflux disease)    High triglycerides    HLD (hyperlipidemia)    Left ovarian cyst    Low HDL (under 40)    Lower extremity edema    Microcytic anemia    PCOS (polycystic ovarian syndrome)    Prediabetes    Rectocele    Urinary incontinence    Vitamin D deficiency     Past Surgical History:  Procedure Laterality Date   BREAST REDUCTION SURGERY  2000   CHOLECYSTECTOMY N/A 11/26/2021   Procedure: LAPAROSCOPIC CHOLECYSTECTOMY;  Surgeon: Leighton Ruff, MD;  Location: [facility];   Service: General;  Laterality: N/A;   LIPOMA EXCISION  2015   left shoulder lipoma removed (2)   REDUCTION MAMMAPLASTY     TEAR DUCT PROBING  04/1979    Current Outpatient Medications  Medication Sig Dispense Refill   acetaminophen (TYLENOL) 325 MG tablet Take 325-650 mg by mouth every 6 (six) hours as needed for headache or mild pain.     CALCIUM PO Take 1,000 mg by mouth daily.     escitalopram (LEXAPRO) 10 MG tablet Take 1 tablet (10 mg total) by mouth daily. 90 tablet 0   ibuprofen (ADVIL,MOTRIN) 200 MG tablet Take 200-400 mg by mouth every 8 (eight) hours as needed for headache or mild pain.     levonorgestrel (MIRENA) 20 MCG/24HR IUD 1 each by Intrauterine route once. Placed 05/05/15     Multiple Vitamins-Minerals (WOMENS MULTI VITAMIN & MINERAL PO) Take 1 tablet by mouth daily with breakfast.     omeprazole (PRILOSEC) 40 MG capsule TAKE 1 CAPSULE BY MOUTH EVERY DAY (Patient taking differently: Take 40 mg by mouth at bedtime.) 90 capsule 1   spironolactone (ALDACTONE) 100 MG tablet Take 100  mg by mouth daily.     No current facility-administered medications for this visit.    Family History  Problem Relation Age of Onset   Hypertension Mother    Hyperlipidemia Mother    Thyroid disease Mother    Anxiety disorder Mother    Obesity Mother    Cancer Father        prostate cancer   Mitral valve prolapse Father    Obesity Father    Breast cancer Maternal Aunt    Ovarian cancer Maternal Grandmother    Skin cancer Maternal Grandfather    Lung cancer Maternal Grandfather     ROS: Constitutional: negative Genitourinary:negative  Exam:   BP 117/75   Pulse 74   Ht '5\' 6"'$  (1.676 m)   Wt 239 lb 12.8 oz (108.8 kg)   BMI 38.70 kg/m   Height: '5\' 6"'$  (167.6 cm)  General appearance: alert, cooperative and appears stated age Head: Normocephalic, without obvious abnormality, atraumatic Neck: no adenopathy, supple, symmetrical, trachea midline and thyroid normal to inspection and  palpation Lungs: clear to auscultation bilaterally Breasts: normal appearance, no masses or tenderness Heart: regular rate and rhythm Abdomen: soft, non-tender; bowel sounds normal; no masses,  no organomegaly Extremities: extremities normal, atraumatic, no cyanosis or edema Skin: Skin color, texture, turgor normal. No rashes or lesions Lymph nodes: Cervical, supraclavicular, and axillary nodes normal. No abnormal inguinal nodes palpated Neurologic: Grossly normal   Pelvic: External genitalia:  no lesions              Urethra:  normal appearing urethra with no masses, tenderness or lesions              Bartholins and Skenes: normal                 Vagina: normal appearing vagina with normal color and no discharge, no lesions              Cervix: no lesions, no strings identified              Pap taken: Yes.   Bimanual Exam:  Uterus:  normal size, contour, position, consistency, mobility, non-tender              Adnexa: normal adnexa and no mass, fullness, tenderness               Rectovaginal: Confirms               Anus:  normal sphincter tone, no lesions  Chaperone, Octaviano Batty, CMA, was present for exam.  Assessment/Plan: 1. Well woman exam with routine gynecological exam - Pap smear 04/2019.  Obtained today.  - Mammogram 10/2021 - Colonoscopy guidelines reviewed - lab work ordered today - vaccines reviewed/updated  2. Cervical cancer screening - Cytology - PAP( [place])  3. BMI 38.0-38.9,adult - Comprehensive metabolic panel - Lipid panel - VITAMIN D 25 Hydroxy (Vit-D Deficiency, Fractures)  4. Intrauterine contraceptive device threads lost, subsequent encounter - US PELVIS TRANSVAGINAL NON-OB (TV ONLY); Future - pt will return for ultrasound guided removal and replacement of new IUD due to spotting that has started  5. Female bladder prolapse - mildly improved on exam today  6. Other depression - escitalopram (LEXAPRO) 10 MG tablet; Take 1 tablet (10 mg total)  by mouth daily.  Dispense: 90 tablet; Refill: 3

## 2022-02-26 ENCOUNTER — Other Ambulatory Visit: Payer: Self-pay | Admitting: Obstetrics & Gynecology

## 2022-02-26 DIAGNOSIS — K219 Gastro-esophageal reflux disease without esophagitis: Secondary | ICD-10-CM

## 2022-02-26 LAB — COMPREHENSIVE METABOLIC PANEL
ALT: 19 IU/L (ref 0–32)
AST: 13 IU/L (ref 0–40)
Albumin/Globulin Ratio: 1.9 (ref 1.2–2.2)
Albumin: 4.4 g/dL (ref 3.9–4.9)
Alkaline Phosphatase: 72 IU/L (ref 44–121)
BUN/Creatinine Ratio: 10 (ref 9–23)
BUN: 8 mg/dL (ref 6–24)
Bilirubin Total: 0.3 mg/dL (ref 0.0–1.2)
CO2: 22 mmol/L (ref 20–29)
Calcium: 9.2 mg/dL (ref 8.7–10.2)
Chloride: 102 mmol/L (ref 96–106)
Creatinine, Ser: 0.83 mg/dL (ref 0.57–1.00)
Globulin, Total: 2.3 g/dL (ref 1.5–4.5)
Glucose: 85 mg/dL (ref 70–99)
Potassium: 4.2 mmol/L (ref 3.5–5.2)
Sodium: 137 mmol/L (ref 134–144)
Total Protein: 6.7 g/dL (ref 6.0–8.5)
eGFR: 90 mL/min/{1.73_m2} (ref >59)

## 2022-02-26 LAB — LIPID PANEL
Chol/HDL Ratio: 5.8 ratio — ABNORMAL HIGH (ref 0.0–4.4)
Cholesterol, Total: 193 mg/dL (ref 100–199)
HDL: 33 mg/dL — ABNORMAL LOW
LDL Chol Calc (NIH): 115 mg/dL — ABNORMAL HIGH (ref 0–99)
Triglycerides: 260 mg/dL — ABNORMAL HIGH (ref 0–149)
VLDL Cholesterol Cal: 45 mg/dL — ABNORMAL HIGH (ref 5–40)

## 2022-02-26 LAB — VITAMIN D 25 HYDROXY (VIT D DEFICIENCY, FRACTURES): Vit D, 25-Hydroxy: 29 ng/mL — ABNORMAL LOW (ref 30.0–100.0)

## 2022-02-28 MED ORDER — ESCITALOPRAM OXALATE 10 MG PO TABS: 10.0000 mg | ORAL_TABLET | Freq: Every day | ORAL | 3 refills | Status: DC

## 2022-03-02 LAB — CYTOLOGY - PAP
Adequacy: ABSENT
Comment: NEGATIVE
Diagnosis: NEGATIVE
High risk HPV: NEGATIVE

## 2022-03-17 ENCOUNTER — Ambulatory Visit (HOSPITAL_BASED_OUTPATIENT_CLINIC_OR_DEPARTMENT_OTHER): Payer: BC Managed Care – PPO | Admitting: Obstetrics & Gynecology

## 2022-03-17 ENCOUNTER — Other Ambulatory Visit (HOSPITAL_BASED_OUTPATIENT_CLINIC_OR_DEPARTMENT_OTHER): Payer: Self-pay | Admitting: Obstetrics & Gynecology

## 2022-03-17 ENCOUNTER — Ambulatory Visit (INDEPENDENT_AMBULATORY_CARE_PROVIDER_SITE_OTHER): Payer: BC Managed Care – PPO

## 2022-03-17 DIAGNOSIS — T8332XD Displacement of intrauterine contraceptive device, subsequent encounter: Secondary | ICD-10-CM

## 2022-03-17 DIAGNOSIS — Z30433 Encounter for removal and reinsertion of intrauterine contraceptive device: Secondary | ICD-10-CM | POA: Diagnosis not present

## 2022-03-17 DIAGNOSIS — F32A Depression, unspecified: Secondary | ICD-10-CM

## 2022-03-17 DIAGNOSIS — T8332XA Displacement of intrauterine contraceptive device, initial encounter: Secondary | ICD-10-CM | POA: Diagnosis not present

## 2022-03-17 DIAGNOSIS — F3289 Other specified depressive episodes: Secondary | ICD-10-CM

## 2022-03-17 DIAGNOSIS — Z01419 Encounter for gynecological examination (general) (routine) without abnormal findings: Secondary | ICD-10-CM

## 2022-03-17 DIAGNOSIS — Z124 Encounter for screening for malignant neoplasm of cervix: Secondary | ICD-10-CM

## 2022-03-17 DIAGNOSIS — Z6838 Body mass index (BMI) 38.0-38.9, adult: Secondary | ICD-10-CM

## 2022-03-17 DIAGNOSIS — Z30430 Encounter for insertion of intrauterine contraceptive device: Secondary | ICD-10-CM

## 2022-03-17 DIAGNOSIS — N8110 Cystocele, unspecified: Secondary | ICD-10-CM

## 2022-03-17 MED ORDER — LEVONORGESTREL 20 MCG/DAY IU IUD
1.0000 | INTRAUTERINE_SYSTEM | Freq: Once | INTRAUTERINE | Status: AC
  Administered 2022-03-17: 1 via INTRAUTERINE

## 2022-03-20 ENCOUNTER — Encounter (HOSPITAL_BASED_OUTPATIENT_CLINIC_OR_DEPARTMENT_OTHER): Payer: Self-pay | Admitting: Obstetrics & Gynecology

## 2022-03-20 NOTE — Progress Notes (Signed)
42 y.o. G73P2012 Divorced Caucasian female presents for removal of Mirena IUD under ultrasound guidance and then re-insertion of IUD is planned.  Pt has non-visualized IUD strings.  She is not currently sexually active but considering with person in her life.  She has been counseled about risks and benefits as well as complications.  Consent is obtained today.  All questions answered prior to start of procedure.   Recent STD testing:  no.  Pt has no concerns LMP:  No LMP recorded. (Menstrual status: IUD).  Patient Active Problem List   Diagnosis Date Noted   Cholecystitis 11/25/2021   Depression 11/21/2019   Class 2 severe obesity with serious comorbidity and body mass index (BMI) of 36.0 to 36.9 in adult [facility]) 08/15/2018   Mixed incontinence 05/18/2018   Rectocele 05/18/2018   Female bladder prolapse 05/18/2018   Left ovarian cyst 04/28/2018   Vitamin D deficiency 04/28/2018   Microcytic anemia 04/28/2018   BMI 40.0-44.9, adult ([place]) 04/28/2018   Low HDL (under 40) 04/28/2018   Prediabetes 04/28/2018   Past Medical History:  Diagnosis Date   Anxiety    Asthma    Dyspnea    Female bladder prolapse    GERD (gastroesophageal reflux disease)    High triglycerides    HLD (hyperlipidemia)    Left ovarian cyst    Low HDL (under 40)    Lower extremity edema    Microcytic anemia    PCOS (polycystic ovarian syndrome)    Prediabetes    Rectocele    Urinary incontinence    Vitamin D deficiency    Current Outpatient Medications on File Prior to Visit  Medication Sig Dispense Refill   acetaminophen (TYLENOL) 325 MG tablet Take 325-650 mg by mouth every 6 (six) hours as needed for headache or mild pain.     CALCIUM PO Take 1,000 mg by mouth daily.     escitalopram (LEXAPRO) 10 MG tablet Take 1 tablet (10 mg total) by mouth daily. 90 tablet 3   ibuprofen (ADVIL,MOTRIN) 200 MG tablet Take 200-400 mg by mouth every 8 (eight) hours as needed for headache or mild pain.     levonorgestrel  (MIRENA) 20 MCG/24HR IUD 1 each by Intrauterine route once. Placed 05/05/15     Multiple Vitamins-Minerals (WOMENS MULTI VITAMIN & MINERAL PO) Take 1 tablet by mouth daily with breakfast.     omeprazole (PRILOSEC) 40 MG capsule TAKE 1 CAPSULE BY MOUTH EVERY DAY 90 capsule 1   spironolactone (ALDACTONE) 100 MG tablet Take 100 mg by mouth daily.     No current facility-administered medications on file prior to visit.   Patient has no known allergies.  Review of Systems  Constitutional: Negative.   Genitourinary: Negative.    There were no vitals filed for this visit.  Gen:  WNWF healthy female NAD Abdomen: soft, non-tender Groin:  no inguinal nodes palpated  Pelvic exam: Vulva:  normal female genitalia Vagina:  normal vagina Cervix:  Non-tender, Negative CMT, no lesions or redness. Uterus:  normal shape, position and consistency   Procedure::  Consent obtained.  Plastic disposable speculum reinserted.  Cervix visualized and cleansed with Betadine x 3.  Paracervical block was not placed.  Single toothed tenaculum applied to anterior lip of cervix without difficulty.  Then using ultrasound guidance, a metal endometrial curette was passed into the cervical canal and then into the endometrial canal.  This curette was rotated 180 degrees to the IUD and IUD grasped and removed with one pull and  without difficulty.  IUD was removed intact.   Then uterus sounded to 8cm.  Then new IUD package was opened.  IUD and introducer passed to fundus and then withdrawn slightly before IUD was passed into endometrial cavity.  Introducer removed.  Strings cut to 2cm.  Tenaculum removed from cervix.  Minimal bleeding noted.  Pt tolerated the procedure well.  Ultrasound used to confirm correct placement with IUD arms extended in the fundal region of the endometrial canal.  All instruments removed from vagina.  Assessment/Plan 1. Encounter for IUD removal and reinsertion - Return for recheck 6-8 weeks - Pt aware  to call for any concerns - Pt aware removal due no later than 8 years from now.  IUD card given to pt.  2. Intrauterine contraceptive device threads lost, subsequent encounter

## 2022-04-26 ENCOUNTER — Ambulatory Visit (HOSPITAL_BASED_OUTPATIENT_CLINIC_OR_DEPARTMENT_OTHER): Payer: BC Managed Care – PPO | Admitting: Obstetrics & Gynecology

## 2022-04-26 ENCOUNTER — Encounter (HOSPITAL_BASED_OUTPATIENT_CLINIC_OR_DEPARTMENT_OTHER): Payer: Self-pay | Admitting: Obstetrics & Gynecology

## 2022-04-26 VITALS — BP 124/83 | HR 68 | Ht 66.0 in | Wt 236.8 lb

## 2022-04-26 DIAGNOSIS — Z30431 Encounter for routine checking of intrauterine contraceptive device: Secondary | ICD-10-CM

## 2022-04-26 NOTE — Progress Notes (Signed)
42 y.o. G71P2012 Divorced Caucasian female presents for followed up after insertion of Mirena IUD on 03/17/2022.  Pt reports she is doing well.  Had spotting for a few days afterwards.  Had a small amount of cramping.  Has not been SA yet since IUD placed.     LMP:  No LMP recorded. (Menstrual status: IUD).  Patient Active Problem List   Diagnosis Date Noted   Cholecystitis 11/25/2021   Depression 11/21/2019   Class 2 severe obesity with serious comorbidity and body mass index (BMI) of 36.0 to 36.9 in adult [facility]) 08/15/2018   Mixed incontinence 05/18/2018   Rectocele 05/18/2018   Female bladder prolapse 05/18/2018   Left ovarian cyst 04/28/2018   Vitamin D deficiency 04/28/2018   Microcytic anemia 04/28/2018   BMI 40.0-44.9, adult ([place]) 04/28/2018   Low HDL (under 40) 04/28/2018   Prediabetes 04/28/2018   Past Medical History:  Diagnosis Date   Anxiety    Asthma    Dyspnea    Female bladder prolapse    GERD (gastroesophageal reflux disease)    High triglycerides    HLD (hyperlipidemia)    Left ovarian cyst    Low HDL (under 40)    Lower extremity edema    Microcytic anemia    PCOS (polycystic ovarian syndrome)    Prediabetes    Rectocele    Urinary incontinence    Vitamin D deficiency    Current Outpatient Medications on File Prior to Visit  Medication Sig Dispense Refill   acetaminophen (TYLENOL) 325 MG tablet Take 325-650 mg by mouth every 6 (six) hours as needed for headache or mild pain.     CALCIUM PO Take 1,000 mg by mouth daily.     escitalopram (LEXAPRO) 10 MG tablet Take 1 tablet (10 mg total) by mouth daily. 90 tablet 3   ibuprofen (ADVIL,MOTRIN) 200 MG tablet Take 200-400 mg by mouth every 8 (eight) hours as needed for headache or mild pain.     levonorgestrel (MIRENA) 20 MCG/24HR IUD 1 each by Intrauterine route once. Placed 05/05/15     Multiple Vitamins-Minerals (WOMENS MULTI VITAMIN & MINERAL PO) Take 1 tablet by mouth daily with breakfast.     omeprazole  (PRILOSEC) 40 MG capsule TAKE 1 CAPSULE BY MOUTH EVERY DAY 90 capsule 1   spironolactone (ALDACTONE) 100 MG tablet Take 100 mg by mouth daily.     No current facility-administered medications on file prior to visit.   Patient has no known allergies.  Review of Systems  Constitutional: Negative.   Genitourinary: Negative.    Vitals:   04/26/22 1521  BP: 124/83  Pulse: 68  Weight: 236 lb 12.8 oz (107.4 kg)  Height: '5\' 6"'$  (1.676 m)    Gen:  WNWF healthy female NAD Abdomen: soft, non-tender Groin:  no inguinal nodes palpated  Pelvic exam: Vulva:  normal female genitalia Vagina:  normal vagina Cervix:  Non-tender, Negative CMT, no lesions or redness.  IUD string 2.5cm.   Uterus:  normal shape, position and consistency    Assessment/Plan: 1. IUD check up - pt is advised to call if she has any future concerns regarding bleeding or pain or any other concerns.

## 2022-08-27 ENCOUNTER — Other Ambulatory Visit: Payer: Self-pay | Admitting: Obstetrics & Gynecology

## 2022-08-27 DIAGNOSIS — K219 Gastro-esophageal reflux disease without esophagitis: Secondary | ICD-10-CM

## 2023-03-03 ENCOUNTER — Ambulatory Visit (HOSPITAL_BASED_OUTPATIENT_CLINIC_OR_DEPARTMENT_OTHER): Payer: BC Managed Care – PPO | Admitting: Obstetrics & Gynecology

## 2023-03-06 ENCOUNTER — Other Ambulatory Visit: Payer: Self-pay | Admitting: Obstetrics & Gynecology

## 2023-03-06 DIAGNOSIS — K219 Gastro-esophageal reflux disease without esophagitis: Secondary | ICD-10-CM

## 2023-03-30 ENCOUNTER — Other Ambulatory Visit: Payer: Self-pay | Admitting: Obstetrics & Gynecology

## 2023-03-30 ENCOUNTER — Ambulatory Visit (HOSPITAL_BASED_OUTPATIENT_CLINIC_OR_DEPARTMENT_OTHER): Payer: BC Managed Care – PPO | Admitting: Obstetrics & Gynecology

## 2023-03-30 ENCOUNTER — Encounter: Payer: Self-pay | Admitting: Obstetrics & Gynecology

## 2023-03-30 ENCOUNTER — Inpatient Hospital Stay (HOSPITAL_BASED_OUTPATIENT_CLINIC_OR_DEPARTMENT_OTHER): Admission: RE | Admit: 2023-03-30 | Payer: 59 | Source: Ambulatory Visit | Admitting: Radiology

## 2023-03-30 ENCOUNTER — Encounter (HOSPITAL_BASED_OUTPATIENT_CLINIC_OR_DEPARTMENT_OTHER): Payer: Self-pay

## 2023-03-30 DIAGNOSIS — N6489 Other specified disorders of breast: Secondary | ICD-10-CM

## 2023-03-30 DIAGNOSIS — Z1231 Encounter for screening mammogram for malignant neoplasm of breast: Secondary | ICD-10-CM

## 2023-04-01 ENCOUNTER — Other Ambulatory Visit (HOSPITAL_BASED_OUTPATIENT_CLINIC_OR_DEPARTMENT_OTHER): Payer: Self-pay | Admitting: Obstetrics & Gynecology

## 2023-04-01 DIAGNOSIS — F3289 Other specified depressive episodes: Secondary | ICD-10-CM

## 2023-04-18 ENCOUNTER — Encounter (HOSPITAL_BASED_OUTPATIENT_CLINIC_OR_DEPARTMENT_OTHER): Payer: Self-pay | Admitting: Obstetrics & Gynecology

## 2023-04-19 ENCOUNTER — Encounter (HOSPITAL_BASED_OUTPATIENT_CLINIC_OR_DEPARTMENT_OTHER): Payer: Self-pay | Admitting: Obstetrics & Gynecology

## 2023-04-19 ENCOUNTER — Ambulatory Visit (HOSPITAL_BASED_OUTPATIENT_CLINIC_OR_DEPARTMENT_OTHER): Payer: 59 | Admitting: Obstetrics & Gynecology

## 2023-04-19 VITALS — BP 128/69 | HR 79 | Ht 66.0 in | Wt 250.8 lb

## 2023-04-19 DIAGNOSIS — Z1211 Encounter for screening for malignant neoplasm of colon: Secondary | ICD-10-CM

## 2023-04-19 DIAGNOSIS — N8110 Cystocele, unspecified: Secondary | ICD-10-CM | POA: Diagnosis not present

## 2023-04-19 DIAGNOSIS — Z975 Presence of (intrauterine) contraceptive device: Secondary | ICD-10-CM | POA: Diagnosis not present

## 2023-04-19 DIAGNOSIS — Z87898 Personal history of other specified conditions: Secondary | ICD-10-CM

## 2023-04-19 DIAGNOSIS — Z01419 Encounter for gynecological examination (general) (routine) without abnormal findings: Secondary | ICD-10-CM | POA: Diagnosis not present

## 2023-04-19 DIAGNOSIS — K219 Gastro-esophageal reflux disease without esophagitis: Secondary | ICD-10-CM

## 2023-04-19 DIAGNOSIS — E782 Mixed hyperlipidemia: Secondary | ICD-10-CM

## 2023-04-19 DIAGNOSIS — F3289 Other specified depressive episodes: Secondary | ICD-10-CM

## 2023-04-19 MED ORDER — ESCITALOPRAM OXALATE 10 MG PO TABS: 10.0000 mg | ORAL_TABLET | Freq: Every day | ORAL | 3 refills | Status: AC

## 2023-04-19 MED ORDER — OMEPRAZOLE 40 MG PO CPDR: 40.0000 mg | DELAYED_RELEASE_CAPSULE | Freq: Every day | ORAL | 3 refills | Status: AC

## 2023-04-19 NOTE — Progress Notes (Signed)
ANNUAL EXAM Patient name: Alexis Jones MRN [id number]  Date of birth: [birth year] Chief Complaint:   AEX  History of Present Illness:   Annalei Friesz is a 43 y.o. G30P2012 Caucasian female being seen today for a routine annual exam.  Doing well.  Does not have any bleeding.  Has IUD that was placed 03/17/2022.    She is dating.  Has been with same person for about a year.  He lives in [place].  He is coming next week for her birthday.    H/o elevated triglycerides and mildly elevated LDLs.  Does not have PCP.  Options discussed.  As a single parent, just doesn't feel she has time for meal prep.    No LMP recorded. (Menstrual status: IUD).  Last pap 02/25/2022. Results were: NILM w/ HRHPV negative. H/O abnormal pap: no Last mammogram: 11/16/2021. Has next one scheduled later this week.  Results were: normal. Family h/o breast cancer: yes .  Maternal aunt Last colonoscopy: guidelines reviewed.      04/19/2023    1:28 PM 04/26/2022    3:22 PM 02/25/2022    1:37 PM 09/24/2020   11:55 AM 05/17/2018    9:57 AM  Depression screen PHQ 2/9  Decreased Interest 0 0 0 0 2  Down, Depressed, Hopeless 0 0 0 0 1  PHQ - 2 Score 0 0 0 0 3  Altered sleeping     1  Tired, decreased energy     2  Change in appetite     1  Feeling bad or failure about yourself      0  Trouble concentrating     0  Moving slowly or fidgety/restless     0  Suicidal thoughts     0  PHQ-9 Score     7  Difficult doing work/chores     Not difficult at all    Review of Systems:   Pertinent items are noted in HPI  Denies any headaches, blurred vision, fatigue, shortness of breath, chest pain, abdominal pain, abnormal vaginal discharge/itching/odor/irritation, problems with periods, bowel movements, urination, or intercourse unless otherwise stated above. Pertinent History Reviewed:  Reviewed past medical,surgical, social and family history.  Reviewed problem list, medications and allergies. Physical  Assessment:   Vitals:   04/19/23 1327  BP: 128/69  Pulse: 79  Weight: 250 lb 12.8 oz (113.8 kg)  Height: 5\' 6"  (1.676 m)  Body mass index is 40.48 kg/m.        Physical Examination:   General appearance - well appearing, and in no distress  Mental status - alert, oriented to person, place, and time  Psych:  She has a normal mood and affect  Skin - warm and dry, normal color, no suspicious lesions noted  Chest - effort normal, all lung fields clear to auscultation bilaterally  Heart - normal rate and regular rhythm  Neck:  midline trachea, no thyromegaly or nodules  Breasts - breasts appear normal, no suspicious masses, no skin or nipple changes or  axillary nodes  Abdomen - soft, nontender, nondistended, no masses or organomegaly  Pelvic - VULVA: normal appearing vulva with no masses, tenderness or lesions   VAGINA: normal appearing vagina with normal color and discharge, no lesions   CERVIX: normal appearing cervix without discharge or lesions, no CMT  Thin prep pap not obtained  UTERUS: uterus is felt to be normal size, shape, consistency and nontender   ADNEXA: No adnexal masses or tenderness noted.  Rectal - normal  rectal, good sphincter tone, no masses felt.  Extremities:  No swelling or varicosities noted  Chaperone present for exam  Assessment & Plan:  1. Well woman exam with routine gynecological exam ([place]) - Pap smear neg with neg HR HPV 2023.  Not indicated today. - Mammogram scheduled later this week. - Colonoscopy referral placed - lab work needs to be done.  See orders below.   - vaccines reviewed/updated  2. IUD (intrauterine device) in place  3. Colon cancer screening - Ambulatory referral to Gastroenterology  4. Female bladder prolapse  5. History of prediabetes - Hemoglobin A1c; Future - Insulin, random; Future  6. Elevated triglycerides with high cholesterol - Comprehensive metabolic panel; Future - Lipid panel; Future   Orders Placed This  Encounter  Procedures   Hemoglobin A1c   Comprehensive metabolic panel   Lipid panel   Insulin, random   Ambulatory referral to Gastroenterology    Meds:  Meds ordered this encounter  Medications   omeprazole (PRILOSEC) 40 MG capsule    Sig: Take 1 capsule (40 mg total) by mouth daily.    Dispense:  90 capsule    Refill:  3   escitalopram (LEXAPRO) 10 MG tablet    Sig: Take 1 tablet (10 mg total) by mouth daily.    Dispense:  90 tablet    Refill:  3    Follow-up: Return in about 1 year (around 04/18/2024).  Jerene Bears, MD 04/19/2023 2:00 PM

## 2023-04-19 NOTE — Patient Instructions (Signed)
Jerre Simon, NP.

## 2023-04-20 LAB — COMPREHENSIVE METABOLIC PANEL
ALT: 21 [IU]/L (ref 0–32)
AST: 15 [IU]/L (ref 0–40)
Albumin: 4.2 g/dL (ref 3.9–4.9)
Alkaline Phosphatase: 63 [IU]/L (ref 44–121)
BUN/Creatinine Ratio: 11 (ref 9–23)
BUN: 9 mg/dL (ref 6–24)
Bilirubin Total: 0.3 mg/dL (ref 0.0–1.2)
CO2: 23 mmol/L (ref 20–29)
Calcium: 9.2 mg/dL (ref 8.7–10.2)
Chloride: 102 mmol/L (ref 96–106)
Creatinine, Ser: 0.83 mg/dL (ref 0.57–1.00)
Globulin, Total: 2.6 g/dL (ref 1.5–4.5)
Glucose: 77 mg/dL (ref 70–99)
Potassium: 4.1 mmol/L (ref 3.5–5.2)
Sodium: 140 mmol/L (ref 134–144)
Total Protein: 6.8 g/dL (ref 6.0–8.5)
eGFR: 89 mL/min/{1.73_m2} (ref >59)

## 2023-04-20 LAB — LIPID PANEL
Chol/HDL Ratio: 6.7 {ratio} — ABNORMAL HIGH (ref 0.0–4.4)
Cholesterol, Total: 209 mg/dL — ABNORMAL HIGH (ref 100–199)
HDL: 31 mg/dL — ABNORMAL LOW (ref >39)
LDL Chol Calc (NIH): 131 mg/dL — ABNORMAL HIGH (ref 0–99)
Triglycerides: 263 mg/dL — ABNORMAL HIGH (ref 0–149)
VLDL Cholesterol Cal: 47 mg/dL — ABNORMAL HIGH (ref 5–40)

## 2023-04-20 LAB — HEMOGLOBIN A1C
Est. average glucose Bld gHb Est-mCnc: 111 mg/dL
Hgb A1c MFr Bld: 5.5 % (ref 4.8–5.6)

## 2023-04-20 LAB — INSULIN, RANDOM: INSULIN: 30.4 u[IU]/mL — ABNORMAL HIGH (ref 2.6–24.9)

## 2023-04-21 ENCOUNTER — Encounter (HOSPITAL_BASED_OUTPATIENT_CLINIC_OR_DEPARTMENT_OTHER): Payer: Self-pay | Admitting: Obstetrics & Gynecology

## 2023-04-25 ENCOUNTER — Encounter (HOSPITAL_BASED_OUTPATIENT_CLINIC_OR_DEPARTMENT_OTHER): Payer: Self-pay | Admitting: Obstetrics & Gynecology

## 2023-04-26 ENCOUNTER — Ambulatory Visit
Admission: RE | Admit: 2023-04-26 | Discharge: 2023-04-26 | Disposition: A | Discharge status: Home / Self Care | Payer: 59 | Source: Ambulatory Visit | Attending: Obstetrics & Gynecology | Admitting: Obstetrics & Gynecology

## 2023-04-26 ENCOUNTER — Other Ambulatory Visit (HOSPITAL_BASED_OUTPATIENT_CLINIC_OR_DEPARTMENT_OTHER): Payer: Self-pay | Admitting: Obstetrics & Gynecology

## 2023-04-26 DIAGNOSIS — N6489 Other specified disorders of breast: Secondary | ICD-10-CM

## 2023-04-26 DIAGNOSIS — E168 Other specified disorders of pancreatic internal secretion: Secondary | ICD-10-CM

## 2023-04-26 DIAGNOSIS — E785 Hyperlipidemia, unspecified: Secondary | ICD-10-CM

## 2023-05-21 ENCOUNTER — Encounter (HOSPITAL_BASED_OUTPATIENT_CLINIC_OR_DEPARTMENT_OTHER): Payer: Self-pay | Admitting: Obstetrics & Gynecology

## 2023-07-29 ENCOUNTER — Encounter (HOSPITAL_BASED_OUTPATIENT_CLINIC_OR_DEPARTMENT_OTHER): Payer: Self-pay | Admitting: Obstetrics & Gynecology

## 2023-07-29 DIAGNOSIS — E785 Hyperlipidemia, unspecified: Secondary | ICD-10-CM

## 2023-07-29 DIAGNOSIS — E168 Other specified disorders of pancreatic internal secretion: Secondary | ICD-10-CM

## 2023-08-03 ENCOUNTER — Ambulatory Visit (HOSPITAL_BASED_OUTPATIENT_CLINIC_OR_DEPARTMENT_OTHER): Payer: Self-pay | Admitting: Obstetrics & Gynecology

## 2023-08-03 ENCOUNTER — Encounter (HOSPITAL_BASED_OUTPATIENT_CLINIC_OR_DEPARTMENT_OTHER): Payer: Self-pay | Admitting: Obstetrics & Gynecology

## 2023-08-03 LAB — INSULIN, RANDOM: INSULIN: 16.7 u[IU]/mL (ref 2.6–24.9)

## 2023-08-03 LAB — LIPID PANEL
Chol/HDL Ratio: 6.7 ratio — ABNORMAL HIGH (ref 0.0–4.4)
Cholesterol, Total: 207 mg/dL — ABNORMAL HIGH (ref 100–199)
HDL: 31 mg/dL — ABNORMAL LOW (ref >39)
LDL Chol Calc (NIH): 135 mg/dL — ABNORMAL HIGH (ref 0–99)
Triglycerides: 225 mg/dL — ABNORMAL HIGH (ref 0–149)
VLDL Cholesterol Cal: 41 mg/dL — ABNORMAL HIGH (ref 5–40)

## 2023-08-04 ENCOUNTER — Encounter (HOSPITAL_BASED_OUTPATIENT_CLINIC_OR_DEPARTMENT_OTHER): Payer: Self-pay | Admitting: Family Medicine

## 2023-08-04 ENCOUNTER — Ambulatory Visit (HOSPITAL_BASED_OUTPATIENT_CLINIC_OR_DEPARTMENT_OTHER): Admitting: Family Medicine

## 2023-08-04 ENCOUNTER — Ambulatory Visit (HOSPITAL_BASED_OUTPATIENT_CLINIC_OR_DEPARTMENT_OTHER): Payer: Self-pay | Admitting: Family Medicine

## 2023-08-04 VITALS — BP 120/73 | HR 70 | Ht 66.0 in | Wt 253.0 lb

## 2023-08-04 DIAGNOSIS — Z6841 Body Mass Index (BMI) 40.0 and over, adult: Secondary | ICD-10-CM

## 2023-08-04 DIAGNOSIS — Z8639 Personal history of other endocrine, nutritional and metabolic disease: Secondary | ICD-10-CM | POA: Diagnosis not present

## 2023-08-04 DIAGNOSIS — Z1211 Encounter for screening for malignant neoplasm of colon: Secondary | ICD-10-CM | POA: Diagnosis not present

## 2023-08-04 DIAGNOSIS — E66813 Obesity, class 3: Secondary | ICD-10-CM | POA: Diagnosis not present

## 2023-08-04 DIAGNOSIS — E782 Mixed hyperlipidemia: Secondary | ICD-10-CM | POA: Insufficient documentation

## 2023-08-04 LAB — POCT GLYCOSYLATED HEMOGLOBIN (HGB A1C)
HbA1c POC (<> result, manual entry): 5.3 % (ref 4.0–5.6)
Hemoglobin A1C: 5.3 % (ref 4.0–5.6)

## 2023-08-04 NOTE — Progress Notes (Signed)
 New Patient Office Visit  Subjective:   Alexis Jones 06-12-1980 08/04/2023  Chief Complaint  Patient presents with   New Patient (Initial Visit)    Patient is here today to get established with the practice. Possibly wants to have some labs drawn and also wants to talk about weight loss.    HPI: Alexis Jones presents today to establish care at [facility] at [facility]. Introduced to Publishing rights manager role and practice setting.  All questions answered.   Last PCP: Dr. Annabell Key- OBGYN  Last annual physical: Feb 2024- well woman exam  Concerns: See below   HYPERLIPIDEMIA: Alexis Jones presents for the medical management of hyperlipidemia.  Patient's current HLD regimen is: None; diet, exercise  Patient is not currently taking prescribed medications for HLD.  Adhering to heathy diet: Yes  Lab Results  Component Value Date   CHOL 207 (H) 08/02/2023   HDL 31 (L) 08/02/2023   LDLCALC 135 (H) 08/02/2023   TRIG 225 (H) 08/02/2023   CHOLHDL 6.7 (H) 08/02/2023   The 10-year ASCVD risk score (Arnett DK, et al., 2019) is: 1.9%    WEIGHT MANAGEMENT: Alexis Jones presents for weight management.  She has a history of PCOS and insulin  resistance per chart review.  She reports her A1c has always been borderline prediabetic.  Patient has previously been seen by [facility]. She struggles with food prep as she is a single mom of two kids with frequent activities. She is working 2 jobs as well.  Adhering to healthy diet: Yes Regular exercise regimen: Yes, intermittent   Medications tried in the past: Wellbutrin  Wt Readings from Last 3 Encounters:  08/04/23 253 lb (114.8 kg)  04/19/23 250 lb 12.8 oz (113.8 kg)  04/26/22 236 lb 12.8 oz (107.4 kg)     The following portions of the patient's history were reviewed and updated as appropriate: past medical history, past surgical history,  family history, social history, allergies, medications, and problem list.   Patient Active Problem List   Diagnosis Date Noted   History of insulin  resistance 08/04/2023   Cholecystitis 11/25/2021   Depression 11/21/2019   Class 3 severe obesity due to excess calories without serious comorbidity with body mass index (BMI) of 40.0 to 44.9 in adult 08/15/2018   Mixed incontinence 05/18/2018   Rectocele 05/18/2018   Female bladder prolapse 05/18/2018   Left ovarian cyst 04/28/2018   Vitamin D  deficiency 04/28/2018   Microcytic anemia 04/28/2018   BMI 40.0-44.9, adult (HCC) 04/28/2018   Low HDL (under 40) 04/28/2018   Prediabetes 04/28/2018   Past Medical History:  Diagnosis Date   Anxiety    Asthma    Dyspnea    Female bladder prolapse    GERD (gastroesophageal reflux disease)    High triglycerides    HLD (hyperlipidemia)    Left ovarian cyst    Low HDL (under 40)    Lower extremity edema    Microcytic anemia    PCOS (polycystic ovarian syndrome)    Prediabetes    Rectocele    Urinary incontinence    Vitamin D  deficiency    Past Surgical History:  Procedure Laterality Date   BREAST REDUCTION SURGERY  2000   CHOLECYSTECTOMY N/A 11/26/2021   Procedure: LAPAROSCOPIC CHOLECYSTECTOMY;  Surgeon: Joyce Nixon, MD;  Location: [facility];  Service: General;  Laterality: N/A;   LIPOMA EXCISION  2015   left shoulder lipoma removed (2)   REDUCTION MAMMAPLASTY  TEAR DUCT PROBING  04/1979   Family History  Problem Relation Age of Onset   Hypertension Mother    Hyperlipidemia Mother    Thyroid  disease Mother    Anxiety disorder Mother    Obesity Mother    Arthritis Mother    Cancer Father        prostate cancer   Mitral valve prolapse Father    Obesity Father    Breast cancer Maternal Aunt    Ovarian cancer Maternal Grandmother    COPD Maternal Grandmother    Skin cancer Maternal Grandfather    Lung cancer Maternal Grandfather    Social History   Socioeconomic History    Marital status: Divorced    Spouse name: Dicie Foster   Number of children: Not on file   Years of education: Not on file   Highest education level: Master's degree (e.g., MA, MS, MEng, MEd, MSW, MBA)  Occupational History   Occupation: Publishing copy  Tobacco Use   Smoking status: Never   Smokeless tobacco: Never   Tobacco comments:    Never smoked  Vaping Use   Vaping status: Never Used  Substance and Sexual Activity   Alcohol use: No   Drug use: No   Sexual activity: Yes    Birth control/protection: I.U.D.    Comment: mirena  placed 05/05/15  Other Topics Concern   Not on file  Social History Narrative   Not on file   Social Drivers of Health   Financial Resource Strain: Low Risk  (07/28/2023)   Overall Financial Resource Strain (CARDIA)    Difficulty of Paying Living Expenses: Not hard at all  Food Insecurity: No Food Insecurity (07/28/2023)   Hunger Vital Sign    Worried About Running Out of Food in the Last Year: Never true    Ran Out of Food in the Last Year: Never true  Transportation Needs: No Transportation Needs (07/28/2023)   PRAPARE - Administrator, Civil Service (Medical): No    Lack of Transportation (Non-Medical): No  Physical Activity: Insufficiently Active (07/28/2023)   Exercise Vital Sign    Days of Exercise per Week: 1 day    Minutes of Exercise per Session: 10 min  Stress: No Stress Concern Present (07/28/2023)   [employer] of Occupational Health - Occupational Stress Questionnaire    Feeling of Stress : Only a little  Social Connections: Moderately Isolated (07/28/2023)   Social Connection and Isolation Panel [NHANES]    Frequency of Communication with Friends and Family: More than three times a week    Frequency of Social Gatherings with Friends and Family: Once a week    Attends Religious Services: 1 to 4 times per year    Active Member of [employer] or Organizations: No    Attends Engineer, structural: Not on file     Marital Status: Divorced  Intimate Partner Violence: Not At Risk (11/25/2021)   Humiliation, Afraid, Rape, and Kick questionnaire    Fear of Current or Ex-Partner: No    Emotionally Abused: No    Physically Abused: No    Sexually Abused: No   Outpatient Medications Prior to Visit  Medication Sig Dispense Refill   acetaminophen  (TYLENOL ) 325 MG tablet Take 325-650 mg by mouth every 6 (six) hours as needed for headache or mild pain.     CALCIUM PO Take 1,000 mg by mouth daily.     escitalopram  (LEXAPRO ) 10 MG tablet Take 1 tablet (10 mg total) by mouth daily.  90 tablet 3   ibuprofen (ADVIL,MOTRIN) 200 MG tablet Take 200-400 mg by mouth every 8 (eight) hours as needed for headache or mild pain.     levonorgestrel  (MIRENA ) 20 MCG/24HR IUD 1 each by Intrauterine route once. Placed 05/05/15     Multiple Vitamins-Minerals (WOMENS MULTI VITAMIN & MINERAL PO) Take 1 tablet by mouth daily with breakfast.     omeprazole  (PRILOSEC) 40 MG capsule Take 1 capsule (40 mg total) by mouth daily. 90 capsule 3   spironolactone (ALDACTONE) 100 MG tablet Take 100 mg by mouth daily.     No facility-administered medications prior to visit.   No Known Allergies  ROS: A complete ROS was performed with pertinent positives/negatives noted in the HPI. The remainder of the ROS are negative.   Objective:   Today's Vitals   08/04/23 1417  BP: 120/73  Pulse: 70  SpO2: 99%  Weight: 253 lb (114.8 kg)  Height: 5\' 6"  (1.676 m)    GENERAL: Well-appearing, in NAD. Well nourished.  SKIN: Pink, warm and dry.  Head: Normocephalic. NECK: Trachea midline. Full ROM w/o pain or tenderness. RESPIRATORY: Chest wall symmetrical. Respirations even and non-labored. Breath sounds clear to auscultation bilaterally.  CARDIAC: S1, S2 present, regular rate and rhythm without murmur or gallops. Peripheral pulses 2+ bilaterally.  MSK: Muscle tone and strength appropriate for age.  NEUROLOGIC: No motor or sensory deficits. Steady,  even gait. C2-C12 intact.  PSYCH/MENTAL STATUS: Alert, oriented x 3. Cooperative, appropriate mood and affect.    Health Maintenance Due  Topic Date Due   HIV Screening  Never done   Hepatitis C Screening  Never done   COVID-19 Vaccine (3 - 2024-25 season) 10/31/2022   Colonoscopy  Never done    Results for orders placed or performed in visit on 08/04/23  POCT glycosylated hemoglobin (Hb A1C)  Result Value Ref Range   Hemoglobin A1C 5.3 4.0 - 5.6 %   HbA1c POC (<> result, manual entry) 5.3 4.0 - 5.6 %   HbA1c, POC (prediabetic range)     HbA1c, POC (controlled diabetic range)         Assessment & Plan:  1. Class 3 severe obesity due to excess calories without serious comorbidity with body mass index (BMI) of 40.0 to 44.9 in adult (Primary) We discussed significant dietary changes, lifestyle and rec exercise recommendations given PCOS and insulin  resistance as underlying conditions.  Patient would like to try compound GLP-[facility] solution [facility] [place].  We discussed the possible adverse and side effects of this medication and patient was able to verbalize understanding and agreement.  No family history or personal history of medullary thyroid  cancer or M EN type II.  Will start compounded solution and titrate as directed.  2. Screening for colon cancer [place] placed to [facility] screening colonoscopy. - Ambulatory referral to Gastroenterology  3. History of insulin  resistance A1c 5.3.  We discussed ways to lower insulin  resistance, cortisol, and ways to help with dietary changes and exercise.  Will likely recheck insulin  with next appointment. - POCT glycosylated hemoglobin (Hb A1C)  4. Mixed hyperlipidemia Currently uncontrolled.  ASCVD risk is 1.9%.  Patient would like to try lifestyle changes first and repeat in approximately 3 to 6 months.  PCP agreeable.    Patient to reach out to office if new, worrisome, or unresolved symptoms arise or  if no improvement in patient's condition. Patient verbalized understanding and is agreeable to treatment plan. All questions answered to  patient's satisfaction.    Return in about 3 months (around 11/04/2023) for follow up HLD, weight management.    Nonda Bays, [place]

## 2023-08-04 NOTE — Progress Notes (Signed)
 A1C is unremarkable for prediabetes.

## 2023-08-04 NOTE — Patient Instructions (Signed)
 [email or web address] [address] [place], [place], [place] [address]  25 mi [phone] Open  Closes 5:30 PM

## 2023-10-19 IMAGING — MG MM DIGITAL DIAGNOSTIC UNILAT*L* W/ TOMO W/ CAD
4 series · 4 of 12 positions shown · non-contrast
Comparison: Previous exam(s).

CLINICAL DATA: 41-year-old female presenting for six-month
follow-up of a probably benign asymmetry in the outer left breast.
Stereotactic core needle biopsy of this area was attempted in
October 2020 but the area could not be reproduced at the time of
biopsy.

EXAM:
DIGITAL DIAGNOSTIC UNILATERAL LEFT MAMMOGRAM WITH TOMOSYNTHESIS AND
CAD
TECHNIQUE: Left digital diagnostic mammography and breast tomosynthesis was
performed. The images were evaluated with computer-aided detection.

[L MLO synth-2D]
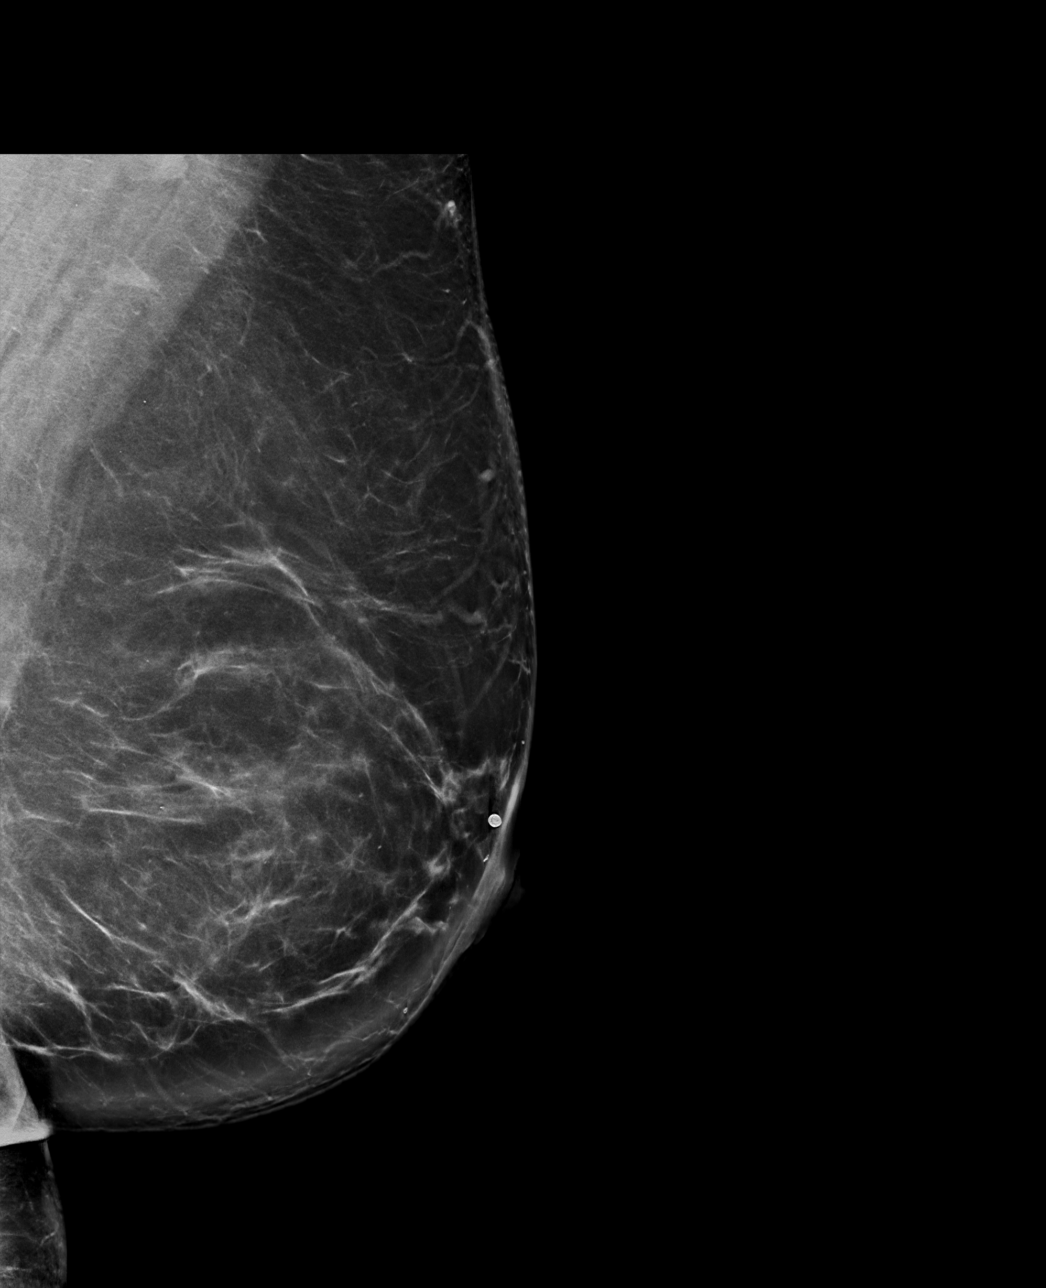

[L CC synth-2D]
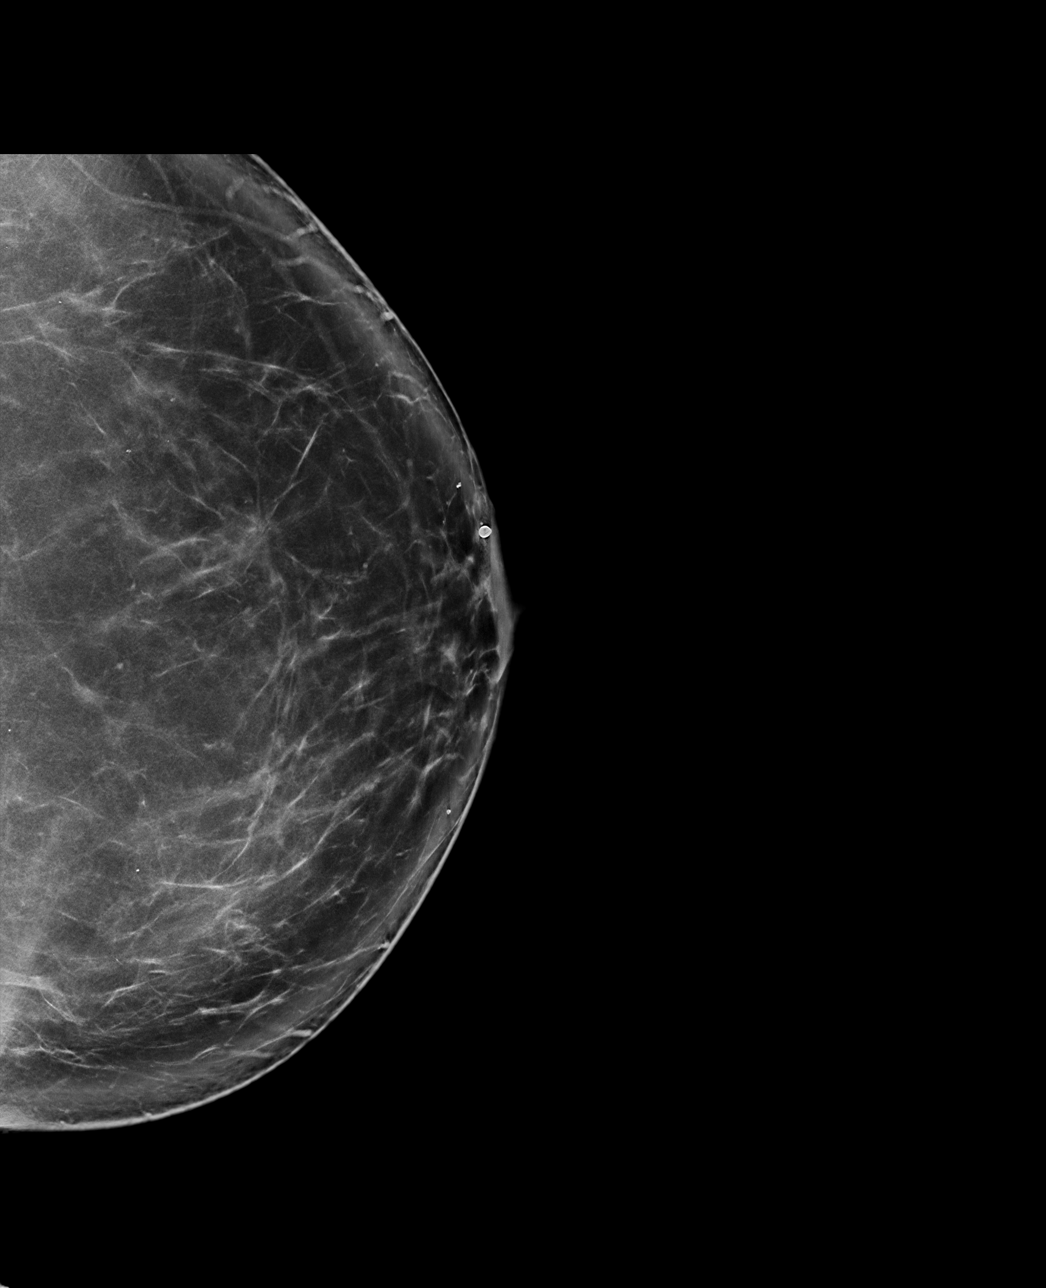

[L MLO tomo · tomo slice 53/105.0]
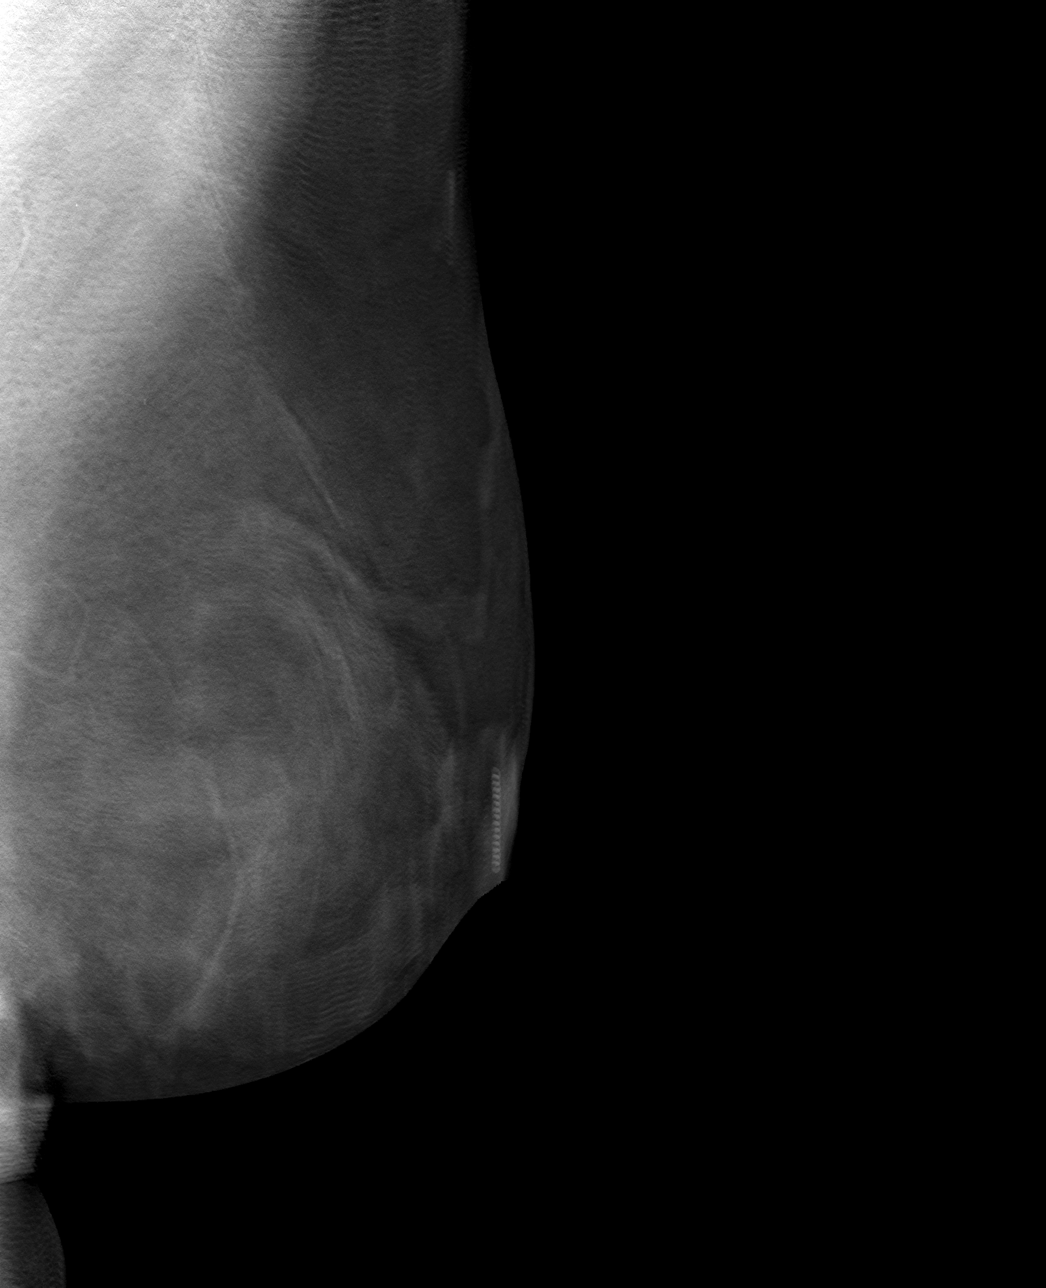

[L CC tomo · tomo slice 52/103.0]
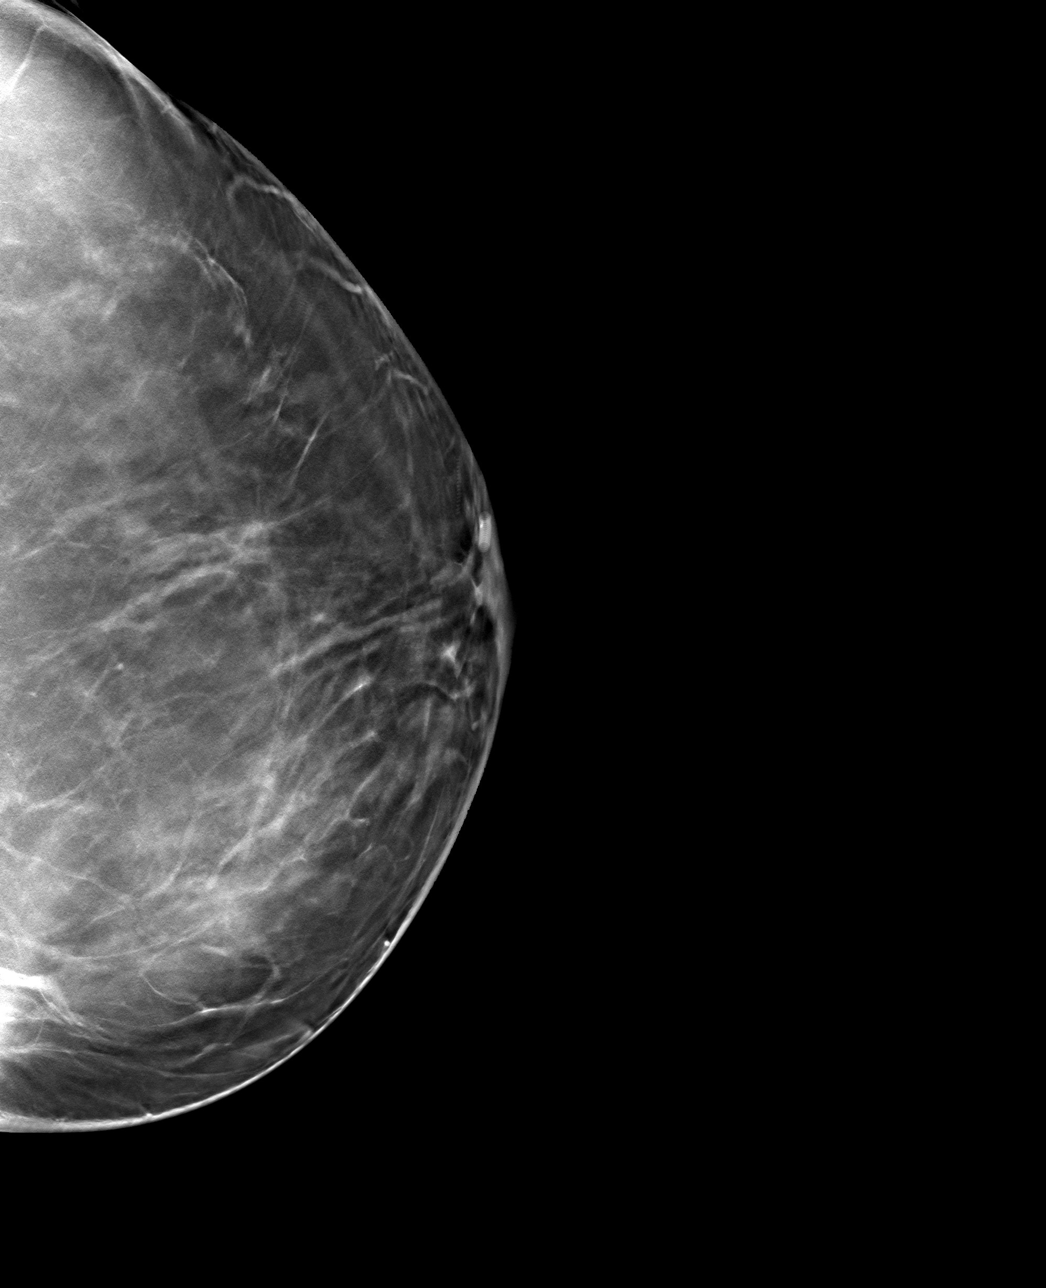

[4 of 12 positions shown; findings below may reference images not displayed]

ACR Breast Density Category b: There are scattered areas of
fibroglandular density.
FINDINGS: Full field tomosynthesis views of the left breast were performed.
There is a stable small asymmetry in the outer left breast. There
are no new suspicious findings elsewhere in the left breast.
IMPRESSION: Stable probably benign asymmetry in the outer left breast.

RECOMMENDATION:
Diagnostic bilateral mammogram in 6 months.

I have discussed the findings and recommendations with the patient.
If applicable, a reminder letter will be sent to the patient
regarding the next appointment.

BI-RADS CATEGORY  3: Probably benign.

## 2023-11-07 ENCOUNTER — Ambulatory Visit (HOSPITAL_BASED_OUTPATIENT_CLINIC_OR_DEPARTMENT_OTHER): Admitting: Family Medicine

## 2023-11-07 ENCOUNTER — Encounter (HOSPITAL_BASED_OUTPATIENT_CLINIC_OR_DEPARTMENT_OTHER): Payer: Self-pay | Admitting: Family Medicine

## 2023-11-07 VITALS — BP 118/69 | HR 64 | Ht 66.0 in | Wt 246.4 lb

## 2023-11-07 DIAGNOSIS — E782 Mixed hyperlipidemia: Secondary | ICD-10-CM | POA: Diagnosis not present

## 2023-11-07 DIAGNOSIS — Z0000 Encounter for general adult medical examination without abnormal findings: Secondary | ICD-10-CM

## 2023-11-07 DIAGNOSIS — E669 Obesity, unspecified: Secondary | ICD-10-CM | POA: Diagnosis not present

## 2023-11-07 NOTE — Progress Notes (Signed)
 Subjective:   Alexis Jones 28-Jun-1980 11/07/2023  Chief Complaint  Patient presents with   Medical Management of Chronic Issues    39-month follow up; denies any main concerns for today's visit.    HPI: Alexis Jones presents today for re-assessment and management of chronic medical conditions.   WEIGHT MANAGEMENT: Alexis Jones presents for weight management. She is currently taking Tirzepatide and finished her first vial without side effects. She has been using Tirzepatide 25 units weekly for the past 3 months. She is using protein shakes in the morning, grilled proteins at lunch and dinner with her kids.  Current dietary plan: Low Carb, High Protein  Regular exercise regimen: None currently   Wt Readings from Last 3 Encounters:  11/07/23 246 lb 6.4 oz (111.8 kg)  08/04/23 253 lb (114.8 kg)  04/19/23 250 lb 12.8 oz (113.8 kg)     HYPERLIPIDEMIA: Alexis Jones presents for the medical management of hyperlipidemia.  Patient's current HLD regimen is: Diet, exercise  Patient is not currently taking prescribed medications for HLD.  Adhering to heathy diet: Yes Exercising regularly: Not currently w/ dedicated exercise  Denies myalgias.  Lab Results  Component Value Date   CHOL 207 (H) 08/02/2023   HDL 31 (L) 08/02/2023   LDLCALC 135 (H) 08/02/2023   TRIG 225 (H) 08/02/2023   CHOLHDL 6.7 (H) 08/02/2023     The following portions of the patient's history were reviewed and updated as appropriate: past medical history, past surgical history, family history, social history, allergies, medications, and problem list.   Patient Active Problem List   Diagnosis Date Noted   Obesity (BMI 30-39.9) 11/07/2023   History of insulin  resistance 08/04/2023   Mixed hyperlipidemia 08/04/2023   Cholecystitis 11/25/2021   Depression 11/21/2019   Class 3 severe obesity due to excess calories without serious comorbidity with body mass index (BMI) of  40.0 to 44.9 in adult 08/15/2018   Mixed incontinence 05/18/2018   Rectocele 05/18/2018   Female bladder prolapse 05/18/2018   Left ovarian cyst 04/28/2018   Vitamin D  deficiency 04/28/2018   Microcytic anemia 04/28/2018   BMI 40.0-44.9, adult (HCC) 04/28/2018   Low HDL (under 40) 04/28/2018   Prediabetes 04/28/2018   Past Medical History:  Diagnosis Date   Anxiety    Asthma    Dyspnea    Female bladder prolapse    GERD (gastroesophageal reflux disease)    High triglycerides    HLD (hyperlipidemia)    Left ovarian cyst    Low HDL (under 40)    Lower extremity edema    Microcytic anemia    PCOS (polycystic ovarian syndrome)    Prediabetes    Rectocele    Urinary incontinence    Vitamin D  deficiency    Past Surgical History:  Procedure Laterality Date   BREAST REDUCTION SURGERY  2000   CHOLECYSTECTOMY N/A 11/26/2021   Procedure: LAPAROSCOPIC CHOLECYSTECTOMY;  Surgeon: Debby Hila, MD;  Location: [facility];  Service: General;  Laterality: N/A;   LIPOMA EXCISION  2015   left shoulder lipoma removed (2)   REDUCTION MAMMAPLASTY     TEAR DUCT PROBING  04/1979   Family History  Problem Relation Age of Onset   Hypertension Mother    Hyperlipidemia Mother    Thyroid  disease Mother    Anxiety disorder Mother    Obesity Mother    Arthritis Mother    Cancer Father        prostate cancer   Mitral valve prolapse  Father    Obesity Father    Breast cancer Maternal Aunt    Ovarian cancer Maternal Grandmother    COPD Maternal Grandmother    Skin cancer Maternal Grandfather    Lung cancer Maternal Grandfather    Outpatient Medications Prior to Visit  Medication Sig Dispense Refill   acetaminophen  (TYLENOL ) 325 MG tablet Take 325-650 mg by mouth every 6 (six) hours as needed for headache or mild pain.     CALCIUM PO Take 1,000 mg by mouth daily.     escitalopram  (LEXAPRO ) 10 MG tablet Take 1 tablet (10 mg total) by mouth daily. 90 tablet 3   ibuprofen (ADVIL,MOTRIN) 200 MG  tablet Take 200-400 mg by mouth every 8 (eight) hours as needed for headache or mild pain.     levonorgestrel  (MIRENA ) 20 MCG/24HR IUD 1 each by Intrauterine route once. Placed 05/05/15     Multiple Vitamins-Minerals (WOMENS MULTI VITAMIN & MINERAL PO) Take 1 tablet by mouth daily with breakfast.     omeprazole  (PRILOSEC) 40 MG capsule Take 1 capsule (40 mg total) by mouth daily. 90 capsule 3   spironolactone (ALDACTONE) 100 MG tablet Take 100 mg by mouth daily.     No facility-administered medications prior to visit.   No Known Allergies   ROS: A complete ROS was performed with pertinent positives/negatives noted in the HPI. The remainder of the ROS are negative.    Objective:   Today's Vitals   11/07/23 1529  BP: 118/69  Pulse: 64  SpO2: 97%  Weight: 246 lb 6.4 oz (111.8 kg)  Height: 5' 6 (1.676 m)    Physical Exam   GENERAL: Well-appearing, in NAD. Well nourished.  SKIN: Pink, warm and dry.  Head: Normocephalic. NECK: Trachea midline. Full ROM w/o pain or tenderness.  RESPIRATORY: Chest wall symmetrical. Respirations even and non-labored.  EXTREMITIES: Without clubbing, cyanosis, or edema.  NEUROLOGIC: No motor or sensory deficits. Steady, even gait. C2-C12 intact.  PSYCH/MENTAL STATUS: Alert, oriented x 3. Cooperative, appropriate mood and affect.      Assessment & Plan:  1. Mixed hyperlipidemia (Primary) Patient doing well with diet. Recommend increasing daily activity and exercise.  - Lipid panel; Future  2. Obesity (BMI 30-39.9) Doing well with [facility] solution Tirzepatide. Discussed dosing recommendations, diet and exercise. Will check electrolytes, LFT with labs when fasting. Will continue current regimen.  - Comprehensive metabolic panel with GFR; Future  3. Healthcare maintenance PCP recommend patient complete cscope. Referral placed previously by PCP. She will call to schedule    No orders of the defined types were placed in this  encounter.  Lab Orders         Comprehensive metabolic panel with GFR         Lipid panel      Return in about 4 months (around 03/08/2024) for ANNUAL PHYSICAL, Weight .    Patient to reach out to office if new, worrisome, or unresolved symptoms arise or if no improvement in patient's condition. Patient verbalized understanding and is agreeable to treatment plan. All questions answered to patient's satisfaction.    Thersia Schuyler Stark, [place]

## 2023-11-07 NOTE — Patient Instructions (Signed)
 Please return for fasting blood work.  For fasting, if your blood work is in the morning please do not eat any food after midnight.  You may have water or black coffee prior to your lab work.  Please take all regularly prescribed medications even if you are fasting.  If your blood work is in the afternoon, please fast for at least 5 to 6 hours.  You may continue to drink water and/or black coffee prior to your lab work.  Please take all scheduled medications even if you are fasting.

## 2023-11-14 ENCOUNTER — Other Ambulatory Visit (HOSPITAL_BASED_OUTPATIENT_CLINIC_OR_DEPARTMENT_OTHER): Payer: Self-pay | Admitting: *Deleted

## 2023-11-14 DIAGNOSIS — E669 Obesity, unspecified: Secondary | ICD-10-CM

## 2023-11-14 DIAGNOSIS — E782 Mixed hyperlipidemia: Secondary | ICD-10-CM

## 2023-11-15 ENCOUNTER — Ambulatory Visit (HOSPITAL_BASED_OUTPATIENT_CLINIC_OR_DEPARTMENT_OTHER): Payer: Self-pay | Admitting: Family Medicine

## 2023-11-15 LAB — COMPREHENSIVE METABOLIC PANEL WITH GFR
ALT: 23 IU/L (ref 0–32)
AST: 18 IU/L (ref 0–40)
Albumin: 4.3 g/dL (ref 3.9–4.9)
Alkaline Phosphatase: 68 IU/L (ref 41–116)
BUN/Creatinine Ratio: 9 (ref 9–23)
BUN: 8 mg/dL (ref 6–24)
Bilirubin Total: 0.3 mg/dL (ref 0.0–1.2)
CO2: 21 mmol/L (ref 20–29)
Calcium: 9.1 mg/dL (ref 8.7–10.2)
Chloride: 104 mmol/L (ref 96–106)
Creatinine, Ser: 0.86 mg/dL (ref 0.57–1.00)
Globulin, Total: 2.1 g/dL (ref 1.5–4.5)
Glucose: 88 mg/dL (ref 70–99)
Potassium: 4.1 mmol/L (ref 3.5–5.2)
Sodium: 141 mmol/L (ref 134–144)
Total Protein: 6.4 g/dL (ref 6.0–8.5)
eGFR: 85 mL/min/1.73 (ref >59)

## 2023-11-15 LAB — LIPID PANEL
Chol/HDL Ratio: 5.9 ratio — ABNORMAL HIGH (ref 0.0–4.4)
Cholesterol, Total: 189 mg/dL (ref 100–199)
HDL: 32 mg/dL — ABNORMAL LOW (ref >39)
LDL Chol Calc (NIH): 129 mg/dL — ABNORMAL HIGH (ref 0–99)
Triglycerides: 156 mg/dL — ABNORMAL HIGH (ref 0–149)
VLDL Cholesterol Cal: 28 mg/dL (ref 5–40)

## 2023-11-15 NOTE — Progress Notes (Signed)
 Hi Sanaia,  Your total cholesterol, LDL, and triglycerides have improved greatly since 3 months prior! Keep up the good work!

## 2023-12-07 ENCOUNTER — Encounter: Payer: Self-pay | Admitting: Gastroenterology

## 2024-01-25 ENCOUNTER — Ambulatory Visit (AMBULATORY_SURGERY_CENTER)

## 2024-01-25 VITALS — Ht 66.0 in | Wt 235.0 lb

## 2024-01-25 DIAGNOSIS — Z1211 Encounter for screening for malignant neoplasm of colon: Secondary | ICD-10-CM

## 2024-01-25 MED ORDER — NA SULFATE-K SULFATE-MG SULF 17.5-3.13-1.6 GM/177ML PO SOLN: 1.0000 | Freq: Once | ORAL | 0 refills | Status: AC

## 2024-01-25 NOTE — Progress Notes (Signed)
 No issues known to pt with past sedation with any surgeries or procedures Patient denies ever being told they had issues or difficulty with intubation  No FH of Malignant Hyperthermia Pt is not on diet pills; does take GLP-1 medication compounded by pharmacy in [place]; pyridthiam  .5mL /weekly Pt is not on home 02  Pt is not on blood thinners  Pt denies issues with chronic constipation  No A fib or A flutter Have any cardiac testing pending--no Pt instructed to use Singlecare.com or GoodRx for a price reduction on prep  Ambulates independently

## 2024-02-01 ENCOUNTER — Encounter: Payer: Self-pay | Admitting: Gastroenterology

## 2024-02-07 ENCOUNTER — Ambulatory Visit: Admitting: Gastroenterology

## 2024-02-07 ENCOUNTER — Encounter: Payer: Self-pay | Admitting: Gastroenterology

## 2024-02-07 VITALS — BP 119/70 | HR 63 | Temp 97.5°F | Resp 13 | Ht 66.0 in | Wt 235.0 lb

## 2024-02-07 DIAGNOSIS — K635 Polyp of colon: Secondary | ICD-10-CM | POA: Diagnosis not present

## 2024-02-07 DIAGNOSIS — Z1211 Encounter for screening for malignant neoplasm of colon: Secondary | ICD-10-CM

## 2024-02-07 DIAGNOSIS — D125 Benign neoplasm of sigmoid colon: Secondary | ICD-10-CM

## 2024-02-07 MED ORDER — SODIUM CHLORIDE 0.9 % IV SOLN: 500.0000 mL | Freq: Once | INTRAVENOUS | Status: AC

## 2024-02-07 NOTE — Patient Instructions (Signed)
 YOU HAD AN ENDOSCOPIC PROCEDURE TODAY AT THE [facility]:   Refer to the procedure report that was given to you for any specific questions about what was found during the examination.  If the procedure report does not answer your questions, please call your gastroenterologist to clarify.  If you requested that your care partner not be given the details of your procedure findings, then the procedure report has been included in a sealed envelope for you to review at your convenience later.  YOU SHOULD EXPECT: Some feelings of bloating in the abdomen. Passage of more gas than usual.  Walking can help get rid of the air that was put into your GI tract during the procedure and reduce the bloating. If you had a lower endoscopy (such as a colonoscopy or flexible sigmoidoscopy) you may notice spotting of blood in your stool or on the toilet paper. If you underwent a bowel prep for your procedure, you may not have a normal bowel movement for a few days.  Please Note:  You might notice some irritation and congestion in your nose or some drainage.  This is from the oxygen used during your procedure.  There is no need for concern and it should clear up in a day or so.  SYMPTOMS TO REPORT IMMEDIATELY:  Following lower endoscopy (colonoscopy or flexible sigmoidoscopy):  Excessive amounts of blood in the stool  Significant tenderness or worsening of abdominal pains  Swelling of the abdomen that is new, acute  Fever of 100F or higher   For urgent or emergent issues, a gastroenterologist can be reached at any hour by calling [phone]. Do not use MyChart messaging for urgent concerns.    DIET:  We do recommend a small meal at first, but then you may proceed to your regular diet.  Drink plenty of fluids but you should avoid alcoholic beverages for 24 hours.  MEDICATIONS: Continue present medications.   FOLLOW UP: Await pathology results. Repeat colonoscopy in 7-10 years for surveillance based  on pathology results.  Educational handouts given to patient: Polyps, Diverticulosis, Hemorrhoids.  Thank you for allowing us  to provide for your healthcare needs today.  ACTIVITY:  You should plan to take it easy for the rest of today and you should NOT DRIVE or use heavy machinery until tomorrow (because of the sedation medicines used during the test).    FOLLOW UP: Our staff will call the number listed on your records the next business day following your procedure.  We will call around 7:15- 8:00 am to check on you and address any questions or concerns that you may have regarding the information given to you following your procedure. If we do not reach you, we will leave a message.     If any biopsies were taken you will be contacted by phone or by letter within the next 1-3 weeks.  Please call us  at [phone] if you have not heard about the biopsies in 3 weeks.    SIGNATURES/CONFIDENTIALITY: You and/or your care partner have signed paperwork which will be entered into your electronic medical record.  These signatures attest to the fact that that the information above on your After Visit Summary has been reviewed and is understood.  Full responsibility of the confidentiality of this discharge information lies with you and/or your care-partner.

## 2024-02-07 NOTE — Progress Notes (Unsigned)
 Vss nad trans to pacu

## 2024-02-07 NOTE — Progress Notes (Unsigned)
 [facility] History and Physical   Primary Care Physician:  Knute Thersia Bitters, FNP   Reason for Procedure:  Colorectal cancer screening  Plan:    Screening colonoscopy with possible interventions as needed     HPI: Alexis Jones is a very pleasant 43 y.o. female here for screening colonoscopy. Denies any nausea, vomiting, abdominal pain, melena or bright red blood per rectum  The risks and benefits as well as alternatives of endoscopic procedure(s) have been discussed and reviewed.  The patient was provided an opportunity to ask questions and all were answered. The patient agreed with the plan and demonstrated an understanding of the instructions.   Past Medical History:  Diagnosis Date   Allergy    Anxiety    Asthma    Dyspnea    Female bladder prolapse    GERD (gastroesophageal reflux disease)    High triglycerides    HLD (hyperlipidemia)    Left ovarian cyst    Low HDL (under 40)    Lower extremity edema    Microcytic anemia    PCOS (polycystic ovarian syndrome)    Prediabetes    Rectocele    Urinary incontinence    Vitamin D  deficiency     Past Surgical History:  Procedure Laterality Date   BREAST REDUCTION SURGERY  2000   CHOLECYSTECTOMY N/A 11/26/2021   Procedure: LAPAROSCOPIC CHOLECYSTECTOMY;  Surgeon: Debby Hila, MD;  Location: [facility];  Service: General;  Laterality: N/A;   LIPOMA EXCISION  2015   left shoulder lipoma removed (2)   REDUCTION MAMMAPLASTY     TEAR DUCT PROBING  04/1979    Prior to Admission medications   Medication Sig Start Date End Date Taking? Authorizing Provider  cetirizine (ZYRTEC) 5 MG chewable tablet Chew 5 mg by mouth daily as needed for allergies.   Yes [provider]  escitalopram  (LEXAPRO ) 10 MG tablet Take 1 tablet (10 mg total) by mouth daily. 04/19/23  Yes Cleotilde Ronal RAMAN, MD  levonorgestrel  (MIRENA ) 20 MCG/24HR IUD 1 each by Intrauterine route once. Placed 05/05/15   Yes [provider]  Multiple Vitamins-Minerals (WOMENS MULTI VITAMIN & MINERAL PO) Take 1 tablet by mouth daily with breakfast.   Yes [provider]  omeprazole  (PRILOSEC) 40 MG capsule Take 1 capsule (40 mg total) by mouth daily. 04/19/23  Yes Cleotilde Ronal RAMAN, MD  spironolactone (ALDACTONE) 100 MG tablet Take 100 mg by mouth daily. 01/04/22  Yes [provider]  acetaminophen  (TYLENOL ) 325 MG tablet Take 325-650 mg by mouth every 6 (six) hours as needed for headache or mild pain.    [provider]  CALCIUM PO Take 1,000 mg by mouth daily.    [provider]  ibuprofen (ADVIL,MOTRIN) 200 MG tablet Take 200-400 mg by mouth every 8 (eight) hours as needed for headache or mild pain.    [provider]  tirzepatide (ZEPBOUND) 10 MG/0.5ML Pen Inject 10 mg into the skin once a week.    [provider]    Current Outpatient Medications  Medication Sig Dispense Refill   cetirizine (ZYRTEC) 5 MG chewable tablet Chew 5 mg by mouth daily as needed for allergies.     escitalopram  (LEXAPRO ) 10 MG tablet Take 1 tablet (10 mg total) by mouth daily. 90 tablet 3   levonorgestrel  (MIRENA ) 20 MCG/24HR IUD 1 each by Intrauterine route once. Placed 05/05/15     Multiple Vitamins-Minerals (WOMENS MULTI VITAMIN & MINERAL PO) Take 1 tablet by mouth daily with breakfast.  omeprazole  (PRILOSEC) 40 MG capsule Take 1 capsule (40 mg total) by mouth daily. 90 capsule 3   spironolactone (ALDACTONE) 100 MG tablet Take 100 mg by mouth daily.     acetaminophen  (TYLENOL ) 325 MG tablet Take 325-650 mg by mouth every 6 (six) hours as needed for headache or mild pain.     CALCIUM PO Take 1,000 mg by mouth daily.     ibuprofen (ADVIL,MOTRIN) 200 MG tablet Take 200-400 mg by mouth every 8 (eight) hours as needed for headache or mild pain.     tirzepatide (ZEPBOUND) 10 MG/0.5ML Pen Inject 10 mg into the skin once a week.     Current Facility-Administered Medications  Medication Dose  Route Frequency Provider Last Rate Last Admin   0.9 %  sodium chloride  infusion  500 mL Intravenous Once Catherina Pates V, MD        Allergies as of 02/07/2024   (No Known Allergies)    Family History  Problem Relation Age of Onset   Hypertension Mother    Hyperlipidemia Mother    Thyroid  disease Mother    Anxiety disorder Mother    Obesity Mother    Arthritis Mother    Cancer Father        prostate cancer   Mitral valve prolapse Father    Obesity Father    Colon polyps Maternal Aunt    Breast cancer Maternal Aunt    Ovarian cancer Maternal Grandmother    COPD Maternal Grandmother    Skin cancer Maternal Grandfather    Lung cancer Maternal Grandfather    Stomach cancer Other    Rectal cancer Other    Colon cancer Neg Hx    Esophageal cancer Neg Hx     Social History   Socioeconomic History   Marital status: Divorced    Spouse name: Fairy Sharps   Number of children: Not on file   Years of education: Not on file   Highest education level: Master's degree (e.g., MA, MS, MEng, MEd, MSW, MBA)  Occupational History   Occupation: Publishing Copy  Tobacco Use   Smoking status: Never   Smokeless tobacco: Never   Tobacco comments:    Never smoked  Vaping Use   Vaping status: Never Used  Substance and Sexual Activity   Alcohol use: No   Drug use: No   Sexual activity: Yes    Birth control/protection: I.U.D.    Comment: mirena  placed 05/05/15  Other Topics Concern   Not on file  Social History Narrative   Not on file   Social Drivers of Health   Financial Resource Strain: Low Risk  (10/31/2023)   Overall Financial Resource Strain (CARDIA)    Difficulty of Paying Living Expenses: Not hard at all  Food Insecurity: No Food Insecurity (10/31/2023)   Hunger Vital Sign    Worried About Running Out of Food in the Last Year: Never true    Ran Out of Food in the Last Year: Never true  Transportation Needs: No Transportation Needs (10/31/2023)   PRAPARE -  Administrator, Civil Service (Medical): No    Lack of Transportation (Non-Medical): No  Physical Activity: Insufficiently Active (10/31/2023)   Exercise Vital Sign    Days of Exercise per Week: 2 days    Minutes of Exercise per Session: 10 min  Stress: No Stress Concern Present (10/31/2023)   [employer] of Occupational Health - Occupational Stress Questionnaire    Feeling of Stress: Only a little  Social  Connections: Socially Isolated (10/31/2023)   Social Connection and Isolation Panel    Frequency of Communication with Friends and Family: Three times a week    Frequency of Social Gatherings with Friends and Family: Once a week    Attends Religious Services: Never    Database Administrator or Organizations: No    Attends Engineer, Structural: Not on file    Marital Status: Divorced  Intimate Partner Violence: Not At Risk (11/25/2021)   Humiliation, Afraid, Rape, and Kick questionnaire    Fear of Current or Ex-Partner: No    Emotionally Abused: No    Physically Abused: No    Sexually Abused: No    Review of Systems:  All other review of systems negative except as mentioned in the HPI.  Physical Exam: Vital signs in last 24 hours: BP (!) 113/59   Pulse 74   Temp (!) 97.5 F (36.4 C)   Ht 5' 6 (1.676 m)   Wt 235 lb (106.6 kg)   SpO2 98%   BMI 37.93 kg/m  General:   Alert, NAD Lungs:  Clear .   Heart:  Regular rate and rhythm Abdomen:  Soft, nontender and nondistended. Neuro/Psych:  Alert and cooperative. Normal mood and affect. A and O x 3  Reviewed labs, radiology imaging, old records and pertinent past GI work up  Patient is appropriate for planned procedure(s) and anesthesia in an ambulatory setting   K. Veena Addley Ballinger , MD [phone]

## 2024-02-07 NOTE — Progress Notes (Unsigned)
 Pt's states no medical or surgical changes since previsit or office visit.

## 2024-02-07 NOTE — Op Note (Signed)
 [facility] Patient Name: Alexis Jones Procedure Date: 02/07/2024 10:33 AM MRN: [id number] Endoscopist: Gustav ALONSO Mcgee , MD, [id number] Age: 43 Referring MD:  Date of Birth: 1980/12/12 Gender: Female Account #: [id number] Procedure:                Colonoscopy Indications:              Screening for colorectal malignant neoplasm Medicines:                Monitored Anesthesia Care Procedure:                Pre-Anesthesia Assessment:                           - Prior to the procedure, a History and Physical                            was performed, and patient medications and                            allergies were reviewed. The patient's tolerance of                            previous anesthesia was also reviewed. The risks                            and benefits of the procedure and the sedation                            options and risks were discussed with the patient.                            All questions were answered, and informed consent                            was obtained. Prior Anticoagulants: The patient has                            taken no anticoagulant or antiplatelet agents. ASA                            Grade Assessment: II - A patient with mild systemic                            disease. After reviewing the risks and benefits,                            the patient was deemed in satisfactory condition to                            undergo the procedure.                           After obtaining informed consent, the colonoscope  was passed under direct vision. Throughout the                            procedure, the patient's blood pressure, pulse, and                            oxygen saturations were monitored continuously. The                            Olympus Scope SN [id number] was introduced through the                            anus and advanced to the the cecum, identified by                             appendiceal orifice and ileocecal valve. The                            colonoscopy was performed without difficulty. The                            patient tolerated the procedure well. The quality                            of the bowel preparation was good. The ileocecal                            valve, appendiceal orifice, and rectum were                            photographed. Scope In: 10:49:37 AM Scope Out: 11:14:52 AM Scope Withdrawal Time: 0 hours 17 minutes 33 seconds  Total Procedure Duration: 0 hours 25 minutes 15 seconds  Findings:                 The perianal and digital rectal examinations were                            normal.                           A 4 mm polyp was found in the sigmoid colon. The                            polyp was sessile. The polyp was removed with a                            cold snare. Resection and retrieval were complete.                           A few small-mouthed diverticula were found in the                            sigmoid colon.  Non-bleeding external and internal hemorrhoids were                            found during retroflexion. The hemorrhoids were                            small. Complications:            No immediate complications. Estimated Blood Loss:     Estimated blood loss was minimal. Impression:               - One 4 mm polyp in the sigmoid colon, removed with                            a cold snare. Resected and retrieved.                           - Diverticulosis in the sigmoid colon.                           - Non-bleeding external and internal hemorrhoids. Recommendation:           - Resume previous diet.                           - Continue present medications.                           - Await pathology results.                           - Repeat colonoscopy in 7-10 years for surveillance                            based on pathology results. Larnell Granlund V. Hattye Siegfried, MD 02/07/2024  11:20:52 AM This report has been signed electronically.

## 2024-02-07 NOTE — Progress Notes (Unsigned)
 Called to room to assist during endoscopic procedure.  Patient ID and intended procedure confirmed with present staff. Received instructions for my participation in the procedure from the performing physician.

## 2024-02-08 ENCOUNTER — Telehealth: Payer: Self-pay | Admitting: *Deleted

## 2024-02-08 NOTE — Telephone Encounter (Signed)
 No answer on follow up call. Unable to leave VM

## 2024-02-09 LAB — SURGICAL PATHOLOGY

## 2024-03-08 ENCOUNTER — Encounter (HOSPITAL_BASED_OUTPATIENT_CLINIC_OR_DEPARTMENT_OTHER): Admitting: Family Medicine

## 2024-03-30 ENCOUNTER — Ambulatory Visit: Payer: Self-pay | Admitting: Gastroenterology

## 2024-05-16 ENCOUNTER — Encounter (HOSPITAL_BASED_OUTPATIENT_CLINIC_OR_DEPARTMENT_OTHER): Admitting: Family Medicine
